# Patient Record
Sex: Female | Born: 1980
Health system: Southern US, Community
[De-identification: ages and names within clinical notes are randomized; demographics above are authoritative.]

## PROBLEM LIST (undated history)

## (undated) DIAGNOSIS — G35 Multiple sclerosis: Secondary | ICD-10-CM

## (undated) DIAGNOSIS — F418 Other specified anxiety disorders: Secondary | ICD-10-CM

## (undated) DIAGNOSIS — G43909 Migraine, unspecified, not intractable, without status migrainosus: Secondary | ICD-10-CM

## (undated) HISTORY — DX: Multiple sclerosis: G35

## (undated) HISTORY — DX: Migraine, unspecified, not intractable, without status migrainosus: G43.909

## (undated) HISTORY — DX: Other specified anxiety disorders: F41.8

---

## 1999-03-29 ENCOUNTER — Emergency Department (HOSPITAL_COMMUNITY): Admission: EM | Admit: 1999-03-29 | Discharge: 1999-03-29 | Payer: Self-pay | Admitting: Emergency Medicine

## 1999-10-27 ENCOUNTER — Emergency Department (HOSPITAL_COMMUNITY): Admission: EM | Admit: 1999-10-27 | Discharge: 1999-10-27 | Payer: Self-pay | Admitting: Emergency Medicine

## 1999-12-03 ENCOUNTER — Emergency Department (HOSPITAL_COMMUNITY): Admission: EM | Admit: 1999-12-03 | Discharge: 1999-12-03 | Payer: Self-pay | Admitting: Emergency Medicine

## 1999-12-12 ENCOUNTER — Encounter: Payer: Self-pay | Admitting: Orthopedic Surgery

## 1999-12-12 ENCOUNTER — Ambulatory Visit (HOSPITAL_COMMUNITY): Admission: RE | Admit: 1999-12-12 | Discharge: 1999-12-12 | Payer: Self-pay | Admitting: Orthopedic Surgery

## 2019-04-03 ENCOUNTER — Other Ambulatory Visit: Payer: Self-pay

## 2019-04-03 ENCOUNTER — Telehealth: Payer: Self-pay | Admitting: *Deleted

## 2019-04-03 ENCOUNTER — Encounter: Payer: Self-pay | Admitting: Neurology

## 2019-04-03 ENCOUNTER — Ambulatory Visit (INDEPENDENT_AMBULATORY_CARE_PROVIDER_SITE_OTHER): Payer: Medicare HMO | Admitting: Neurology

## 2019-04-03 VITALS — BP 105/70 | HR 56 | Ht 67.0 in | Wt 169.0 lb

## 2019-04-03 DIAGNOSIS — G35 Multiple sclerosis: Secondary | ICD-10-CM | POA: Diagnosis not present

## 2019-04-03 DIAGNOSIS — F329 Major depressive disorder, single episode, unspecified: Secondary | ICD-10-CM

## 2019-04-03 DIAGNOSIS — E559 Vitamin D deficiency, unspecified: Secondary | ICD-10-CM | POA: Diagnosis not present

## 2019-04-03 DIAGNOSIS — Z79899 Other long term (current) drug therapy: Secondary | ICD-10-CM | POA: Diagnosis not present

## 2019-04-03 DIAGNOSIS — F32A Depression, unspecified: Secondary | ICD-10-CM

## 2019-04-03 DIAGNOSIS — R260 Ataxic gait: Secondary | ICD-10-CM

## 2019-04-03 DIAGNOSIS — G4721 Circadian rhythm sleep disorder, delayed sleep phase type: Secondary | ICD-10-CM

## 2019-04-03 MED ORDER — GABAPENTIN 300 MG PO CAPS: 300.0000 mg | ORAL_CAPSULE | Freq: Three times a day (TID) | ORAL | 11 refills | Status: DC

## 2019-04-03 NOTE — Progress Notes (Addendum)
[facility]  PATIENT: Mary Jackson DOB: 15-May-1981  REFERRING DOCTOR OR PCP:  Dr. Lovenia Shuck SOURCE: Patient, extensive notes from neurology, imaging and laboratory reports, MRI images personally reviewed.  _________________________________   HISTORICAL  CHIEF COMPLAINT:  Chief Complaint  Patient presents with  . New Patient (Initial Visit)    RM 55 with sister, Mary Jackson (96.8) . Paper referral for MS. Transferring care from Dr. Kym Groom. Last Tysabri infusion 02/20/2019. Moved from [place]. Last MRI brain this year.   . Gait Problem    No falls in last year. Uses rolling walker.     HISTORY OF PRESENT ILLNESS:  I had the pleasure of seeing your patient, Mary Jackson, [facility] for neurologic consultation regarding her relapsing/active form of secondary progressive multiple sclerosis.  She is a 38 year old woman who was diagnosed with MS in 2008 after presenting with left leg weakness and clumsiness and a fall.  Imaging studies were performed and she was diagnosed.   She was initially placed on Copaxone but she had multiple relapses.   She was placed on Tysabri.  Her last infusion was 02/20/2019.  JCV antibody earlier this year was 0.3.  I did not see the inhibition assay.  Currently, she has leg weakness, left > right.   She uses a walker.    She takes Ampyra and that has helped her gait some.   She has ataxia in her legs.   Baclofen has helped her spasms and gabapentin has helped her dysesthesias (arms and legs but fluctuates).    Bladder function is fine.   She has reduced vision OD and colors are desaturated OD.    Sometimes she has diplopia.      She is sometimes fatigued.  She has trouble falling asleep.   She does not go to sleep until after 2 am or later.   She then wakes up at 6 am and then falls back asleep for a while.    She does not snore. She used to take medication at night to help sleep.    She has had some depression, helped by  sertraline.    She has seen psychology/counseling in the past for depression, stress management and other issues.     I personally reviewed MRIs of the brain dated 4/2 2019 and 04/09/2016.  The MRI showed multiple T2/flair hyperintense foci with single and confluent lesions in the periventricular, juxtacortical and deep white matter and also is a few foci in the infratentorial white matter and apparently at the cervical medullary junction.  We do not have an MRI of the cervical spine.  MRI report from 2015 did mention that there were new lesions compared to the previous MRI.  She has no FH of MS.   Parents have HTN.     REVIEW OF SYSTEMS: Constitutional: No fevers, chills, sweats, or change in appetite Eyes: No visual changes, double vision, eye pain Ear, nose and throat: No hearing loss, ear pain, nasal congestion, sore throat Cardiovascular: No chest pain, palpitations Respiratory: No shortness of breath at rest or with exertion.   No wheezes GastrointestinaI: No nausea, vomiting, diarrhea, abdominal pain, fecal incontinence Genitourinary: No dysuria, urinary retention or frequency.  No nocturia. Musculoskeletal: No neck pain, back pain Integumentary: No rash, pruritus, skin lesions Neurological: as above Psychiatric: No depression at this time.  No anxiety Endocrine: No palpitations, diaphoresis, change in appetite, change in weigh or increased thirst Hematologic/Lymphatic: No anemia, purpura, petechiae. Allergic/Immunologic: No itchy/runny eyes, nasal congestion, recent  allergic reactions, rashes  ALLERGIES: Not on File  HOME MEDICATIONS:  Current Outpatient Medications:  .  Ascorbic Acid (VITAMIN C PO), Take 1,000 mg by mouth daily., Disp: , Rfl:  .  baclofen (LIORESAL) 10 MG tablet, Take 10 mg by mouth 3 (three) times daily., Disp: , Rfl:  .  Cholecalciferol (VITAMIN D3 PO), Take 5,000 Units by mouth daily., Disp: , Rfl:  .  Cyanocobalamin (VITAMIN B-12 PO), Take 1,000 mcg by  mouth daily., Disp: , Rfl:  .  dalfampridine 10 MG TB12, Take 10 mg by mouth 2 (two) times daily., Disp: , Rfl:  .  ferrous sulfate 324 MG TBEC, Take 324 mg by mouth., Disp: , Rfl:  .  gabapentin (NEURONTIN) 100 MG capsule, Take 100 mg by mouth 2 (two) times daily., Disp: , Rfl:  .  ibuprofen (ADVIL) 800 MG tablet, Take 800 mg by mouth 2 (two) times daily., Disp: , Rfl:  .  sertraline (ZOLOFT) 100 MG tablet, Take 100 mg by mouth daily., Disp: , Rfl:  .  VITAMIN E PO, Take 400 Units by mouth daily., Disp: , Rfl:   PAST MEDICAL HISTORY: No past medical history on file.  PAST SURGICAL HISTORY:   FAMILY HISTORY: No family history on file.  SOCIAL HISTORY:  Social History   Socioeconomic History  . Marital status: Single    Spouse name: Not on file  . Number of children: Not on file  . Years of education: Not on file  . Highest education level: Not on file  Occupational History  . Not on file  Social Needs  . Financial resource strain: Not on file  . Food insecurity    Worry: Not on file    Inability: Not on file  . Transportation needs    Medical: Not on file    Non-medical: Not on file  Tobacco Use  . Smoking status: Current Every Day Smoker    Types: Cigarettes  . Smokeless tobacco: Never Used  . Tobacco comment: 3-5 cigs per day  Substance and Sexual Activity  . Alcohol use: Yes    Frequency: Never    Comment: sometimes   . Drug use: Yes    Types: Marijuana  . Sexual activity: Not on file  Lifestyle  . Physical activity    Days per week: Not on file    Minutes per session: Not on file  . Stress: Not on file  Relationships  . Social Herbalist on phone: Not on file    Gets together: Not on file    Attends religious service: Not on file    Active member of club or organization: Not on file    Attends meetings of clubs or organizations: Not on file    Relationship status: Not on file  . Intimate partner violence    Fear of current or ex partner:  Not on file    Emotionally abused: Not on file    Physically abused: Not on file    Forced sexual activity: Not on file  Other Topics Concern  . Not on file  Social History Narrative   Right handed   Caffeine use: coffee, tea, soda   Lives with sister, Mary Jackson     PHYSICAL EXAM  Vitals:   04/03/19 0854  BP: 105/70  Pulse: (!) 56  Weight: 169 lb (76.7 kg)  Height: 5\' 7"  (1.702 m)    Body mass index is 26.47 kg/m.   General: The patient is well-developed  and well-nourished and in no acute distress  HEENT:  Head is [place]/AT.  Sclera are anicteric.  Funduscopic exam shows normal optic discs and retinal vessels.  Neck: No carotid bruits are noted.  The neck is nontender.  Cardiovascular: The heart has a regular rate and rhythm with a normal S1 and S2. There were no murmurs, gallops or rubs.    Skin: Extremities are without rash or  edema.  Musculoskeletal:  Back is nontender  Neurologic Exam  Mental status: The patient is alert and oriented x 3 at the time of the examination. The patient has apparent normal recent and remote memory, with an apparently normal attention span and concentration ability.   Speech is normal.  Cranial nerves: Extraocular movements show nystagmus on lateral gaze with slight disconjugate gaze looking right..  She has a 1+ a current pupillary defect and colors are desaturated OD facial symmetry is present. There is good facial sensation to soft touch bilaterally.Facial strength is normal.  Trapezius and sternocleidomastoid strength is normal. No dysarthria is noted.    No obvious hearing deficits are noted.  Motor:  Muscle bulk is normal.   Tone is normal.  Strength is 5/5 in the arms, 4/5 proximally in the legs and in the feet and ankles 4+/5 the gastrocnemius muscles.     Sensory: Sensory testing is intact to pinprick, soft touch and vibration sensation in all 4 extremities.  Coordination: Cerebellar testing reveals mildly reduced left  finger-nose-finger and mildly reduced right and moderately reduced left heel-to-shin .  Gait and station: Station is normal but she needs to use her hands to get out of a chair.  Gait is ataxic and she uses a walker.  She cannot tandem walk.  Romberg is positive.   Reflexes: Deep tendon reflexes are mildly increased in the arms, left greater than right.  Reflexes are increased with crossed abductor responses at the knees, left more than right.  2 beats of nonsustained ankle clonus on the left..   Plantar responses are flexor.      ASSESSMENT AND PLAN  Multiple sclerosis ([place]) - Plan: Stratify JCV Antibody Test ([facility]), Ambulatory referral to Physical Therapy, Ambulatory referral to Behavioral Health  High risk medication use - Plan: Stratify JCV Antibody Test ([facility])  Vitamin D deficiency - Plan: VITAMIN D 25 Hydroxy (Vit-D Deficiency, Fractures)  Depression, unspecified depression type - Plan: Ambulatory referral to Behavioral Health  Ataxic gait - Plan: Ambulatory referral to Physical Therapy     In summary, Ms. Herriage is a 38 year old woman with a relapsing/active form of  SP multiple sclerosis diagnosed in 2008.  She has extensive demyelinating changes in the brain and at the cervical medullary junction.  She likely has foci in the spinal cord as well.  She feels she has been mostly stable on Tysabri and we will continue this.  She signed a new service request form and we will try to pull her over into our site as quickly as possible.    Her main impairment is ataxic gait.  She gets some benefit from [place].  For the most part she feels stable.  I will have her see physical therapy to see if gait can improve some.    A second problem is depression with some anxiety and issues related to stress.  She sleeps poorly.  She will be referred to behavioral health and I will continue the sertraline.     An additional problem is delayed phase sleep syndrome.  We discussed good  sleep hygiene.   I will have her try to move her bedtime up to midnight 15 to 30 minutes a day by taking melatonin 1 hour before bedtime and reinforcing the circadian rhythm with bright lights in the morning.  Additionally she will move her last gabapentin to 1 hour before bedtime.  She will return to see [place] in about 6 months or sooner if there are new or worsening neurologic symptoms.    Thank you for asking [place] to see Ms. Car.  Please let me know if I can be of further assistance with her or other patients in the future.     A. Felecia Shelling, MD, [facility] [id number], [id number] Certified in Neurology, Clinical Neurophysiology, Sleep Medicine and Neuroimaging  [facility] [address], [place], [place] [address] [phone]

## 2019-04-03 NOTE — Telephone Encounter (Signed)
Placed JCV lab in [facility] lock box for routine lab pick up. Results pending. 

## 2019-04-04 ENCOUNTER — Telehealth: Payer: Self-pay | Admitting: *Deleted

## 2019-04-04 LAB — VITAMIN D 25 HYDROXY (VIT D DEFICIENCY, FRACTURES): Vit D, 25-Hydroxy: 54.3 ng/mL (ref 30.0–100.0)

## 2019-04-04 NOTE — Telephone Encounter (Signed)
Called, LVM for pt to call office about results.  Can relay Vitamin D level was fine per Dr. Felecia Shelling if she calls back, thank you

## 2019-04-04 NOTE — Telephone Encounter (Signed)
-----   Message from Britt Bottom, MD sent at 04/04/2019  9:18 AM EDT ----- Please let the patient know that the vitamin D was fine

## 2019-04-04 NOTE — Telephone Encounter (Signed)
Faxed completed/signed Tysabri start form to [place] at [phone]. Received fax confirmation. Gave completed form to [facility] to start processing for pt and sent copy to be scanned to epic.

## 2019-04-04 NOTE — Telephone Encounter (Signed)
Spoke with the patient and she verbalized understanding her results. No other questions or concerns at this time.  

## 2019-04-10 NOTE — Telephone Encounter (Signed)
Called [facility] and requested results to be faxed to [phone]. Have not received results. Waiting on results.

## 2019-04-12 NOTE — Telephone Encounter (Signed)
Pt's sister Rollene Fare, on [place] called stating that she received a letter of the approval dated the 24th of September. She would like to know what the next step is to get this medication for the pt. Please advise.

## 2019-04-12 NOTE — Telephone Encounter (Signed)
Printed message and gave to [facility] to contact pt back.

## 2019-04-17 NOTE — Telephone Encounter (Signed)
Called [facility] again because we had not received results. Spoke with Caryl Pina. She faxed results to [phone]. JCV ab drawn on 04/03/19 indeterminate, index: 0.38. Inhibition assay: negative.  Gave copy to [facility] as requested.

## 2019-04-19 ENCOUNTER — Other Ambulatory Visit: Payer: Self-pay | Admitting: *Deleted

## 2019-04-19 NOTE — Progress Notes (Signed)
Also received request for gentamicin and econazole cream but Dr. Felecia Shelling does not prescribe these.

## 2019-04-19 NOTE — Progress Notes (Signed)
We can refill Ibuprofen but I don;t have experience with skin creams and she should get PCP or derm to refill

## 2019-04-20 ENCOUNTER — Telehealth: Payer: Self-pay | Admitting: *Deleted

## 2019-04-20 MED ORDER — GABAPENTIN 300 MG PO CAPS: 300.0000 mg | ORAL_CAPSULE | Freq: Three times a day (TID) | ORAL | 3 refills | Status: DC

## 2019-04-20 MED ORDER — IBUPROFEN 800 MG PO TABS: 800.0000 mg | ORAL_TABLET | Freq: Two times a day (BID) | ORAL | 1 refills | Status: DC

## 2019-04-20 NOTE — Telephone Encounter (Signed)
Received request from [facility] to fill Baclofen 10 mg, Sertraline 100 mg, and Diflorasone 0.05% topical ointment. Dr. Felecia Shelling refilled Baclofen 10 mg, take 1 tablet PO TID #270 refills 4 and Sertraline 100 mg, take 1 tablet PO Daily #90 refills 4. He declined the ointment. I faxed the orders back to [facility]. Received a receipt of confirmation.

## 2019-04-20 NOTE — Progress Notes (Signed)
Called, LVM for pt letting her know we refilled gabapentin, ibuprofen to [facility]. Advised her to contact PCP to request refill on gentamicin and econazole cream. Dr. Felecia Shelling does not manage these type of medications. Gave [facility] phone number if she has further questions.

## 2019-04-21 ENCOUNTER — Ambulatory Visit: Payer: Medicare HMO | Attending: Neurology | Admitting: Physical Therapy

## 2019-04-21 ENCOUNTER — Encounter: Payer: Self-pay | Admitting: Physical Therapy

## 2019-04-21 ENCOUNTER — Other Ambulatory Visit: Payer: Self-pay

## 2019-04-21 DIAGNOSIS — R262 Difficulty in walking, not elsewhere classified: Secondary | ICD-10-CM | POA: Insufficient documentation

## 2019-04-21 DIAGNOSIS — R2689 Other abnormalities of gait and mobility: Secondary | ICD-10-CM | POA: Insufficient documentation

## 2019-04-21 DIAGNOSIS — M6281 Muscle weakness (generalized): Secondary | ICD-10-CM | POA: Diagnosis not present

## 2019-04-21 DIAGNOSIS — R29818 Other symptoms and signs involving the nervous system: Secondary | ICD-10-CM | POA: Diagnosis not present

## 2019-04-23 NOTE — Therapy (Signed)
[facility] [address] [place], [place], [address] Phone: [phone]   Fax:  [phone]  Physical Therapy Evaluation  Patient Details  Name: Mary Jackson MRN: [id number] Date of Birth: 1981/01/31 Referring Provider (PT): Arlice Colt, MD   Encounter Date: 04/21/2019     04/23/19 2035  PT Visits / Re-Eval  Visit Number 1  Number of Visits 17  Date for PT Re-Evaluation 07/22/19  Authorization  Authorization Type [employer] - $40, currently on hold due to co-pay  PT Time Calculation  PT Start Time 1106  PT Stop Time 1150  PT Time Calculation (min) 44 min  PT - End of Session  Equipment Utilized During Treatment Gait belt  Activity Tolerance Patient tolerated treatment well  Behavior During Therapy [facility] for tasks assessed/performed    History reviewed. No pertinent past medical history.  History reviewed. No pertinent surgical history.  There were no vitals filed for this visit.       04/21/19 1111  Symptoms/Limitations  Subjective Hasn't had physical therapy in a while - been a couple of years.  Now living with her sister in [place] - just moved from [place] in September. Pt was diagnosed with MS in 2008. Left leg is the one that bothers her the most, notices that she drops objects with her left hand. No recent falls - last fall was in June (approx) after pt was doing too much. Has been using a rollator since approx. 2013.  Patient is accompained by: Family member (sister in lobby)  Pertinent History MS, depression  Limitations Standing;Walking  How long can you walk comfortably? A little over 10 minutes   Patient Stated Goals wants to walk without a rollator  Pain Assessment  Currently in Pain? Yes  Pain Score 6  Pain Location  (right low back)  Pain Orientation Right             04/21/19 1119  Assessment  Medical Diagnosis MS  Referring Provider (PT) Arlice Colt, MD  Onset  Date/Surgical Date  (diagnosed with MS in 2008)  Hand Dominance Right  Prior Therapy recently PT a couple years ago when she lived in [place], and previously had OT  Precautions  Precautions Fall  Required Braces or Orthoses Other Brace/Splint  Other Brace/Splint used to have a R AFO, but pt does not have it anymore  Balance Screen  Has the patient fallen in the past 6 months Yes  How many times? 1 (in her apartment)  Has the patient had a decrease in activity level because of a fear of falling?  No  Is the patient reluctant to leave their home because of a fear of falling?  No  Home Environment  Living Environment Private residence  Living Arrangements Other relatives (sister and sister's niece and nephew)  Available Help at Discharge Family;Other (Comment) (sister, Rollene Fare )  Type of [place] to enter  Entrance Stairs-Number of Steps 10 (or more, pt unsure)  Entrance Stairs-Rails Can reach both  Home Layout One level  Home Equipment Other (comment);Wheelchair - manual (rollator, w/c in storage )  Additional Comments Sister helps cooking. Has wheelchair for longer distances, but w/c is in storage right now.   Prior Function  Level of Independence Requires assistive device for independence;Independent with basic ADLs  Leisure likes walking, exercising   Sensation  Light Touch Appears Intact (takes increased time to respond)  Coordination  Gross Motor Movements are Fluid and Coordinated No  Fine Motor Movements are Fluid and Coordinated No  Coordination and Movement Description overshoots/undershoots B with finger to nose test   Heel Shin Test dysmetric B  Posture/Postural Control  Posture/Postural Control Postural limitations  Postural Limitations Flexed trunk;Forward head  ROM / Strength  AROM / PROM / Strength Strength  Strength  Strength Assessment Site Hip;Knee;Ankle  Right/Left Hip Right;Left  Right/Left Knee Right;Left  Right/Left Ankle  Right;Left  Right Hip Flexion 3-/5 (leans trunk posteriorly when performing)  Left Hip Flexion 3-/5 (substitutes with hip ER)  Right Knee Flexion 5/5  Right Knee Extension 5/5  Left Knee Flexion 4/5  Left Knee Extension 5/5  Right Ankle Dorsiflexion 5/5  Left Ankle Dorsiflexion 4+/5  Transfers  Transfers Sit to Stand;Stand to Sit  Sit to Stand 5: Supervision  Five time sit to stand comments  19.44 seconds from low blue mat table with BUE support  Ambulation/Gait  Ambulation/Gait Yes  Ambulation/Gait Assistance 5: Supervision;4: Min guard  Ambulation/Gait Assistance Details Pt with increased R circumduction to clear RLE, needs intermittent cues to remain close to rollator, especially during turns.  Ambulation Distance (Feet) 115 Feet  Assistive device Rollator  Gait Pattern Step-through pattern;Step-to pattern;Decreased stance time - left;Decreased hip/knee flexion - right;Decreased hip/knee flexion - left;Decreased dorsiflexion - right;Decreased dorsiflexion - left;Right circumduction;Left foot flat;Right foot flat;Lateral hip instability;Wide base of support;Trunk flexed;Poor foot clearance - left;Poor foot clearance - right  Ambulation Surface Level;Indoor  Gait velocity 25.5 seconds = 1.29 ft/sec  Standardized Balance Assessment  Standardized Balance Assessment TUG  Timed Up and Go Test  Normal TUG (seconds) 30.94 (with rollator)       Objective measurements completed on examination: See above findings.               04/23/19 2033  PT Education  Education Details clinical findings, POC  Person(s) Educated Patient  Methods Explanation  Comprehension Verbalized understanding      PT Short Term Goals - 04/24/19 1736      PT SHORT TERM GOAL #1   Title  Patient will be independent with initial HEP in order to build upon functional gains made in therapy. ALL STGS DUE 06/23/19    Time  8   goals written for 8 weeks out due to scheduling   Period  Weeks     Status  New    Target Date  06/23/19      PT SHORT TERM GOAL #2   Title  Patient will improve baseline BERG score by at least 4 points in order to demo decreased fall risk.    Baseline  Not yet assessed.    Time  8    Period  Weeks    Status  New      PT SHORT TERM GOAL #3   Title  Patient will ambulate at least 97' with rollator and supervision in order to improve household and community mobility.    Time  8    Period  Weeks    Status  New      PT SHORT TERM GOAL #4   Title  Patient will decrease TUG time to 24 seconds or less in order to demo decreased fall risk.    Baseline  30.94 seconds with rollator    Time  8    Period  Weeks    Status  New      PT SHORT TERM GOAL #5   Title  Patient will decrease 5x sit <> stand score to 17 seconds  or less from low blue mat table with BUE support in order to demo improved LE strength    Baseline  19.44 seconds on 04/24/19    Time  8    Period  Weeks    Status  New      PT Long Term Goals - 04/24/19 1740      PT LONG TERM GOAL #1   Title  Patient will be independent with final HEP in order to build upon functional gains made in therapy. ALL LTGS DUE 07/23/2019    Time  12   written 12 weeks out due to scheduling   Period  Weeks    Status  New    Target Date  07/23/19      PT LONG TERM GOAL #2   Title  Patient will ambulate at least 300' with rollator on outdoor surfaces and supervision in order to improve community mobility    Time  12    Period  Weeks    Status  New      PT LONG TERM GOAL #3   Title  Patient will improve gait speed with rollator to at least 2.3 ft/sec in order to demo decreased fall risk.    Baseline  1.29 ft/sec    Time  12    Period  Weeks    Status  New      PT LONG TERM GOAL #4   Title  Patient will decrease 5x sit <> stand score to 17 seconds or less from low blue mat table with no UE support in order to demo improved LE strength    Time  12    Period  Weeks    Status  New      PT LONG TERM  GOAL #5   Title  Patient will decrease TUG time to 18 seconds or less in order to demo decreased fall risk.    Time  12    Period  Weeks    Status  New      Additional Long Term Goals   Additional Long Term Goals  Yes      PT LONG TERM GOAL #6   Title  Patient will improve baseline BERG score by at least 7 points to demo decreased fall risk.    Time  12    Period  Weeks    Status  New        04/24/19 1729  Plan  Clinical Impression Statement Patient is a 38 year old female referred to Neuro OPPT for evaluation with primary concern of MS (diagnosed 2008) and gait abnormalities.  Pt's PMH is significant for: MS, depression. The following deficits were present during the exam: decreased coordination, decreased endurance, decreased LE strength, impaired balance, gait abnormalities. Pt's TUG and gait speed scores indicate pt is at a high risk for falls. Due to pt's co-pay, pt will be put on hold. Discussed with pt's sister who brought pt to appointment - they applied for Medicaid and should be hearing back soon. Will call back once they find out about scheduling appointments. Pt would benefit from skilled PT to address these impairments and functional limitations to maximize functional mobility independence and decreasing fall risk.  Personal Factors and Comorbidities Age;Comorbidity 2;Finances;Past/Current Experience;Time since onset of injury/illness/exacerbation  Comorbidities MS, depression  Examination-Activity Limitations Locomotion Level;Stand;Squat;Transfers  Examination-Participation Restrictions Community Activity;Cleaning  Pt will benefit from skilled therapeutic intervention in order to improve on the following deficits Decreased activity tolerance;Abnormal gait;Decreased balance;Decreased coordination;Decreased endurance;Decreased  mobility;Decreased range of motion;Difficulty walking;Decreased strength  Stability/Clinical Decision Making Stable/Uncomplicated  Clinical Decision  Making Low  Rehab Potential Good  PT Frequency 2x / week  PT Duration 8 weeks  PT Treatment/Interventions ADLs/Self Care Home Management;Aquatic Therapy;DME Instruction;Gait training;Stair training;Therapeutic activities;Functional mobility training;Therapeutic exercise;Balance training;Neuromuscular re-education;Orthotic Fit/Training;Patient/family education;Passive range of motion;Energy conservation  PT Next Visit Plan assess BERG, initial HEP for LE strengthening, ROM and balance.  Recommended Other Services OT  Consulted and Agree with Plan of Care Patient                 Patient will benefit from skilled therapeutic intervention in order to improve the following deficits and impairments:     Visit Diagnosis: Difficulty in walking, not elsewhere classified Other abnormalities of gait and mobility Muscle weakness (generalized) Other symptoms and signs involving the nervous system       Problem List Patient Active Problem List   Diagnosis Date Noted  . Multiple sclerosis ([place]) 04/03/2019  . Vitamin D deficiency 04/03/2019  . Depression 04/03/2019  . Ataxic gait 04/03/2019  . High risk medication use 04/03/2019  . Delayed sleep phase syndrome 04/03/2019    Arliss Journey, PT, DPT  04/23/2019, 8:32 PM  [facility] [address] [place], [place], [address] Phone: [phone]   Fax:  [phone]  Name: Samani Szarka MRN: [id number] Date of Birth: 1981-04-09

## 2019-04-24 DIAGNOSIS — G35 Multiple sclerosis: Secondary | ICD-10-CM | POA: Diagnosis not present

## 2019-05-03 ENCOUNTER — Ambulatory Visit: Payer: Medicare HMO | Admitting: Psychology

## 2019-05-22 DIAGNOSIS — G35 Multiple sclerosis: Secondary | ICD-10-CM | POA: Diagnosis not present

## 2019-06-19 DIAGNOSIS — G35 Multiple sclerosis: Secondary | ICD-10-CM | POA: Diagnosis not present

## 2019-07-04 ENCOUNTER — Telehealth: Payer: Self-pay | Admitting: Neurology

## 2019-07-04 DIAGNOSIS — G35 Multiple sclerosis: Secondary | ICD-10-CM

## 2019-07-04 DIAGNOSIS — R260 Ataxic gait: Secondary | ICD-10-CM

## 2019-07-04 NOTE — Telephone Encounter (Signed)
Patient sister Rollene Fare called stating that the patient is needing a prescription for a walker. Please follow up.

## 2019-07-04 NOTE — Telephone Encounter (Signed)
Called and spoke with [place] (on [place]). She states pt has rolling walker but it broke. She is needing a new one. They will pick up rx for this today. Aware Dr. Felecia Shelling out of the office and Dr. Rexene Alberts covering. I placed rx up front for pick up.

## 2019-07-04 NOTE — Telephone Encounter (Signed)
Dr. Rexene Alberts, are you ok if I print rx for walker? This is a Dr. Felecia Shelling pt.

## 2019-07-04 NOTE — Addendum Note (Signed)
Addended by: Hope Pigeon on: 07/04/2019 02:45 PM   Modules accepted: Orders

## 2019-07-04 NOTE — Telephone Encounter (Signed)
Dr. Garth Bigness office note from September indicated that patient was using a rolling walker. Why does she need another one?  If she needs a new/replacement walker, okay to put in order and sent to her DME company.

## 2019-07-11 NOTE — Telephone Encounter (Signed)
Rollator w/ seat prescription faxed and confirmed to [facility] at [phone].

## 2019-07-11 NOTE — Telephone Encounter (Signed)
New rx printed and signed.  I need to get the name of the DME company they are using for the walker.  I have left a message requesting the patient's sister to call back with this information.  Once I have the name of the company and can confirm where we are sending the prescription, I can fax it to the number below.

## 2019-07-11 NOTE — Telephone Encounter (Signed)
DME Company : [facility]

## 2019-07-11 NOTE — Addendum Note (Signed)
Addended by: Noberto Retort C on: 07/11/2019 11:43 AM   Modules accepted: Orders

## 2019-07-11 NOTE — Telephone Encounter (Signed)
Mary Jackson ( on [place])  called in and stated the script needs to state rollator walker  with a seat . In order to get the correct walker.  FAX# [phone]

## 2019-07-12 DIAGNOSIS — R260 Ataxic gait: Secondary | ICD-10-CM | POA: Diagnosis not present

## 2019-07-12 DIAGNOSIS — G35 Multiple sclerosis: Secondary | ICD-10-CM | POA: Diagnosis not present

## 2019-07-17 DIAGNOSIS — G35 Multiple sclerosis: Secondary | ICD-10-CM | POA: Diagnosis not present

## 2019-08-14 DIAGNOSIS — G35 Multiple sclerosis: Secondary | ICD-10-CM | POA: Diagnosis not present

## 2019-08-30 ENCOUNTER — Telehealth: Payer: Self-pay | Admitting: *Deleted

## 2019-08-30 NOTE — Telephone Encounter (Signed)
Faxed completed/signed [facility] pt status report and reauth questionnaire to [facility] at [phone]. Received confirmation.  

## 2019-09-04 NOTE — Telephone Encounter (Signed)
Patient authorization has been approved through 03-21-2020.

## 2019-09-11 DIAGNOSIS — G35 Multiple sclerosis: Secondary | ICD-10-CM | POA: Diagnosis not present

## 2019-09-15 ENCOUNTER — Other Ambulatory Visit: Payer: Self-pay | Admitting: Neurology

## 2019-10-02 ENCOUNTER — Encounter: Payer: Self-pay | Admitting: Family Medicine

## 2019-10-02 ENCOUNTER — Ambulatory Visit: Payer: Medicare HMO | Admitting: Family Medicine

## 2019-10-02 ENCOUNTER — Other Ambulatory Visit: Payer: Self-pay

## 2019-10-02 VITALS — BP 115/66 | HR 65 | Temp 97.5°F | Ht 67.0 in | Wt 173.0 lb

## 2019-10-02 DIAGNOSIS — G4721 Circadian rhythm sleep disorder, delayed sleep phase type: Secondary | ICD-10-CM | POA: Diagnosis not present

## 2019-10-02 DIAGNOSIS — E559 Vitamin D deficiency, unspecified: Secondary | ICD-10-CM | POA: Diagnosis not present

## 2019-10-02 DIAGNOSIS — F329 Major depressive disorder, single episode, unspecified: Secondary | ICD-10-CM | POA: Diagnosis not present

## 2019-10-02 DIAGNOSIS — R260 Ataxic gait: Secondary | ICD-10-CM | POA: Diagnosis not present

## 2019-10-02 DIAGNOSIS — G35 Multiple sclerosis: Secondary | ICD-10-CM

## 2019-10-02 DIAGNOSIS — Z79899 Other long term (current) drug therapy: Secondary | ICD-10-CM | POA: Diagnosis not present

## 2019-10-02 DIAGNOSIS — F32A Depression, unspecified: Secondary | ICD-10-CM

## 2019-10-02 NOTE — Progress Notes (Signed)
She followed up for her relapsing form of secondary progressive multiple sclerosis.  I have read the note, and otherwise agree with the clinical assessment and plan.  Marilla Boddy A. Felecia Shelling, MD, PhD, [facility] Certified in Neurology, Clinical Neurophysiology, Sleep Medicine, Pain Medicine and Neuroimaging  [facility] [address], [place], [place] [address] [phone]

## 2019-10-02 NOTE — Patient Instructions (Signed)
Continue current treatment plan.   We will update labs today   Follow up in 6 months with Dr Felecia Shelling   Multiple Sclerosis Multiple sclerosis (MS) is a disease of the brain, spinal cord, and optic nerves (central nervous system). It causes the body's disease-fighting (immune) system to destroy the protective covering (myelin sheath) around nerves in the brain. When this happens, signals (nerve impulses) going to and from the brain and spinal cord do not get sent properly or may not get sent at all. There are several types of MS:  Relapsing-remitting MS. This is the most common type. This causes sudden attacks of symptoms. After an attack, you may recover completely until the next attack, or some symptoms may remain permanently.  Secondary progressive MS. This usually develops after the onset of relapsing-remitting MS. Similar to relapsing-remitting MS, this type also causes sudden attacks of symptoms. Attacks may be less frequent, but symptoms slowly get worse (progress) over time.  Primary progressive MS. This causes symptoms that steadily progress over time. This type of MS does not cause sudden attacks of symptoms. The age of onset of MS varies, but it often develops between 22-67 years of age. MS is a lifelong (chronic) condition. There is no cure, but treatment can help slow down the progression of the disease. What are the causes? The cause of this condition is not known. What increases the risk? You are more likely to develop this condition if:  You are a woman.  You have a relative with MS. However, the condition is not passed from parent to child (inherited).  You have a lack (deficiency) of vitamin D.  You smoke. MS is more common in the [place] than in the [place]. What are the signs or symptoms? Relapsing-remitting and secondary progressive MS cause symptoms to occur in episodes or attacks that may last weeks to months. There may be long periods  between attacks in which there are almost no symptoms. Primary progressive MS causes symptoms to steadily progress after they develop. Symptoms of MS vary because of the many different ways it affects the central nervous system. The main symptoms include:  Vision problems and eye pain.  Numbness.  Weakness.  Inability to move your arms, hands, feet, or legs (paralysis).  Balance problems.  Shaking that you cannot control (tremors).  Muscle spasms.  Problems with thinking (cognitive changes). MS can also cause symptoms that are associated with the disease, but are not always the direct result of an MS attack. They may include:  Inability to control urination or bowel movements (incontinence).  Headaches.  Fatigue.  Inability to tolerate heat.  Emotional changes.  Depression.  Pain. How is this diagnosed? This condition is diagnosed based on:  Your symptoms.  A neurological exam. This involves checking central nervous system function, such as nerve function, reflexes, and coordination.  MRIs of the brain and spinal cord.  Lab tests, including a lumbar puncture that tests the fluid that surrounds the brain and spinal cord (cerebrospinal fluid).  Tests to measure the electrical activity of the brain in response to stimulation (evoked potentials). How is this treated? There is no cure for MS, but medicines can help decrease the number and frequency of attacks and help relieve nuisance symptoms. Treatment options may include:  Medicines that reduce the frequency of attacks. These medicines may be given by injection, by mouth (orally), or through an IV.  Medicines that reduce inflammation (steroids). These may provide short-term relief of symptoms.  Medicines to help control pain, depression, fatigue, or incontinence.  Vitamin D, if you have a deficiency.  Using devices to help you move around (assistive devices), such as braces, a cane, or a walker.  Physical  therapy to strengthen and stretch your muscles.  Occupational therapy to help you with everyday tasks.  Alternative or complementary treatments such as exercise, massage, or acupuncture. Follow these instructions at home:  Take over-the-counter and prescription medicines only as told by your health care provider.  Do not drive or use heavy machinery while taking prescription pain medicine.  Use assistive devices as recommended by your physical therapist or your health care provider.  Exercise as directed by your health care provider.  Return to your normal activities as told by your health care provider. Ask your health care provider what activities are safe for you.  Reach out for support. Share your feelings with friends, family, or a support group.  Keep all follow-up visits as told by your health care provider and therapists. This is important. Where to find more information  National Multiple Sclerosis Society: https://www.nationalmssociety.org Contact a health care provider if:  You feel depressed.  You develop new pain or numbness.  You have tremors.  You have problems with sexual function. Get help right away if:  You develop paralysis.  You develop numbness.  You have problems with your bladder or bowel function.  You develop double vision.  You lose vision in one or both eyes.  You develop suicidal thoughts.  You develop severe confusion. If you ever feel like you may hurt yourself or others, or have thoughts about taking your own life, get help right away. You can go to your nearest emergency department or call:  Your local emergency services (911 in the [place]  A suicide crisis helpline, such as the [facility] at 575-690-9330. This is open 24 hours a day. Summary  Multiple sclerosis (MS) is a disease of the central nervous system that causes the body's immune system to destroy the protective covering (myelin sheath) around  nerves in the brain.  There are 3 types of MS: relapsing-remitting, secondary progressive, and primary progressive. Relapsing-remitting and secondary progressive MS cause symptoms to occur in episodes or attacks that may last weeks to months. Primary progressive MS causes symptoms to steadily progress after they develop.  There is no cure for MS, but medicines can help decrease the number and frequency of attacks and help relieve nuisance symptoms. Treatment may also include physical or occupational therapy.  If you develop numbness, paralysis, vision problems, or other neurological symptoms, get help right away. This information is not intended to replace advice given to you by your health care provider. Make sure you discuss any questions you have with your health care provider. Document Revised: 06/11/2017 Document Reviewed: 09/07/2016 Elsevier Patient Education  2020 [employer].

## 2019-10-02 NOTE — Progress Notes (Signed)
PATIENT: Mary Jackson DOB: May 14, 1981  REASON FOR VISIT: follow up HISTORY FROM: patient  Chief Complaint  Patient presents with  . Follow-up    Rm1. alone 67mo f/u. Would like refill. No questions nor concerns.     HISTORY OF PRESENT ILLNESS: Today 10/02/19 Mary Jackson is a 39 y.o. female here today for follow up for secondary progressive MS. She continues Tysabri infusions. She feels that, overall, MS symptoms are stable. Last infusion was first of March. She feels that gait is stable. No falls. She uses her Rolator all the time. She continues Ampyra twice daily. She had one PT session. She has not returned due to financing. No recent falls. She feels that gabapentin 300mg  TID helps with dysesthesias. She continues baclofen as needed. She usually takes two tablets at a time when muscle spasms in calves are bad. She denies adverse effects. She feels that mood is stable. She is now living in her home with her great niece. She continues sertraline 100mg  daily. She does feel that she needs to see behavioral health at this time. She feels that she is sleeping better. She tried melatonin for a little bit but feels that she did not need it any longer. She is taking vitamin D supplements. She was started on 5,000iu daily but reports that her sister bought the wrong dose last month and she has been taking 2000iu for the past month.  Last MRI 10/2017.    HISTORY: (copied from Dr Garth Bigness note on 04/03/2019)  I had the pleasure of seeing your patient, Mary Jackson, [facility] for neurologic consultation regarding her relapsing/active form of secondary progressive multiple sclerosis.  She is a 39 year old woman who was diagnosed with MS in 2008 after presenting with left leg weakness and clumsiness and a fall.  Imaging studies were performed and she was diagnosed.   She was initially placed on Copaxone but she had multiple relapses.   She was placed on Tysabri.  Her last  infusion was 02/20/2019.  JCV antibody earlier this year was 0.3.  I did not see the inhibition assay.  Currently, she has leg weakness, left > right.   She uses a walker.    She takes Ampyra and that has helped her gait some.   She has ataxia in her legs.   Baclofen has helped her spasms and gabapentin has helped her dysesthesias (arms and legs but fluctuates).    Bladder function is fine.   She has reduced vision OD and colors are desaturated OD.    Sometimes she has diplopia.      She is sometimes fatigued.  She has trouble falling asleep.   She does not go to sleep until after 2 am or later.   She then wakes up at 6 am and then falls back asleep for a while.    She does not snore. She used to take medication at night to help sleep.    She has had some depression, helped by sertraline.    She has seen psychology/counseling in the past for depression, stress management and other issues.     I personally reviewed MRIs of the brain dated 4/2 2019 and 04/09/2016.  The MRI showed multiple T2/flair hyperintense foci with single and confluent lesions in the periventricular, juxtacortical and deep white matter and also is a few foci in the infratentorial white matter and apparently at the cervical medullary junction.  We do not have an MRI of the cervical spine.  MRI  report from 2015 did mention that there were new lesions compared to the previous MRI.  She has no FH of MS.   Parents have HTN.     REVIEW OF SYSTEMS: Out of a complete 14 system review of symptoms, the patient complains only of the following symptoms, chronic pain, numbness and tingling, depression, imbalance and all other reviewed systems are negative.  ALLERGIES: No Known Allergies  HOME MEDICATIONS: Outpatient Medications Prior to Visit  Medication Sig Dispense Refill  . Ascorbic Acid (VITAMIN C PO) Take 1,000 mg by mouth daily.    . baclofen (LIORESAL) 10 MG tablet Take 10 mg by mouth 3 (three) times daily.    . Cholecalciferol  (VITAMIN D3 PO) Take 5,000 Units by mouth daily.    . Cyanocobalamin (VITAMIN B-12 PO) Take 1,000 mcg by mouth daily.    Marland Kitchen dalfampridine 10 MG TB12 Take 10 mg by mouth 2 (two) times daily.    . ferrous sulfate 324 MG TBEC Take 324 mg by mouth.    . gabapentin (NEURONTIN) 300 MG capsule Take 1 capsule (300 mg total) by mouth 3 (three) times daily. 270 capsule 3  . IBU 800 MG tablet TAKE 1 TABLET TWICE DAILY 180 tablet 1  . natalizumab (TYSABRI) 300 MG/15ML injection Inject into the vein.    Marland Kitchen sertraline (ZOLOFT) 100 MG tablet Take 100 mg by mouth daily.    Marland Kitchen VITAMIN E PO Take 400 Units by mouth daily.     No facility-administered medications prior to visit.    PAST MEDICAL HISTORY: No past medical history on file.  PAST SURGICAL HISTORY: No past surgical history on file.  FAMILY HISTORY: No family history on file.  SOCIAL HISTORY: Social History   Socioeconomic History  . Marital status: Single    Spouse name: Not on file  . Number of children: Not on file  . Years of education: Not on file  . Highest education level: Not on file  Occupational History  . Not on file  Tobacco Use  . Smoking status: Current Every Day Smoker    Types: Cigarettes  . Smokeless tobacco: Never Used  . Tobacco comment: 3-5 cigs per day  Substance and Sexual Activity  . Alcohol use: Yes    Comment: sometimes   . Drug use: Yes    Types: Marijuana  . Sexual activity: Not on file  Other Topics Concern  . Not on file  Social History Narrative   Right handed   Caffeine use: coffee, tea, soda   Lives with sister, Rollene Fare   Social Determinants of Health   Financial Resource Strain:   . Difficulty of Paying Living Expenses:   Food Insecurity:   . Worried About Charity fundraiser in the Last Year:   . Arboriculturist in the Last Year:   Transportation Needs:   . Film/video editor (Medical):   Marland Kitchen Lack of Transportation (Non-Medical):   Physical Activity:   . Days of Exercise per Week:    . Minutes of Exercise per Session:   Stress:   . Feeling of Stress :   Social Connections:   . Frequency of Communication with Friends and Family:   . Frequency of Social Gatherings with Friends and Family:   . Attends Religious Services:   . Active Member of Clubs or Organizations:   . Attends Archivist Meetings:   Marland Kitchen Marital Status:   Intimate Partner Violence:   . Fear of Current or Ex-Partner:   .  Emotionally Abused:   Marland Kitchen Physically Abused:   . Sexually Abused:       PHYSICAL EXAM  There were no vitals filed for this visit. There is no height or weight on file to calculate BMI.  Generalized: Well developed, in no acute distress  Cardiology: normal rate and rhythm, no murmur noted Neurological examination  Mentation: Alert oriented to time, place, history taking. Follows all commands speech and language fluent Cranial nerve II-XII: Pupils were equal round reactive to light. Extraocular movements were full, visual field were full on confrontational test. Facial sensation and strength were normal. Uvula tongue midline. Head turning and shoulder shrug  were normal and symmetric. Motor: The motor testing reveals 5 over 5 strength of upper extremities, 4/5 bilateral lowers. Good symmetric motor tone is noted throughout.  Sensory: Sensory testing is intact to soft touch on all 4 extremities. No evidence of extinction is noted.  Coordination: Cerebellar testing reveals mildly ataxic finger-nose-finger (L>R) and reduced heel-to-shin bilaterally.  Gait and station: Gait is ataxic, stable with Rolator, left leg drags Reflexes: Deep tendon reflexes are symmetric, brisk   DIAGNOSTIC DATA (LABS, IMAGING, TESTING) - I reviewed patient records, labs, notes, testing and imaging myself where available.  No flowsheet data found.   No results found for: WBC, HGB, HCT, MCV, PLT No results found for: NA, K, CL, CO2, GLUCOSE, BUN, CREATININE, CALCIUM, PROT, ALBUMIN, AST, ALT,  ALKPHOS, BILITOT, GFRNONAA, GFRAA No results found for: CHOL, HDL, LDLCALC, LDLDIRECT, TRIG, CHOLHDL No results found for: HGBA1C No results found for: VITAMINB12 No results found for: TSH     ASSESSMENT AND PLAN 39 y.o. year old female  has no past medical history on file. here with     ICD-10-CM   1. Secondary progressive multiple sclerosis ([place])  G35 CBC with Differential/Platelets    CMP    Vitamin D, 25-hydroxy    Stratify JCV Ab (w/ Index) w/ Rflx  2. High risk medication use  Z79.899 CBC with Differential/Platelets    CMP    Stratify JCV Ab (w/ Index) w/ Rflx  3. Ataxic gait  R26.0   4. Vitamin D deficiency  E55.9 Vitamin D, 25-hydroxy  5. Depression, unspecified depression type  F32.9   6. Delayed sleep phase syndrome  G47.21     She is doing well today. MS is stable. She will continue Tysabri infusions. Continue Ampyra for gait, Zoloft for mood, gabapentin for dysesthesias and baclofen for muscle spasms. We will update labs today. She was encouraged to reach out to her insurance regarding coverage for PT. I feel she would benefit from continued therapy. Safety and fall precautions reviewed. She was encouraged to stay physically active. She will follow up in 6 months, sooner if needed.    Orders Placed This Encounter  Procedures  . CBC with Differential/Platelets  . CMP  . Vitamin D, 25-hydroxy  . Stratify JCV Ab (w/ Index) w/ Rflx     No orders of the defined types were placed in this encounter.     I spent 30 minutes with the patient. 50% of this time was spent counseling and educating patient on plan of care and medications.    Marayah Dicristina, FNP-C 10/02/2019, 9:40 AM [facility] [address], [place], [place] [address] [phone]

## 2019-10-03 ENCOUNTER — Telehealth: Payer: Self-pay | Admitting: *Deleted

## 2019-10-03 LAB — CBC WITH DIFFERENTIAL/PLATELET
Basophils Absolute: 0.1 10*3/uL (ref 0.0–0.2)
Basos: 1 %
EOS (ABSOLUTE): 0.2 10*3/uL (ref 0.0–0.4)
Eos: 2 %
Hematocrit: 34.3 % (ref 34.0–46.6)
Hemoglobin: 11 g/dL — ABNORMAL LOW (ref 11.1–15.9)
Immature Grans (Abs): 0 10*3/uL (ref 0.0–0.1)
Immature Granulocytes: 0 %
Lymphocytes Absolute: 3.7 10*3/uL — ABNORMAL HIGH (ref 0.7–3.1)
Lymphs: 46 %
MCH: 28 pg (ref 26.6–33.0)
MCHC: 32.1 g/dL (ref 31.5–35.7)
MCV: 87 fL (ref 79–97)
Monocytes Absolute: 0.5 10*3/uL (ref 0.1–0.9)
Monocytes: 6 %
Neutrophils Absolute: 3.6 10*3/uL (ref 1.4–7.0)
Neutrophils: 45 %
Platelets: 241 10*3/uL (ref 150–450)
RBC: 3.93 x10E6/uL (ref 3.77–5.28)
RDW: 13.3 % (ref 11.7–15.4)
WBC: 8.1 10*3/uL (ref 3.4–10.8)

## 2019-10-03 LAB — COMPREHENSIVE METABOLIC PANEL
ALT: 14 IU/L (ref 0–32)
AST: 15 IU/L (ref 0–40)
Albumin/Globulin Ratio: 1.9 (ref 1.2–2.2)
Albumin: 4.3 g/dL (ref 3.8–4.8)
Alkaline Phosphatase: 52 IU/L (ref 39–117)
BUN/Creatinine Ratio: 21 (ref 9–23)
BUN: 15 mg/dL (ref 6–24)
Bilirubin Total: 0.3 mg/dL (ref 0.0–1.2)
CO2: 23 mmol/L (ref 20–29)
Calcium: 8.9 mg/dL (ref 8.7–10.2)
Chloride: 107 mmol/L — ABNORMAL HIGH (ref 96–106)
Creatinine, Ser: 0.73 mg/dL (ref 0.57–1.00)
GFR calc Af Amer: 119 mL/min/{1.73_m2} (ref >59)
GFR calc non Af Amer: 103 mL/min/{1.73_m2} (ref >59)
Globulin, Total: 2.3 g/dL (ref 1.5–4.5)
Glucose: 57 mg/dL — ABNORMAL LOW (ref 65–99)
Potassium: 4.1 mmol/L (ref 3.5–5.2)
Sodium: 141 mmol/L (ref 134–144)
Total Protein: 6.6 g/dL (ref 6.0–8.5)

## 2019-10-03 LAB — VITAMIN D 25 HYDROXY (VIT D DEFICIENCY, FRACTURES): Vit D, 25-Hydroxy: 53.4 ng/mL (ref 30.0–100.0)

## 2019-10-03 NOTE — Telephone Encounter (Signed)
Called pt but the phone is not in service.  Called to relay labs please see Michelles note.

## 2019-10-03 NOTE — Telephone Encounter (Signed)
-----   Message from Debbora Presto, NP sent at 10/03/2019  9:56 AM EDT ----- Labs look stable

## 2019-10-03 NOTE — Telephone Encounter (Signed)
I spoke to her sister on [place]. She is aware of the lab results.

## 2019-10-09 DIAGNOSIS — G35 Multiple sclerosis: Secondary | ICD-10-CM | POA: Diagnosis not present

## 2019-10-09 NOTE — Telephone Encounter (Signed)
Received JCV antibody results  POSITIVE Index 0.49.

## 2019-10-09 NOTE — Progress Notes (Signed)
10-02-2019 JCV antibody results  POSITIVE Index 0.49. sy

## 2019-10-11 ENCOUNTER — Telehealth: Payer: Self-pay | Admitting: Family Medicine

## 2019-10-11 NOTE — Telephone Encounter (Signed)
JCV positive 0.49

## 2019-10-11 NOTE — Telephone Encounter (Signed)
I called and spoke to her sister, caregiver of pt. I relayed the message to her per AL/NP and Dr. Felecia Shelling.  Last tysabri was 10-09-19. Will let Liane, RN know in intrafusion re: appt change from 4 to 6 wks.  The only sx that she is aware of is increased urinary frequency (wears diaper) but this is for just in case. Did not think UTI.  She is aware to call for any change in neurologic sx.

## 2019-10-11 NOTE — Telephone Encounter (Signed)
Hey Dr Felecia Shelling. JCV positive (0.49) on Tysabri every 4 weeks. SPMS. Would you want to change Tysabri to every 5 weeks or should we switch to Ocrevus?

## 2019-10-11 NOTE — Telephone Encounter (Signed)
Mary Jackson, can you let [name] know that we are going to switch her Tysabri infusions to every 6 weeks. Her JCV was positive and this can increase her risk of PML (a complication of Tysabri that can cause worsening neurologic symptoms). Have her monitor closely for any worsening weakness, new numbness or changes in vision, gait or bowel or bladder habits. We will have Dr Felecia Shelling see her again in 6 months.

## 2019-10-11 NOTE — Telephone Encounter (Signed)
Lets change to every 6 weeks

## 2019-10-12 NOTE — Telephone Encounter (Signed)
Scheduled change given to Liane, RN, placed on her desk in [facility].

## 2019-11-08 ENCOUNTER — Telehealth: Payer: Self-pay | Admitting: *Deleted

## 2019-11-08 NOTE — Telephone Encounter (Signed)
I called spoke to [facility] with [place] [phone] need new prescription but will also require PA.  Last ordered by neurologist Dr. Lovenia Shuck in [place]. Marland Kitchen

## 2019-11-09 MED ORDER — DALFAMPRIDINE ER 10 MG PO TB12: 10.0000 mg | ORAL_TABLET | Freq: Two times a day (BID) | ORAL | 11 refills | Status: DC

## 2019-11-09 NOTE — Telephone Encounter (Signed)
Order sent.

## 2019-11-09 NOTE — Telephone Encounter (Signed)
CMM Initiated for PA Dalfampridine 10mg  ER KEY #[id number].  Approvedtoday PA Case: [id number], Status: Approved, Coverage Starts on: 07/14/2019 12:00:00 AM, Coverage Ends on: 07/12/2020 12:00:00 AM. Questions? Contact [phone].

## 2019-11-09 NOTE — Telephone Encounter (Signed)
Fax confirmation received [employer] spec [phone].

## 2019-11-20 DIAGNOSIS — G35 Multiple sclerosis: Secondary | ICD-10-CM | POA: Diagnosis not present

## 2020-01-01 DIAGNOSIS — G35 Multiple sclerosis: Secondary | ICD-10-CM | POA: Diagnosis not present

## 2020-01-31 ENCOUNTER — Other Ambulatory Visit: Payer: Self-pay | Admitting: Neurology

## 2020-02-12 DIAGNOSIS — G35 Multiple sclerosis: Secondary | ICD-10-CM | POA: Diagnosis not present

## 2020-02-20 ENCOUNTER — Telehealth: Payer: Self-pay | Admitting: *Deleted

## 2020-02-20 NOTE — Telephone Encounter (Signed)
Received fax from touch prescribing program that pt re-authorized for Tysabri from 02/19/20-09/20/20. Pt enrollment number: [id number]. Account: [employer]. Site auth number: [id number].

## 2020-03-25 ENCOUNTER — Other Ambulatory Visit: Payer: Self-pay | Admitting: Neurology

## 2020-03-25 DIAGNOSIS — Z79899 Other long term (current) drug therapy: Secondary | ICD-10-CM | POA: Diagnosis not present

## 2020-03-25 DIAGNOSIS — G35 Multiple sclerosis: Secondary | ICD-10-CM

## 2020-03-25 NOTE — Progress Notes (Signed)
Called and spoke with pt sister, Rollene Fare (on [place]). Explained JCV ab lab collected today was not a good sample. Advised it would need to be redrawn. Apologized for any inconvenience. She will bring her tomorrow morning to have this redrawn. I placed her on the lab schedule.  Spoke w/ Laqueta Linden and she is aware pt will come back in the morning. Order placed today will be good for her to use tomorrow.

## 2020-03-25 NOTE — Addendum Note (Signed)
Addended by: Inis Sizer D on: 03/25/2020 02:46 PM   Modules accepted: Orders

## 2020-03-26 ENCOUNTER — Other Ambulatory Visit (INDEPENDENT_AMBULATORY_CARE_PROVIDER_SITE_OTHER): Payer: Self-pay

## 2020-03-26 DIAGNOSIS — Z0289 Encounter for other administrative examinations: Secondary | ICD-10-CM

## 2020-03-26 LAB — CBC WITH DIFFERENTIAL/PLATELET
Basophils Absolute: 0.1 10*3/uL (ref 0.0–0.2)
Basos: 1 %
EOS (ABSOLUTE): 0.4 10*3/uL (ref 0.0–0.4)
Eos: 5 %
Hematocrit: 34.9 % (ref 34.0–46.6)
Hemoglobin: 11.1 g/dL (ref 11.1–15.9)
Immature Grans (Abs): 0 10*3/uL (ref 0.0–0.1)
Immature Granulocytes: 0 %
Lymphocytes Absolute: 4.3 10*3/uL — ABNORMAL HIGH (ref 0.7–3.1)
Lymphs: 50 %
MCH: 27.1 pg (ref 26.6–33.0)
MCHC: 31.8 g/dL (ref 31.5–35.7)
MCV: 85 fL (ref 79–97)
Monocytes Absolute: 0.5 10*3/uL (ref 0.1–0.9)
Monocytes: 6 %
Neutrophils Absolute: 3.2 10*3/uL (ref 1.4–7.0)
Neutrophils: 38 %
Platelets: 277 10*3/uL (ref 150–450)
RBC: 4.1 x10E6/uL (ref 3.77–5.28)
RDW: 13.4 % (ref 11.7–15.4)
WBC: 8.4 10*3/uL (ref 3.4–10.8)

## 2020-03-26 NOTE — Progress Notes (Signed)
Placed JCV lab in [facility] lock box for routine lab pick up. Results pending. 

## 2020-04-02 NOTE — Progress Notes (Signed)
JCV ab drawn on 03/26/20 indeterminate, index: 0.29. Inhibition assay: negative

## 2020-04-03 ENCOUNTER — Ambulatory Visit (INDEPENDENT_AMBULATORY_CARE_PROVIDER_SITE_OTHER): Payer: Medicare HMO | Admitting: Neurology

## 2020-04-03 ENCOUNTER — Encounter: Payer: Self-pay | Admitting: Neurology

## 2020-04-03 ENCOUNTER — Telehealth: Payer: Self-pay | Admitting: Neurology

## 2020-04-03 VITALS — BP 98/58 | HR 70 | Ht 67.0 in | Wt 174.5 lb

## 2020-04-03 DIAGNOSIS — G35 Multiple sclerosis: Secondary | ICD-10-CM | POA: Diagnosis not present

## 2020-04-03 DIAGNOSIS — R260 Ataxic gait: Secondary | ICD-10-CM | POA: Diagnosis not present

## 2020-04-03 DIAGNOSIS — Z79899 Other long term (current) drug therapy: Secondary | ICD-10-CM

## 2020-04-03 DIAGNOSIS — E559 Vitamin D deficiency, unspecified: Secondary | ICD-10-CM | POA: Diagnosis not present

## 2020-04-03 DIAGNOSIS — G4721 Circadian rhythm sleep disorder, delayed sleep phase type: Secondary | ICD-10-CM

## 2020-04-03 DIAGNOSIS — F329 Major depressive disorder, single episode, unspecified: Secondary | ICD-10-CM

## 2020-04-03 DIAGNOSIS — F32A Depression, unspecified: Secondary | ICD-10-CM

## 2020-04-03 MED ORDER — SERTRALINE HCL 100 MG PO TABS
100.0000 mg | ORAL_TABLET | Freq: Every day | ORAL | 3 refills | Status: DC
Start: 2020-04-03 — End: 2021-01-29

## 2020-04-03 MED ORDER — TRAZODONE HCL 100 MG PO TABS: ORAL_TABLET | ORAL | 3 refills | Status: DC

## 2020-04-03 MED ORDER — BACLOFEN 10 MG PO TABS
10.0000 mg | ORAL_TABLET | Freq: Three times a day (TID) | ORAL | 3 refills | Status: DC
Start: 2020-04-03 — End: 2021-01-29

## 2020-04-03 MED ORDER — GABAPENTIN 300 MG PO CAPS
300.0000 mg | ORAL_CAPSULE | Freq: Three times a day (TID) | ORAL | 3 refills | Status: DC
Start: 2020-04-03 — End: 2021-07-23

## 2020-04-03 NOTE — Telephone Encounter (Signed)
Mcarthur Rossetti Josem Kaufmann: [phone] (exp. 04/03/20 to 05/03/20) order sent to GI . They will reach out to the patient to schedule.

## 2020-04-03 NOTE — Progress Notes (Signed)
[facility]  PATIENT: Mary Jackson DOB: 1980/07/26  REFERRING DOCTOR OR PCP:  Dr. Lovenia Shuck SOURCE: Patient, extensive notes from neurology, imaging and laboratory reports, MRI images personally reviewed.  _________________________________   HISTORICAL  CHIEF COMPLAINT:  Chief Complaint  Patient presents with  . Follow-up    RM 12, alone. Last seen 10/02/2019 by AL,NP.   . Multiple Sclerosis    On Tysabri. Last: 03/25/20, next: 05/06/20. Last JCV 03/25/20 indeterminate, index: 0.29. Inhibition assay: negative  . Gait Problem    Ambulates with rolling walker     HISTORY OF PRESENT ILLNESS:  Mary Jackson is a 39 y.o. woman with relapsing/active form of secondary progressive multiple sclerosis.  Update 04/03/2020: She is on Tysabri and has no recent exacerbation.    Her last infusion was 03/26/2019.  JCV antibody 03/25/2020 was 0.29, negative inhibition assay.  She uses a walker due to right > left  leg weakness.    She takes Ampyra bid and feels it helps her gait some.   She has ataxia in her legs.   Baclofen has helped her spasms and gabapentin tid has helped her dysesthesias.     Bladder function is fine.   She has reduced vision OD and colors are desaturated OD.    Sometimes she has diplopia.      She is often fatigued.  She has trouble falling asleep and seldom goes to sleep before 1 am .  She does not snore.    She has had some depression, helped by sertraline.    She has seen psychology/counseling in the past for depression, stress management and other issues.     MS History:   She was diagnosed with MS in 2008 after presenting with left leg weakness and clumsiness and a fall.  Imaging studies were performed and she was diagnosed.   She was initially placed on Copaxone but she had multiple relapses.   She was placed on Tysabri and is now on every 6 weeks.   I personally reviewed MRIs of the brain dated 4/2 2019 and 04/09/2016.  The MRI showed multiple  T2/flair hyperintense foci with single and confluent lesions in the periventricular, juxtacortical and deep white matter and also is a few foci in the infratentorial white matter and apparently at the cervical medullary junction.  We do not have an MRI of the cervical spine.  MRI report from 2015 did mention that there were new lesions compared to the previous MRI.  She has no FH of MS.   Parents have HTN.     REVIEW OF SYSTEMS: Constitutional: No fevers, chills, sweats, or change in appetite Eyes: No visual changes, double vision, eye pain Ear, nose and throat: No hearing loss, ear pain, nasal congestion, sore throat Cardiovascular: No chest pain, palpitations Respiratory: No shortness of breath at rest or with exertion.   No wheezes GastrointestinaI: No nausea, vomiting, diarrhea, abdominal pain, fecal incontinence Genitourinary: No dysuria, urinary retention or frequency.  No nocturia. Musculoskeletal: No neck pain, back pain Integumentary: No rash, pruritus, skin lesions Neurological: as above Psychiatric: No depression at this time.  No anxiety Endocrine: No palpitations, diaphoresis, change in appetite, change in weigh or increased thirst Hematologic/Lymphatic: No anemia, purpura, petechiae. Allergic/Immunologic: No itchy/runny eyes, nasal congestion, recent allergic reactions, rashes  ALLERGIES: No Known Allergies  HOME MEDICATIONS:  Current Outpatient Medications:  .  baclofen (LIORESAL) 10 MG tablet, Take 1 tablet (10 mg total) by mouth 3 (three) times daily., Disp: 270 each, Rfl:  3 .  dalfampridine 10 MG TB12, Take 1 tablet (10 mg total) by mouth 2 (two) times daily., Disp: 60 tablet, Rfl: 11 .  gabapentin (NEURONTIN) 300 MG capsule, Take 1 capsule (300 mg total) by mouth 3 (three) times daily., Disp: 270 capsule, Rfl: 3 .  IBU 800 MG tablet, TAKE 1 TABLET TWICE DAILY, Disp: 180 tablet, Rfl: 1 .  natalizumab (TYSABRI) 300 MG/15ML injection, Inject into the vein., Disp: ,  Rfl:  .  sertraline (ZOLOFT) 100 MG tablet, Take 1 tablet (100 mg total) by mouth daily., Disp: 90 tablet, Rfl: 3 .  Ascorbic Acid (VITAMIN C PO), Take 1,000 mg by mouth daily. (Patient not taking: Reported on 04/03/2020), Disp: , Rfl:  .  Cholecalciferol (VITAMIN D3 PO), Take 5,000 Units by mouth daily. (Patient not taking: Reported on 04/03/2020), Disp: , Rfl:  .  Cyanocobalamin (VITAMIN B-12 PO), Take 1,000 mcg by mouth daily. (Patient not taking: Reported on 04/03/2020), Disp: , Rfl:  .  ferrous sulfate 324 MG TBEC, Take 324 mg by mouth., Disp: , Rfl:  .  traZODone (DESYREL) 100 MG tablet, 1/2 to 1 po qHS, Disp: 90 tablet, Rfl: 3 .  VITAMIN E PO, Take 400 Units by mouth daily. (Patient not taking: Reported on 04/03/2020), Disp: , Rfl:   PAST MEDICAL HISTORY: No past medical history on file.  PAST SURGICAL HISTORY:   FAMILY HISTORY: No family history on file.  SOCIAL HISTORY:  Social History   Socioeconomic History  . Marital status: Single    Spouse name: Not on file  . Number of children: Not on file  . Years of education: Not on file  . Highest education level: Not on file  Occupational History  . Not on file  Tobacco Use  . Smoking status: Current Every Day Smoker    Types: Cigarettes  . Smokeless tobacco: Never Used  . Tobacco comment: 3-5 cigs per day  Substance and Sexual Activity  . Alcohol use: Yes    Comment: sometimes   . Drug use: Yes    Types: Marijuana  . Sexual activity: Not on file  Other Topics Concern  . Not on file  Social History Narrative   Right handed   Caffeine use: coffee, tea, soda   Lives with sister, Rollene Fare   Social Determinants of Health   Financial Resource Strain:   . Difficulty of Paying Living Expenses: Not on file  Food Insecurity:   . Worried About Charity fundraiser in the Last Year: Not on file  . Ran Out of Food in the Last Year: Not on file  Transportation Needs:   . Lack of Transportation (Medical): Not on file  . Lack  of Transportation (Non-Medical): Not on file  Physical Activity:   . Days of Exercise per Week: Not on file  . Minutes of Exercise per Session: Not on file  Stress:   . Feeling of Stress : Not on file  Social Connections:   . Frequency of Communication with Friends and Family: Not on file  . Frequency of Social Gatherings with Friends and Family: Not on file  . Attends Religious Services: Not on file  . Active Member of Clubs or Organizations: Not on file  . Attends Archivist Meetings: Not on file  . Marital Status: Not on file  Intimate Partner Violence:   . Fear of Current or Ex-Partner: Not on file  . Emotionally Abused: Not on file  . Physically Abused: Not on file  .  Sexually Abused: Not on file     PHYSICAL EXAM  Vitals:   04/03/20 1009  BP: (!) 98/58  Pulse: 70  Weight: 174 lb 8 oz (79.2 kg)  Height: 5\' 7"  (1.702 m)    Body mass index is 27.33 kg/m.   General: The patient is well-developed and well-nourished and in no acute distress  HEENT:  Head is [place]/AT.  Sclera are anicteric.  Funduscopic exam shows normal optic discs and retinal vessels.  Neck: No carotid bruits are noted.  The neck is nontender.  Cardiovascular: The heart has a regular rate and rhythm with a normal S1 and S2. There were no murmurs, gallops or rubs.    Skin: Extremities are without rash or  edema.  Musculoskeletal:  Back is nontender  Neurologic Exam  Mental status: The patient is alert and oriented x 3 at the time of the examination. The patient has apparent normal recent and remote memory, with an apparently normal attention span and concentration ability.   Speech is normal.  Cranial nerves: Extraocular movements show nystagmus on lateral gaze with slight disconjugate gaze looking right..  She has a 1+ a current pupillary defect and colors are desaturated OD facial symmetry is present. Facial strength is normal.  Trapezius and sternocleidomastoid strength is normal. No  dysarthria is noted.    No obvious hearing deficits are noted.  Motor:  Muscle bulk is normal.   Tone is normal.  Strength is 5/5 in the arms, 4/5 proximally in the legs and in the feet and ankles 4+/5 the gastrocnemius muscles.     Sensory: Sensory testing is intact to pinprick, soft touch and vibration sensation in all 4 extremities.  Coordination: Cerebellar testing reveals mildly reduced left finger-nose-finger and mildly reduced right and moderately reduced left heel-to-shin .  Gait and station: Station is normal but she needs to use her hands to get out of a chair.  The gait is ataxic and wide.  Stride is very reduced without a walker.  With a walker she can do better.  She is unable to tandem walk.  Romberg is positive.  Reflexes: Deep tendon reflexes are mildly increased in the arms, left greater than right.  Reflexes are increased with crossed abductor responses at the knees, left more than right.  2 beats of nonsustained ankle clonus on the left..       ASSESSMENT AND PLAN  Secondary progressive multiple sclerosis ([place]) - Plan: Ambulatory referral to Internal Medicine, MR BRAIN WO CONTRAST, MR CERVICAL SPINE WO CONTRAST  Vitamin D deficiency  Ataxic gait  Delayed sleep phase syndrome  Depression, unspecified depression type  High risk medication use   1.  Continue Tysabri every 6 weeks 2.   Continue gabapentin, baclofen, sertraline 3.   Trazodone for sleep   Take with melatonin 4.   RTC 6 months, sooner if new or worsening issues.    Jentzen Minasyan A. Felecia Shelling, MD, [facility] 8/84/1660, 63:01 AM Certified in Neurology, Clinical Neurophysiology, Sleep Medicine and Neuroimaging  [facility] [address], [place], [place] [address] [phone]

## 2020-04-08 ENCOUNTER — Ambulatory Visit: Payer: Medicare HMO | Admitting: Neurology

## 2020-04-29 ENCOUNTER — Ambulatory Visit
Admission: RE | Admit: 2020-04-29 | Discharge: 2020-04-29 | Disposition: A | Discharge status: Home / Self Care | Payer: Medicare HMO | Source: Ambulatory Visit | Attending: Neurology | Admitting: Neurology

## 2020-04-29 ENCOUNTER — Other Ambulatory Visit: Payer: Self-pay

## 2020-04-29 DIAGNOSIS — G35 Multiple sclerosis: Secondary | ICD-10-CM

## 2020-04-30 ENCOUNTER — Telehealth: Payer: Self-pay | Admitting: *Deleted

## 2020-04-30 NOTE — Telephone Encounter (Signed)
-----   Message from Britt Bottom, MD sent at 04/29/2020  6:37 PM EDT ----- Please let her know I took a look at the MRI of the brain and compared it to the last MRI from 2019.  It looks okay- it does not show any new MS lesions.  The MRI of the cervical spine showed MS foci in the cervical spinal cord but none of them look new. .  She also has some disc bulges but no nerve root compression.

## 2020-04-30 NOTE — Telephone Encounter (Signed)
Called and spoke with pt sister, Rollene Fare (on [place]). Pt still sleeping. Relayed results per Dr. Felecia Shelling note. She verbalized understanding. She also requested form for DMV parking placard. Advised we will get this ready and she can pick up from our office. She verbalized understanding.

## 2020-05-06 DIAGNOSIS — G35 Multiple sclerosis: Secondary | ICD-10-CM | POA: Diagnosis not present

## 2020-05-23 ENCOUNTER — Telehealth: Payer: Self-pay | Admitting: Neurology

## 2020-05-23 NOTE — Telephone Encounter (Signed)
MEG from [place] called for prescription request for natalizumab (TYSABRI) 300 MG/15ML injection.  Best contact number: [phone]

## 2020-05-23 NOTE — Telephone Encounter (Signed)
We have received this request twice by fax. Per infusion suite, they need to speak with MD about using specialty pharmacy first prior to sending script. Pt next infusion in December. Dr. Felecia Shelling does not return to the office until next week.

## 2020-06-03 ENCOUNTER — Other Ambulatory Visit: Payer: Self-pay | Admitting: *Deleted

## 2020-06-03 MED ORDER — IBUPROFEN 800 MG PO TABS
800.0000 mg | ORAL_TABLET | Freq: Two times a day (BID) | ORAL | 1 refills | Status: DC
Start: 2020-06-03 — End: 2020-10-22

## 2020-06-18 DIAGNOSIS — G35 Multiple sclerosis: Secondary | ICD-10-CM | POA: Diagnosis not present

## 2020-07-30 DIAGNOSIS — G35 Multiple sclerosis: Secondary | ICD-10-CM | POA: Diagnosis not present

## 2020-07-31 ENCOUNTER — Telehealth: Payer: Self-pay | Admitting: *Deleted

## 2020-07-31 NOTE — Telephone Encounter (Signed)
Gave completed/signed SCAT application back to medical records to process for pt.

## 2020-08-19 ENCOUNTER — Telehealth: Payer: Self-pay | Admitting: *Deleted

## 2020-08-19 NOTE — Telephone Encounter (Signed)
Faxed completed/signed Tysabri pt status report and reauth questionnaire to [facility] at [phone]. Received confirmation.  Received fax from touch prescribing program that pt re-authorized for Tysabri from 08/19/20-03/22/21.  Pt enrollment number: [id number]. Account: [facility]. Site auth number: [id number].

## 2020-08-27 DIAGNOSIS — G35 Multiple sclerosis: Secondary | ICD-10-CM | POA: Diagnosis not present

## 2020-10-01 ENCOUNTER — Encounter: Payer: Self-pay | Admitting: Family Medicine

## 2020-10-01 ENCOUNTER — Ambulatory Visit (INDEPENDENT_AMBULATORY_CARE_PROVIDER_SITE_OTHER): Payer: Medicare HMO | Admitting: Family Medicine

## 2020-10-01 ENCOUNTER — Telehealth: Payer: Self-pay | Admitting: *Deleted

## 2020-10-01 VITALS — BP 93/62 | HR 64 | Ht 67.0 in | Wt 179.0 lb

## 2020-10-01 DIAGNOSIS — R260 Ataxic gait: Secondary | ICD-10-CM

## 2020-10-01 DIAGNOSIS — F32A Depression, unspecified: Secondary | ICD-10-CM | POA: Diagnosis not present

## 2020-10-01 DIAGNOSIS — G4721 Circadian rhythm sleep disorder, delayed sleep phase type: Secondary | ICD-10-CM | POA: Diagnosis not present

## 2020-10-01 DIAGNOSIS — E559 Vitamin D deficiency, unspecified: Secondary | ICD-10-CM | POA: Diagnosis not present

## 2020-10-01 DIAGNOSIS — G35 Multiple sclerosis: Secondary | ICD-10-CM | POA: Diagnosis not present

## 2020-10-01 DIAGNOSIS — Z79899 Other long term (current) drug therapy: Secondary | ICD-10-CM

## 2020-10-01 NOTE — Progress Notes (Signed)
PATIENT: Mary Jackson DOB: 24-Sep-1980  REASON FOR VISIT: follow up HISTORY FROM: patient  Chief Complaint  Patient presents with  . Follow-up    RM 1 alone Pt is well, things are about the same      HISTORY OF PRESENT ILLNESS:  10/01/20 ALL:  She returns for follow up for relapsing/active secondary progressive MS. She continues Tysabri infusions q6w. JCV result 03/25/2020 was 0.29, assay negative. MRI brain and cervical spine were stable 04/2020.   She feels that MS is stable. No changes since last being seen. Gait is stable with Rolator. No falls. She has bilateral lower weakness. She would like to work with PT as this has helped with strengthening in the past. No worsening pain. Dysesthesias was and wane. She continues Ampyra BID, gabapentin 300mg  TID and baclofen TID.   She is sleeping well. Trazodone makes her too sleepy. She has tried taking 1/2 tablet. Mood is good. She continues sertraline and tolerating well.   She is taking vitamin D 5,000iu daily.    04/03/2020 RS: Mary Jackson is a 40 y.o. woman with relapsing/active form of secondary progressive multiple sclerosis.  She is on Tysabri and has no recent exacerbation.    Her last infusion was 03/26/2019.  JCV antibody 03/25/2020 was 0.29, negative inhibition assay.  She uses a walker due to right > left  leg weakness.    She takes Ampyra bid and feels it helps her gait some.   She has ataxia in her legs.   Baclofen has helped her spasms and gabapentin tid has helped her dysesthesias.     Bladder function is fine.   She has reduced vision OD and colors are desaturated OD.    Sometimes she has diplopia.      She is often fatigued.  She has trouble falling asleep and seldom goes to sleep before 1 am .  She does not snore.    She has had some depression, helped by sertraline.    She has seen psychology/counseling in the past for depression, stress management and other issues.     MS History:   She was diagnosed with MS  in 2008 after presenting with left leg weakness and clumsiness and a fall.  Imaging studies were performed and she was diagnosed.   She was initially placed on Copaxone but she had multiple relapses.   She was placed on Tysabri and is now on every 6 weeks.   I personally reviewed MRIs of the brain dated 4/2 2019 and 04/09/2016.  The MRI showed multiple T2/flair hyperintense foci with single and confluent lesions in the periventricular, juxtacortical and deep white matter and also is a few foci in the infratentorial white matter and apparently at the cervical medullary junction.  We do not have an MRI of the cervical spine.  MRI report from 2015 did mention that there were new lesions compared to the previous MRI.  She has no FH of MS.   Parents have HTN.   10/02/2019 ALL:  Mary Jackson is a 40 y.o. female here today for follow up for secondary progressive MS. She continues Tysabri infusions. She feels that, overall, MS symptoms are stable. Last infusion was first of March. She feels that gait is stable. No falls. She uses her Rolator all the time. She continues Ampyra twice daily. She had one PT session. She has not returned due to financing. No recent falls. She feels that gabapentin 300mg  TID helps with dysesthesias. She continues baclofen as  needed. She usually takes two tablets at a time when muscle spasms in calves are bad. She denies adverse effects. She feels that mood is stable. She is now living in her home with her great niece. She continues sertraline 100mg  daily. She does feel that she needs to see behavioral health at this time. She feels that she is sleeping better. She tried melatonin for a little bit but feels that she did not need it any longer. She is taking vitamin D supplements. She was started on 5,000iu daily but reports that her sister bought the wrong dose last month and she has been taking 2000iu for the past month.  Last MRI 10/2017.    REVIEW OF SYSTEMS: Out of a complete 14  system review of symptoms, the patient complains only of the following symptoms, chronic pain, numbness and tingling, imbalance and all other reviewed systems are negative.  ALLERGIES: No Known Allergies  HOME MEDICATIONS: Outpatient Medications Prior to Visit  Medication Sig Dispense Refill  . Ascorbic Acid (VITAMIN C PO) Take 1,000 mg by mouth daily.    . baclofen (LIORESAL) 10 MG tablet Take 1 tablet (10 mg total) by mouth 3 (three) times daily. 270 each 3  . Cholecalciferol (VITAMIN D3 PO) Take 5,000 Units by mouth daily.    . Cyanocobalamin (VITAMIN B-12 PO) Take 1,000 mcg by mouth daily.    Marland Kitchen dalfampridine 10 MG TB12 Take 1 tablet (10 mg total) by mouth 2 (two) times daily. 60 tablet 11  . ferrous sulfate 324 MG TBEC Take 324 mg by mouth.    . gabapentin (NEURONTIN) 300 MG capsule Take 1 capsule (300 mg total) by mouth 3 (three) times daily. 270 capsule 3  . ibuprofen (IBU) 800 MG tablet Take 1 tablet (800 mg total) by mouth 2 (two) times daily. 180 tablet 1  . natalizumab (TYSABRI) 300 MG/15ML injection Inject into the vein.    Marland Kitchen sertraline (ZOLOFT) 100 MG tablet Take 1 tablet (100 mg total) by mouth daily. 90 tablet 3  . VITAMIN E PO Take 400 Units by mouth daily.    . traZODone (DESYREL) 100 MG tablet 1/2 to 1 po qHS 90 tablet 3   No facility-administered medications prior to visit.    PAST MEDICAL HISTORY: History reviewed. No pertinent past medical history.  PAST SURGICAL HISTORY: History reviewed. No pertinent surgical history.  FAMILY HISTORY: History reviewed. No pertinent family history.  SOCIAL HISTORY: Social History   Socioeconomic History  . Marital status: Single    Spouse name: Not on file  . Number of children: Not on file  . Years of education: Not on file  . Highest education level: Not on file  Occupational History  . Not on file  Tobacco Use  . Smoking status: Current Every Day Smoker    Types: Cigarettes  . Smokeless tobacco: Never Used  .  Tobacco comment: 3-5 cigs per day  Substance and Sexual Activity  . Alcohol use: Yes    Comment: sometimes   . Drug use: Yes    Types: Marijuana  . Sexual activity: Not on file  Other Topics Concern  . Not on file  Social History Narrative   Right handed   Caffeine use: coffee, tea, soda   Lives with sister, Rollene Fare   Social Determinants of Health   Financial Resource Strain: Not on file  Food Insecurity: Not on file  Transportation Needs: Not on file  Physical Activity: Not on file  Stress: Not on file  Social Connections: Not on file  Intimate Partner Violence: Not on file      PHYSICAL EXAM  Vitals:   10/01/20 1023  BP: 93/62  Pulse: 64  Weight: 179 lb (81.2 kg)  Height: 5\' 7"  (1.702 m)   Body mass index is 28.04 kg/m.  Generalized: Well developed, in no acute distress  Cardiology: normal rate and rhythm, no murmur noted Neurological examination  Mentation: Alert oriented to time, place, history taking. Follows all commands speech and language fluent Cranial nerve II-XII: Pupils were equal round reactive to light. Extraocular movements were full, visual field were full on confrontational test. Facial sensation and strength were normal. Head turning and shoulder shrug  were normal and symmetric. Motor: The motor testing reveals 5 over 5 strength of upper extremities, 4/5 bilateral lowers. Good symmetric motor tone is noted throughout.  Sensory: Sensory testing is intact to soft touch on all 4 extremities. No evidence of extinction is noted.  Coordination: Cerebellar testing reveals mildly ataxic finger-nose-finger (L>R) and reduced heel-to-shin bilaterally.  Gait and station: Gait is ataxic, stable with Rolator, left leg drags Reflexes: Deep tendon reflexes are symmetric, brisk   DIAGNOSTIC DATA (LABS, IMAGING, TESTING) - I reviewed patient records, labs, notes, testing and imaging myself where available.  No flowsheet data found.   Lab Results  Component  Value Date   WBC 8.4 03/25/2020   HGB 11.1 03/25/2020   HCT 34.9 03/25/2020   MCV 85 03/25/2020   PLT 277 03/25/2020      Component Value Date/Time   NA 141 10/02/2019 1008   K 4.1 10/02/2019 1008   CL 107 (H) 10/02/2019 1008   CO2 23 10/02/2019 1008   GLUCOSE 57 (L) 10/02/2019 1008   BUN 15 10/02/2019 1008   CREATININE 0.73 10/02/2019 1008   CALCIUM 8.9 10/02/2019 1008   PROT 6.6 10/02/2019 1008   ALBUMIN 4.3 10/02/2019 1008   AST 15 10/02/2019 1008   ALT 14 10/02/2019 1008   ALKPHOS 52 10/02/2019 1008   BILITOT 0.3 10/02/2019 1008   GFRNONAA 103 10/02/2019 1008   GFRAA 119 10/02/2019 1008   No results found for: CHOL, HDL, LDLCALC, LDLDIRECT, TRIG, CHOLHDL No results found for: HGBA1C No results found for: VITAMINB12 No results found for: TSH     ASSESSMENT AND PLAN 40 y.o. year old female  has no past medical history on file. here with     ICD-10-CM   1. Secondary progressive multiple sclerosis ([place])  G35 CBC with Differential/Platelets    Stratify JCV Ab (w/ Index) w/ Rflx    Ambulatory referral to Physical Therapy  2. High risk medication use  Z79.899 CBC with Differential/Platelets    Stratify JCV Ab (w/ Index) w/ Rflx  3. Ataxic gait  R26.0   4. Delayed sleep phase syndrome  G47.21   5. Depression, unspecified depression type  F32.A   6. Vitamin D deficiency  E55.9 Vitamin D, 25-hydroxy     She is doing well today. MS is stable. She will continue Tysabri infusions every 6 weeks. Continue Ampyra for gait, Zoloft for mood, gabapentin for dysesthesias and baclofen for muscle spasms. We will update labs today. I will send referral for PT.  I feel she would benefit from continued therapy. Safety and fall precautions reviewed. She was encouraged to stay physically active. She will follow up in 6 months, sooner if needed.    Orders Placed This Encounter  Procedures  . CBC with Differential/Platelets  . Vitamin D, 25-hydroxy  .  Stratify JCV Ab (w/ Index) w/ Rflx   . Ambulatory referral to Physical Therapy    Referral Priority:   Routine    Referral Type:   Physical Medicine    Referral Reason:   Specialty Services Required    Requested Specialty:   Physical Therapy    Number of Visits Requested:   1     No orders of the defined types were placed in this encounter.     I spent 30 minutes with the patient. 50% of this time was spent counseling and educating patient on plan of care and medications.    Royetta Probus, FNP-C 10/01/2020, 11:05 AM [facility] [address], [place], [place] [address] [phone]

## 2020-10-01 NOTE — Progress Notes (Signed)
I have read the note, and I agree with the clinical assessment and plan.  Scotlyn Mccranie A. Jalilah Wiltsie, MD, PhD, FAAN Certified in Neurology, Clinical Neurophysiology, Sleep Medicine, Pain Medicine and Neuroimaging  [facility] [address], [address] [place], [place] [address] [phone]  

## 2020-10-01 NOTE — Patient Instructions (Signed)
Below is our plan:  We will continue current treatment plan. We will update labs.   Please make sure you are staying well hydrated. I recommend 50-60 ounces daily. Well balanced diet and regular exercise encouraged. Consistent sleep schedule with 6-8 hours recommended.   Please continue follow up with care team as directed.   Follow up with Dr Felecia Shelling.   You may receive a survey regarding today's visit. I encourage you to leave honest feed back as I do use this information to improve patient care. Thank you for seeing me today!     Multiple Sclerosis Multiple sclerosis (MS) is a disease of the brain, spinal cord, and optic nerves (central nervous system). It causes the body's disease-fighting (immune) system to destroy the protective covering (myelin sheath) around nerves in the brain. When this happens, signals (nerve impulses) going to and from the brain and spinal cord do not get sent properly or may not get sent at all. There are several types of MS:  Relapsing-remitting MS. This is the most common type. This causes sudden attacks of symptoms. After an attack, you may recover completely until the next attack, or some symptoms may remain permanently.  Secondary progressive MS. This usually develops after the onset of relapsing-remitting MS. Similar to relapsing-remitting MS, this type also causes sudden attacks of symptoms. Attacks may be less frequent, but symptoms slowly get worse (progress) over time.  Primary progressive MS. This causes symptoms that steadily progress over time. This type of MS does not cause sudden attacks of symptoms. The age of onset of MS varies, but it often develops between 32-3 years of age. MS is a lifelong (chronic) condition. There is no cure, but treatment can help slow down the progression of the disease. What are the causes? The cause of this condition is not known. What increases the risk? You are more likely to develop this condition if:  You are a  woman.  You have a relative with MS. However, the condition is not passed from parent to child (inherited).  You have a lack (deficiency) of vitamin D.  You smoke. MS is more common in the [place] than in the [place]. What are the signs or symptoms? Relapsing-remitting and secondary progressive MS cause symptoms to occur in episodes or attacks that may last weeks to months. There may be long periods between attacks in which there are almost no symptoms. Primary progressive MS causes symptoms to steadily progress after they develop. Symptoms of MS vary because of the many different ways it affects the central nervous system. The main symptoms include:  Vision problems and eye pain.  Numbness and weakness.  Inability to move your arms, hands, feet, or legs (paralysis).  Balance problems.  Shaking that you cannot control (tremors).  Muscle spasms.  Problems with thinking (cognitive changes). MS can also cause symptoms that are associated with the disease, but are not always the direct result of an MS attack. They may include:  Inability to control urination or bowel movements (incontinence).  Headaches.  Fatigue.  Inability to tolerate heat.  Emotional changes.  Depression.  Pain. How is this diagnosed? This condition is diagnosed based on:  Your symptoms.  A neurological exam. This involves checking central nervous system function, such as nerve function, reflexes, and coordination.  MRIs of the brain and spinal cord.  Lab tests, including a lumbar puncture that tests the fluid that surrounds the brain and spinal cord (cerebrospinal fluid).  Tests to measure  the electrical activity of the brain in response to stimulation (evoked potentials). How is this treated? There is no cure for MS, but medicines can help decrease the number and frequency of attacks and help relieve nuisance symptoms. Treatment options may include:  Medicines that  reduce the frequency of attacks. These medicines may be given by injection, by mouth (orally), or through an IV.  Medicines that reduce inflammation (steroids). These may provide short-term relief of symptoms.  Medicines to help control pain, depression, fatigue, or incontinence.  Nutritional counseling. Vitamin D supplements, if you have a deficiency.  Using devices to help you move around (assistive devices), such as braces, a cane, or a walker.  Physical therapy to strengthen and stretch your muscles.  Occupational therapy to help you with everyday tasks.  Alternative or complementary treatments such as exercise, massage, or acupuncture.   Follow these instructions at home:  Take over-the-counter and prescription medicines only as told by your health care provider.  Do not drive or use heavy machinery while taking prescription pain medicine.  Use assistive devices as recommended by your physical therapist or your health care provider.  Exercise as directed by your health care provider.  Eating healthy can help manage MS symptoms.  Return to your normal activities as told by your health care provider. Ask your health care provider what activities are safe for you.  Reach out for support. Share your feelings with friends, family, or a support group.  Keep all follow-up visits as told by your health care provider and therapists. This is important. Where to find more information  National Multiple Sclerosis Society: https://www.nationalmssociety.org  National Institute of Neurological Disorders and Stroke: https://www.ninds.nih.gov  National Center for Complementary and Integrative Health: https://www.nccih.nih.gov/ Contact a health care provider if:  You feel depressed.  You develop new pain or numbness.  You have tremors.  You have problems with sexual function. Get help right away if:  You develop paralysis.  You develop numbness.  You have problems with your  bladder or bowel function.  You develop double vision.  You lose vision in one or both eyes.  You develop suicidal thoughts.  You develop severe confusion. If you ever feel like you may hurt yourself or others, or have thoughts about taking your own life, get help right away. You can go to your nearest emergency department or call:  Your local emergency services (911 in the [place]  A suicide crisis helpline, such as the National Suicide Prevention Lifeline at 1-800-273-8255. This is open 24 hours a day. Summary  Multiple sclerosis (MS) is a disease of the central nervous system that causes the body's immune system to destroy the protective covering (myelin sheath) around nerves in the brain.  There are 3 types of MS: relapsing-remitting, secondary progressive, and primary progressive. Relapsing-remitting and secondary progressive MS cause symptoms to occur in episodes or attacks that may last weeks to months. Primary progressive MS causes symptoms to steadily progress after they develop.  There is no cure for MS, but medicines can help decrease the number and frequency of attacks and help relieve nuisance symptoms. Treatment may also include physical or occupational therapy.  If you develop numbness, paralysis, vision problems, or other neurological symptoms, get help right away. This information is not intended to replace advice given to you by your health care provider. Make sure you discuss any questions you have with your health care provider. Document Revised: 04/09/2020 Document Reviewed: 04/09/2020 Elsevier Patient Education  2021 [employer]vier Inc.  

## 2020-10-01 NOTE — Telephone Encounter (Signed)
Placed in outbox JCV ANTIBODY sample.

## 2020-10-02 ENCOUNTER — Telehealth: Payer: Self-pay

## 2020-10-02 LAB — CBC WITH DIFFERENTIAL/PLATELET
Basophils Absolute: 0.1 10*3/uL (ref 0.0–0.2)
Basos: 1 %
EOS (ABSOLUTE): 0.2 10*3/uL (ref 0.0–0.4)
Eos: 2 %
Hematocrit: 35 % (ref 34.0–46.6)
Hemoglobin: 11 g/dL — ABNORMAL LOW (ref 11.1–15.9)
Immature Grans (Abs): 0 10*3/uL (ref 0.0–0.1)
Immature Granulocytes: 0 %
Lymphocytes Absolute: 4.2 10*3/uL — ABNORMAL HIGH (ref 0.7–3.1)
Lymphs: 53 %
MCH: 27 pg (ref 26.6–33.0)
MCHC: 31.4 g/dL — ABNORMAL LOW (ref 31.5–35.7)
MCV: 86 fL (ref 79–97)
Monocytes Absolute: 0.6 10*3/uL (ref 0.1–0.9)
Monocytes: 7 %
Neutrophils Absolute: 3 10*3/uL (ref 1.4–7.0)
Neutrophils: 37 %
Platelets: 258 10*3/uL (ref 150–450)
RBC: 4.07 x10E6/uL (ref 3.77–5.28)
RDW: 13.9 % (ref 11.7–15.4)
WBC: 7.9 10*3/uL (ref 3.4–10.8)

## 2020-10-02 LAB — VITAMIN D 25 HYDROXY (VIT D DEFICIENCY, FRACTURES): Vit D, 25-Hydroxy: 44.7 ng/mL (ref 30.0–100.0)

## 2020-10-02 NOTE — Telephone Encounter (Signed)
Spoke to patients sister Rollene Fare, per DPR/POA, regarding lab results from [facility]. I informed her that her labs are stable and to continue with current treatment plan. I advised her to contact the office with further questions, she understood

## 2020-10-07 DIAGNOSIS — G35 Multiple sclerosis: Secondary | ICD-10-CM | POA: Diagnosis not present

## 2020-10-07 LAB — RFLX STRATIFY JCV (TM) AB INHIBITION: JCV Antibody by Inhibition: NEGATIVE

## 2020-10-07 LAB — STRATIFY JCV AB (W/ INDEX) W/ RFLX
Index Value: 0.32 — ABNORMAL HIGH
Stratify JCV (TM) Ab w/Reflex Inhibition: UNDETERMINED — AB

## 2020-10-21 ENCOUNTER — Other Ambulatory Visit: Payer: Self-pay | Admitting: Neurology

## 2020-11-18 DIAGNOSIS — G35 Multiple sclerosis: Secondary | ICD-10-CM | POA: Diagnosis not present

## 2020-12-03 ENCOUNTER — Ambulatory Visit (INDEPENDENT_AMBULATORY_CARE_PROVIDER_SITE_OTHER): Payer: Medicare HMO | Admitting: Internal Medicine

## 2020-12-03 ENCOUNTER — Encounter: Payer: Self-pay | Admitting: Internal Medicine

## 2020-12-03 ENCOUNTER — Ambulatory Visit (INDEPENDENT_AMBULATORY_CARE_PROVIDER_SITE_OTHER): Payer: Medicare HMO

## 2020-12-03 ENCOUNTER — Other Ambulatory Visit: Payer: Self-pay

## 2020-12-03 VITALS — BP 104/66 | HR 63 | Temp 98.4°F | Ht 67.0 in | Wt 177.0 lb

## 2020-12-03 DIAGNOSIS — E559 Vitamin D deficiency, unspecified: Secondary | ICD-10-CM

## 2020-12-03 DIAGNOSIS — Z0000 Encounter for general adult medical examination without abnormal findings: Secondary | ICD-10-CM | POA: Diagnosis not present

## 2020-12-03 DIAGNOSIS — E785 Hyperlipidemia, unspecified: Secondary | ICD-10-CM | POA: Insufficient documentation

## 2020-12-03 DIAGNOSIS — D539 Nutritional anemia, unspecified: Secondary | ICD-10-CM | POA: Diagnosis not present

## 2020-12-03 DIAGNOSIS — G8929 Other chronic pain: Secondary | ICD-10-CM | POA: Diagnosis not present

## 2020-12-03 DIAGNOSIS — G35 Multiple sclerosis: Secondary | ICD-10-CM | POA: Diagnosis not present

## 2020-12-03 DIAGNOSIS — Z23 Encounter for immunization: Secondary | ICD-10-CM | POA: Diagnosis not present

## 2020-12-03 DIAGNOSIS — F3341 Major depressive disorder, recurrent, in partial remission: Secondary | ICD-10-CM | POA: Diagnosis not present

## 2020-12-03 DIAGNOSIS — Z1231 Encounter for screening mammogram for malignant neoplasm of breast: Secondary | ICD-10-CM

## 2020-12-03 DIAGNOSIS — M25561 Pain in right knee: Secondary | ICD-10-CM | POA: Diagnosis not present

## 2020-12-03 DIAGNOSIS — Z124 Encounter for screening for malignant neoplasm of cervix: Secondary | ICD-10-CM | POA: Insufficient documentation

## 2020-12-03 DIAGNOSIS — M542 Cervicalgia: Secondary | ICD-10-CM

## 2020-12-03 DIAGNOSIS — Z0001 Encounter for general adult medical examination with abnormal findings: Secondary | ICD-10-CM

## 2020-12-03 LAB — CBC WITH DIFFERENTIAL/PLATELET
Basophils Absolute: 0 10*3/uL (ref 0.0–0.1)
Basophils Relative: 0.6 % (ref 0.0–3.0)
Eosinophils Absolute: 0.1 10*3/uL (ref 0.0–0.7)
Eosinophils Relative: 2.1 % (ref 0.0–5.0)
HCT: 36.4 % (ref 36.0–46.0)
Hemoglobin: 11.8 g/dL — ABNORMAL LOW (ref 12.0–15.0)
Lymphocytes Relative: 51.1 % — ABNORMAL HIGH (ref 12.0–46.0)
Lymphs Abs: 3.5 10*3/uL (ref 0.7–4.0)
MCHC: 32.6 g/dL (ref 30.0–36.0)
MCV: 85.5 fl (ref 78.0–100.0)
Monocytes Absolute: 0.5 10*3/uL (ref 0.1–1.0)
Monocytes Relative: 6.8 % (ref 3.0–12.0)
Neutro Abs: 2.7 10*3/uL (ref 1.4–7.7)
Neutrophils Relative %: 39.4 % — ABNORMAL LOW (ref 43.0–77.0)
Platelets: 273 10*3/uL (ref 150.0–400.0)
RBC: 4.25 Mil/uL (ref 3.87–5.11)
RDW: 14.5 % (ref 11.5–15.5)
WBC: 6.9 10*3/uL (ref 4.0–10.5)

## 2020-12-03 LAB — IRON: Iron: 72 ug/dL (ref 42–145)

## 2020-12-03 LAB — LIPID PANEL
Cholesterol: 168 mg/dL (ref 0–200)
HDL: 69.9 mg/dL (ref >39.00)
LDL Cholesterol: 88 mg/dL (ref 0–99)
NonHDL: 97.89
Total CHOL/HDL Ratio: 2
Triglycerides: 51 mg/dL (ref 0.0–149.0)
VLDL: 10.2 mg/dL (ref 0.0–40.0)

## 2020-12-03 LAB — VITAMIN B12: Vitamin B-12: 589 pg/mL (ref 211–911)

## 2020-12-03 LAB — FOLATE: Folate: >24.4 ng/mL (ref >5.9)

## 2020-12-03 LAB — FERRITIN: Ferritin: 18.5 ng/mL (ref 10.0–291.0)

## 2020-12-03 NOTE — Patient Instructions (Addendum)
[facility] [address] [place], [place] [phone]      Health Maintenance, Female Adopting a healthy lifestyle and getting preventive care are important in promoting health and wellness. Ask your health care provider about:  The right schedule for you to have regular tests and exams.  Things you can do on your own to prevent diseases and keep yourself healthy. What should I know about diet, weight, and exercise? Eat a healthy diet  Eat a diet that includes plenty of vegetables, fruits, low-fat dairy products, and lean protein.  Do not eat a lot of foods that are high in solid fats, added sugars, or sodium.   Maintain a healthy weight Body mass index (BMI) is used to identify weight problems. It estimates body fat based on height and weight. Your health care provider can help determine your BMI and help you achieve or maintain a healthy weight. Get regular exercise Get regular exercise. This is one of the most important things you can do for your health. Most adults should:  Exercise for at least 150 minutes each week. The exercise should increase your heart rate and make you sweat (moderate-intensity exercise).  Do strengthening exercises at least twice a week. This is in addition to the moderate-intensity exercise.  Spend less time sitting. Even light physical activity can be beneficial. Watch cholesterol and blood lipids Have your blood tested for lipids and cholesterol at 42 years of age, then have this test every 5 years. Have your cholesterol levels checked more often if:  Your lipid or cholesterol levels are high.  You are older than 42 years of age.  You are at high risk for heart disease. What should I know about cancer screening? Depending on your health history and family history, you may need to have cancer screening at various ages. This may include screening for:  Breast cancer.  Cervical cancer.  Colorectal cancer.  Skin  cancer.  Lung cancer. What should I know about heart disease, diabetes, and high blood pressure? Blood pressure and heart disease  High blood pressure causes heart disease and increases the risk of stroke. This is more likely to develop in people who have high blood pressure readings, are of African descent, or are overweight.  Have your blood pressure checked: ? Every 3-5 years if you are 35-58 years of age. ? Every year if you are 79 years old or older. Diabetes Have regular diabetes screenings. This checks your fasting blood sugar level. Have the screening done:  Once every three years after age 17 if you are at a normal weight and have a low risk for diabetes.  More often and at a younger age if you are overweight or have a high risk for diabetes. What should I know about preventing infection? Hepatitis B If you have a higher risk for hepatitis B, you should be screened for this virus. Talk with your health care provider to find out if you are at risk for hepatitis B infection. Hepatitis C Testing is recommended for:  Everyone born from [birth year] through 1967.  Anyone with known risk factors for hepatitis C. Sexually transmitted infections (STIs)  Get screened for STIs, including gonorrhea and chlamydia, if: ? You are sexually active and are younger than 42 years of age. ? You are older than 42 years of age and your health care provider tells you that you are at risk for this type of infection. ? Your sexual activity has changed since you were  last screened, and you are at increased risk for chlamydia or gonorrhea. Ask your health care provider if you are at risk.  Ask your health care provider about whether you are at high risk for HIV. Your health care provider may recommend a prescription medicine to help prevent HIV infection. If you choose to take medicine to prevent HIV, you should first get tested for HIV. You should then be tested every 3 months for as long as you are taking  the medicine. Pregnancy  If you are about to stop having your period (premenopausal) and you may become pregnant, seek counseling before you get pregnant.  Take 400 to 800 micrograms (mcg) of folic acid every day if you become pregnant.  Ask for birth control (contraception) if you want to prevent pregnancy. Osteoporosis and menopause Osteoporosis is a disease in which the bones lose minerals and strength with aging. This can result in bone fractures. If you are 83 years old or older, or if you are at risk for osteoporosis and fractures, ask your health care provider if you should:  Be screened for bone loss.  Take a calcium or vitamin D supplement to lower your risk of fractures.  Be given hormone replacement therapy (HRT) to treat symptoms of menopause. Follow these instructions at home: Lifestyle  Do not use any products that contain nicotine or tobacco, such as cigarettes, e-cigarettes, and chewing tobacco. If you need help quitting, ask your health care provider.  Do not use street drugs.  Do not share needles.  Ask your health care provider for help if you need support or information about quitting drugs. Alcohol use  Do not drink alcohol if: ? Your health care provider tells you not to drink. ? You are pregnant, may be pregnant, or are planning to become pregnant.  If you drink alcohol: ? Limit how much you use to 0-1 drink a day. ? Limit intake if you are breastfeeding.  Be aware of how much alcohol is in your drink. In the [place] one drink equals one 12 oz bottle of beer (355 mL), one 5 oz glass of wine (148 mL), or one 1 oz glass of hard liquor (44 mL). General instructions  Schedule regular health, dental, and eye exams.  Stay current with your vaccines.  Tell your health care provider if: ? You often feel depressed. ? You have ever been abused or do not feel safe at home. Summary  Adopting a healthy lifestyle and getting preventive care are important in  promoting health and wellness.  Follow your health care provider's instructions about healthy diet, exercising, and getting tested or screened for diseases.  Follow your health care provider's instructions on monitoring your cholesterol and blood pressure. This information is not intended to replace advice given to you by your health care provider. Make sure you discuss any questions you have with your health care provider. Document Revised: 06/22/2018 Document Reviewed: 06/22/2018 Elsevier Patient Education  2021 [employer].

## 2020-12-03 NOTE — Progress Notes (Signed)
Subjective:  Patient ID: Mary Jackson, female    DOB: 1981-03-14  Age: 40 y.o. MRN: [id number]  CC: Annual Exam and Anemia  This visit occurred during the SARS-CoV-2 public health emergency.  Safety protocols were in place, including screening questions prior to the visit, additional usage of staff PPE, and extensive cleaning of exam room while observing appropriate contact time as indicated for disinfecting solutions.    HPI Mary Jackson presents for a CPX, f/up, and to establish.  She is being treated for MS.  She has a history of mild anemia.  She complains of chronic right-sided neck pain that does not radiate towards her extremities.  She has had no recent trauma or injury.  And says she has had no paresthesias in her upper extremities.  She takes ibuprofen to control the pain.  She complains of anxiety doing and depression and wants to be seen by a psychiatrist and a psychotherapist.  She also complains of chronic knee pain.  Worse on the right than the left.   History Mary Jackson has a past medical history of Depression with anxiety and MS (multiple sclerosis) ([place]).   She has no past surgical history on file.   Her family history includes Diabetes in her sister; Hypertension in her sister.She reports that she has been smoking cigarettes. She has never used smokeless tobacco. She reports current alcohol use of about 6.0 standard drinks of alcohol per week. She reports current drug use. Drug: Marijuana.  Outpatient Medications Prior to Visit  Medication Sig Dispense Refill  . Ascorbic Acid (VITAMIN C PO) Take 1,000 mg by mouth daily.    . baclofen (LIORESAL) 10 MG tablet Take 1 tablet (10 mg total) by mouth 3 (three) times daily. 270 each 3  . Cholecalciferol (VITAMIN D3 PO) Take 5,000 Units by mouth daily.    . Cyanocobalamin (VITAMIN B-12 PO) Take 1,000 mcg by mouth daily.    Marland Kitchen dalfampridine 10 MG TB12 Take 1 tablet (10 mg total) by mouth 2 (two) times daily. 60 tablet 11  .  ferrous sulfate 324 MG TBEC Take 324 mg by mouth.    . gabapentin (NEURONTIN) 300 MG capsule Take 1 capsule (300 mg total) by mouth 3 (three) times daily. 270 capsule 3  . IBU 800 MG tablet TAKE 1 TABLET TWICE DAILY 180 tablet 1  . natalizumab (TYSABRI) 300 MG/15ML injection Inject into the vein.    Marland Kitchen sertraline (ZOLOFT) 100 MG tablet Take 1 tablet (100 mg total) by mouth daily. 90 tablet 3  . VITAMIN E PO Take 400 Units by mouth daily.     No facility-administered medications prior to visit.    ROS Review of Systems  Constitutional: Negative for chills, diaphoresis, fatigue and fever.  HENT: Negative.   Respiratory: Negative.  Negative for cough, chest tightness, shortness of breath and wheezing.   Cardiovascular: Negative for chest pain, palpitations and leg swelling.  Gastrointestinal: Negative for abdominal pain, blood in stool, constipation, diarrhea, nausea and vomiting.  Endocrine: Positive for cold intolerance. Negative for heat intolerance.  Genitourinary: Negative.  Negative for difficulty urinating.  Musculoskeletal: Positive for gait problem and neck pain. Negative for arthralgias, back pain, joint swelling and neck stiffness.  Skin: Negative.  Negative for color change and pallor.  Neurological: Negative for dizziness and headaches.  Hematological: Negative for adenopathy. Does not bruise/bleed easily.  Psychiatric/Behavioral: Positive for dysphoric mood (irritable). Negative for agitation, sleep disturbance and suicidal ideas. The patient is nervous/anxious.     Objective:  BP 104/66 (BP Location: Right Arm, Patient Position: Sitting, Cuff Size: Large)   Pulse 63   Temp 98.4 F (36.9 C) (Oral)   Ht 5\' 7"  (1.702 m)   Wt 177 lb (80.3 kg)   LMP 12/02/2020   SpO2 99%   BMI 27.72 kg/m   Physical Exam Constitutional:      General: She is not in acute distress.    Appearance: She is normal weight. She is ill-appearing (she uses a walker). She is not toxic-appearing or  diaphoretic.  HENT:     Nose: Nose normal.     Mouth/Throat:     Mouth: Mucous membranes are moist.  Eyes:     General: No scleral icterus.    Conjunctiva/sclera: Conjunctivae normal.  Neck:     Thyroid: No thyromegaly.  Cardiovascular:     Rate and Rhythm: Normal rate and regular rhythm.     Heart sounds: No murmur heard.   Pulmonary:     Effort: Pulmonary effort is normal.     Breath sounds: No wheezing, rhonchi or rales.  Abdominal:     General: Abdomen is flat.     Tenderness: There is no abdominal tenderness. There is no guarding.     Hernia: No hernia is present.  Musculoskeletal:        General: Normal range of motion.     Cervical back: Normal, normal range of motion and neck supple. No spasms, tenderness or bony tenderness. No pain with movement. Normal range of motion.     Thoracic back: Normal.     Lumbar back: Normal.     Right lower leg: No edema.     Left lower leg: No edema.  Lymphadenopathy:     Cervical: No cervical adenopathy.  Skin:    General: Skin is warm and dry.     Coloration: Skin is not jaundiced.     Findings: Rash present.  Neurological:     Mental Status: She is alert. Mental status is at baseline.     Motor: Weakness present.     Coordination: Coordination abnormal.     Gait: Gait abnormal.     Deep Tendon Reflexes: Reflexes abnormal.  Psychiatric:        Mood and Affect: Mood normal.        Behavior: Behavior normal.     Lab Results  Component Value Date   WBC 6.9 12/03/2020   HGB 11.8 (L) 12/03/2020   HCT 36.4 12/03/2020   PLT 273.0 12/03/2020   GLUCOSE 57 (L) 10/02/2019   CHOL 168 12/03/2020   TRIG 51.0 12/03/2020   HDL 69.90 12/03/2020   LDLCALC 88 12/03/2020   ALT 14 10/02/2019   AST 15 10/02/2019   NA 141 10/02/2019   K 4.1 10/02/2019   CL 107 (H) 10/02/2019   CREATININE 0.73 10/02/2019   BUN 15 10/02/2019   CO2 23 10/02/2019   TSH 0.73 12/03/2020   Narrative & Impression  CLINICAL DATA:  Chronic neck  pain.  EXAM: CERVICAL SPINE - COMPLETE 4+ VIEW  COMPARISON:  None.  FINDINGS: No fracture or spondylolisthesis is noted. Mild anterior osteophyte formation is noted at C5-6 and C6-7. No neural foraminal stenosis is noted.  IMPRESSION: Mild multilevel degenerative disc disease. No acute abnormality seen.   Electronically Signed   By: Marijo Conception M.D.   On: 12/04/2020 16:57     Assessment & Plan:   Lekisha was seen today for annual exam and anemia.  Diagnoses and all  orders for this visit:  Visit for screening mammogram -     MM DIGITAL SCREENING BILATERAL; Future  Deficiency anemia- Her H&H have improved.  Her vitamin levels are normal.  This is likely the anemia of chronic disease related to MS. -     Vitamin B12; Future -     Iron; Future -     Folate; Future -     Vitamin B1; Future -     CBC with Differential/Platelet; Future -     Ferritin; Future -     Reticulocytes; Future -     Reticulocytes -     Ferritin -     CBC with Differential/Platelet -     Vitamin B1 -     Folate -     Iron -     Vitamin B12  Encounter for general adult medical examination with abnormal findings- Exam completed, labs reviewed, vaccines reviewed and updated, cancer screenings addressed, patient education was given.  Cervical cancer screening -     Ambulatory referral to Gynecology  Secondary progressive multiple sclerosis ([place])  Vitamin D deficiency- Her vitamin D level is normal now. -     VITAMIN D 25 Hydroxy (Vit-D Deficiency, Fractures); Future  Recurrent major depressive disorder, in partial remission ([place])- She will continue the current dose of sertraline.  Labs are negative for secondary causes.  I referred her to psychiatry. -     Ambulatory referral to Psychiatry -     Thyroid Panel With TSH; Future -     Thyroid Panel With TSH  Dyslipidemia, goal LDL below 160- Statin therapy is not indicated. -     Lipid panel; Future -     Thyroid Panel With TSH;  Future -     Thyroid Panel With TSH -     Lipid panel  Need for vaccination -     Pneumococcal conjugate vaccine 20-valent  Neck pain, chronic- Her C-spine film showed mild DDD.  There is no evidence of radiculopathy.  She will continue ibuprofen as needed for the pain. -     DG Cervical Spine Complete; Future  Chronic pain of right knee -     Ambulatory referral to Sports Medicine  Other orders -     Tdap vaccine greater than or equal to 7yo IM   I am having [place] maintain her ferrous sulfate, Cholecalciferol (VITAMIN D3 PO), Ascorbic Acid (VITAMIN C PO), VITAMIN E PO, Cyanocobalamin (VITAMIN B-12 PO), Tysabri, dalfampridine, baclofen, gabapentin, sertraline, and IBU.  No orders of the defined types were placed in this encounter.    Follow-up: Return in about 6 months (around 06/05/2021).  Scarlette Calico, MD

## 2020-12-03 NOTE — Progress Notes (Signed)
Pt has been informed that the recommended vaccine TDAP, may not be covered under their current Medicare insurance. Discussed that they will possibly incur a bill for the TDAP vaccine & administration fee.  Pt understands that if they want the TDAP vaccine, Medicare will be billed for an official decision on payment, which will be sent to them in a Medicare Summary Notice (MSN). They understand that if Medicare does not pay, they are responsible for payment.     

## 2020-12-04 ENCOUNTER — Encounter: Payer: Self-pay | Admitting: Internal Medicine

## 2020-12-07 LAB — THYROID PANEL WITH TSH
Free Thyroxine Index: 1.9 (ref 1.4–3.8)
T3 Uptake: 31 % (ref 22–35)
T4, Total: 6.1 ug/dL (ref 5.1–11.9)
TSH: 0.73 mIU/L

## 2020-12-07 LAB — RETICULOCYTES
ABS Retic: 37980 cells/uL (ref 20000–80000)
Retic Ct Pct: 0.9 %

## 2020-12-07 LAB — VITAMIN B1: Vitamin B1 (Thiamine): 17 nmol/L (ref 8–30)

## 2020-12-08 ENCOUNTER — Encounter: Payer: Self-pay | Admitting: Internal Medicine

## 2020-12-09 ENCOUNTER — Encounter: Payer: Self-pay | Admitting: Internal Medicine

## 2020-12-16 ENCOUNTER — Ambulatory Visit: Payer: Self-pay

## 2020-12-16 ENCOUNTER — Ambulatory Visit (INDEPENDENT_AMBULATORY_CARE_PROVIDER_SITE_OTHER): Payer: Medicare HMO | Admitting: Family Medicine

## 2020-12-16 ENCOUNTER — Ambulatory Visit (INDEPENDENT_AMBULATORY_CARE_PROVIDER_SITE_OTHER): Payer: Medicare HMO

## 2020-12-16 ENCOUNTER — Encounter: Payer: Self-pay | Admitting: Family Medicine

## 2020-12-16 ENCOUNTER — Other Ambulatory Visit: Payer: Self-pay

## 2020-12-16 VITALS — BP 110/72 | HR 84 | Ht 67.0 in

## 2020-12-16 DIAGNOSIS — G35 Multiple sclerosis: Secondary | ICD-10-CM | POA: Diagnosis not present

## 2020-12-16 DIAGNOSIS — M25561 Pain in right knee: Secondary | ICD-10-CM | POA: Diagnosis not present

## 2020-12-16 DIAGNOSIS — G8929 Other chronic pain: Secondary | ICD-10-CM

## 2020-12-16 NOTE — Patient Instructions (Addendum)
Thank you for coming in today.  Please get an Xray today before you leave  I've referred you to Physical Therapy.  Let us know if you don't hear from them in one week.  Work on those home exercises. View at [email or web address] using code: [phone]  Recheck with me in 6 weeks.   Let me know sooner if this is not working.   Please use Voltaren gel (Generic Diclofenac Gel) up to 4x daily for pain as needed.  This is available over-the-counter as both the name brand Voltaren gel and the generic diclofenac gel.

## 2020-12-16 NOTE — Progress Notes (Signed)
Subjective:    CC: Bilateral knee pain  I, Wendy Poet, LAT, ATC, am serving as scribe for Dr. Lynne Leader.  HPI: Pt is a 40 y/o female presenting w/ chronic B knee pain, R>L.  She locates her pain to the anterior aspect of R knee w/ radiating pain into R lower leg.  Of note, pt is currently being treated for MS and is ambulating w/ a walker. Pt c/o instability and has suffered falls in the past.  She denies any recent falls.  She notes pain is worse with activity and better with rest.  She has not had physical therapy recently for her MS and never has had physical therapy for her knee pain.    Dr Felecia Shelling neurologist  Knee swelling: no Knee mechanical symptoms: no Aggravating factors: knee flexion Treatments tried: IBU  Pertinent review of Systems: No fevers or chills  Relevant historical information: MS   Objective:    Vitals:   12/16/20 1116  BP: 110/72  Pulse: 84  SpO2: 98%   General: Fatigued appearing woman in no acute distress.  MSK: Right knee decreased quad bulk.  Normal knee motion with minimal crepitation.  Mildly tender palpation medial joint line. Quad strength diminished 4/5 to extension and flexion. Stable ligamentous exam. Negative Murray's test.  Gait somewhat discoordinated using a walker to ambulate.  Patient locks her knees with gait  Lab and Radiology Results  X-ray images right knee obtained today personally and independently interpreted Mild medial compartment DJD Await formal radiology review   Procedure: Real-time Ultrasound Guided Injection of right knee superior lateral patellar space Device: Philips Affiniti 50G Images permanently stored and available for review in PACS Ultrasound evaluation prior to injection reveals narrowed medial joint line otherwise unremarkable. Verbal informed consent obtained.  Discussed risks and benefits of procedure. Warned about infection bleeding damage to structures skin hypopigmentation and fat atrophy  among others. Patient expresses understanding and agreement Time-out conducted.   Noted no overlying erythema, induration, or other signs of local infection.   Skin prepped in a sterile fashion.   Local anesthesia: Topical Ethyl chloride.   With sterile technique and under real time ultrasound guidance:  40 mg of Kenalog and 2 mL of Marcaine injected into knee joint. Fluid seen entering the joint capsule.   Completed without difficulty   Pain immediately resolved suggesting accurate placement of the medication.   Advised to call if fevers/chills, erythema, induration, drainage, or persistent bleeding.   Images permanently stored and available for review in the ultrasound unit.  Impression: Technically successful ultrasound guided injection.       Impression and Recommendations:    Assessment and Plan: 40 y.o. female with right knee pain.  Patient does have some mild degenerative changes however I believe the majority of her pain is due to patellofemoral pain syndrome.  Fundamentally patient has weakness of her quadriceps and hip abductors which I think is the main reason her knee is hurting.  However she has MS and difficulty walking I think that is fundamentally why her quadriceps are weak.  MS could directly be impacting her quadricep strength its self.   Plan for referral to physical therapy.  She does have transportation to PT with the [employer] bus.  Plan to referral to neuro rehab as she will benefit from some balance and coordination physical therapy as well.  Additionally will use Voltaren gel and knee injection.  Recheck 6 weeks.  CC PCP and neurology. PDMP not reviewed this encounter.  Orders Placed This Encounter  Procedures  . [place] LIMITED JOINT SPACE STRUCTURES LOW BILAT(NO LINKED CHARGES)    Standing Status:   Future    Number of Occurrences:   1    Standing Expiration Date:   06/17/2021    Order Specific Question:   Reason for Exam (SYMPTOM  OR DIAGNOSIS REQUIRED)    Answer:    bilateral knee pain    Order Specific Question:   Preferred imaging location?    Answer:   [facility]  . DG Knee AP/LAT W/[facility] Right    Standing Status:   Future    Number of Occurrences:   1    Standing Expiration Date:   12/16/2021    Order Specific Question:   Reason for Exam (SYMPTOM  OR DIAGNOSIS REQUIRED)    Answer:   right knee pain    Order Specific Question:   Preferred imaging location?    Answer:   [facility]    Order Specific Question:   Is patient pregnant?    Answer:   No  . Ambulatory referral to Physical Therapy    Referral Priority:   Routine    Referral Type:   Physical Medicine    Referral Reason:   Specialty Services Required    Requested Specialty:   Physical Therapy   No orders of the defined types were placed in this encounter.   Discussed warning signs or symptoms. Please see discharge instructions. Patient expresses understanding.   The above documentation has been reviewed and is accurate and complete Lynne Leader, M.D.

## 2020-12-17 NOTE — Progress Notes (Signed)
Right knee x-ray looks normal to radiology

## 2020-12-30 DIAGNOSIS — G35 Multiple sclerosis: Secondary | ICD-10-CM | POA: Diagnosis not present

## 2020-12-31 ENCOUNTER — Ambulatory Visit: Payer: Medicare HMO | Attending: Family Medicine | Admitting: Physical Therapy

## 2020-12-31 ENCOUNTER — Other Ambulatory Visit: Payer: Self-pay

## 2020-12-31 DIAGNOSIS — M6281 Muscle weakness (generalized): Secondary | ICD-10-CM | POA: Diagnosis not present

## 2020-12-31 DIAGNOSIS — R262 Difficulty in walking, not elsewhere classified: Secondary | ICD-10-CM | POA: Diagnosis not present

## 2020-12-31 DIAGNOSIS — R29818 Other symptoms and signs involving the nervous system: Secondary | ICD-10-CM | POA: Insufficient documentation

## 2020-12-31 DIAGNOSIS — M25561 Pain in right knee: Secondary | ICD-10-CM | POA: Insufficient documentation

## 2020-12-31 DIAGNOSIS — R2689 Other abnormalities of gait and mobility: Secondary | ICD-10-CM | POA: Insufficient documentation

## 2020-12-31 NOTE — Therapy (Signed)
[facility] [address] [place], [place], [address] Phone: [phone]   Fax:  [phone]  Physical Therapy Evaluation  Patient Details  Name: Mary Jackson MRN: [id number] Date of Birth: [birth year] Referring Provider (PT): Dr. Georgina Snell   Encounter Date: 12/31/2020   PT End of Session - 12/31/20 1441     Visit Number 1    Number of Visits 7    Date for PT Re-Evaluation 03/31/21    Authorization Type [employer]    PT Start Time 1101    PT Stop Time 1144    PT Time Calculation (min) 43 min    Equipment Utilized During Treatment Gait belt    Activity Tolerance Patient tolerated treatment well    Behavior During Therapy WFL for tasks assessed/performed             Past Medical History:  Diagnosis Date   Depression with anxiety    MS (multiple sclerosis) ([place])     No past surgical history on file.  There were no vitals filed for this visit.    Subjective Assessment - 12/31/20 1104     Subjective Diagnosed with MS in 2008. Per Dr. Felecia Shelling pt with relapsing/active secondary progressive MS. Reports R knee tenses up, saw Dr. Georgina Snell recently for R  knee pain and reports that he gave her some exercises, has been working on them and knee has been feeling better. Was also given a cortisone shot by Dr. Georgina Snell to her R knee and it has helped a lot. Uses her rollator at all times. Last fall was years ago.    Patient is accompained by: Family member   sister, Rollene Fare   Pertinent History MS, depression    Limitations Standing;Walking    How long can you walk comfortably? --    Patient Stated Goals wants to move around better, does not want to use a rollator.    Currently in Pain? No/denies                [facility] PT Assessment - 12/31/20 1109       Assessment   Medical Diagnosis MS    Referring Provider (PT) Dr. Georgina Snell    Onset Date/Surgical Date 12/16/20   date of referral, diagnosed with MS in 2008   Hand  Dominance Right    Prior Therapy last PT was a couple years ago      Precautions   Precautions Fall      Balance Screen   Has the patient fallen in the past 6 months No    Has the patient had a decrease in activity level because of a fear of falling?  No    Is the patient reluctant to leave their home because of a fear of falling?  No      Home Environment   Living Environment Private residence    Living Arrangements Other relatives   sister and nephew or at her other niece's house   Available Help at Discharge Family;Other (Comment)    Type of [place] to enter    Entrance Stairs-Number of Steps 10    Entrance Stairs-Rails Right    Home Layout One level    Home Equipment Other (comment)   rollator,   Additional Comments family helps with cooking, cleaning      Prior Function   Level of Independence Requires assistive device for independence;Independent with basic ADLs    Leisure watching TV, read  Sensation   Light Touch Impaired Detail   able to detect Bilat, but lighter on LLE     Coordination   Gross Motor Movements are Fluid and Coordinated No    Fine Motor Movements are Fluid and Coordinated No    Coordination and Movement Description ataxic B, more impaired with LUE with overshooting    Heel Shin Test dysmetric B      Posture/Postural Control   Posture/Postural Control Postural limitations    Postural Limitations Flexed trunk;Forward head      ROM / Strength   AROM / PROM / Strength Strength      Strength   Strength Assessment Site Hip;Knee;Ankle    Right/Left Hip Right;Left    Right Hip Flexion 3+/5   compensatory lean to L   Right Hip Extension 3-/5    Right Hip ABduction 3-/5    Left Hip Flexion 3+/5    Left Hip Extension 2+/5    Left Hip ABduction 4-/5    Right/Left Knee Right;Left    Right Knee Flexion 5/5    Right Knee Extension 5/5    Left Knee Flexion 5/5    Left Knee Extension 5/5    Right/Left Ankle Right;Left     Right Ankle Dorsiflexion 5/5    Left Ankle Dorsiflexion 4+/5      Transfers   Transfers Sit to Stand;Stand to Sit    Sit to Stand 5: Supervision;With upper extremity assist;From chair/3-in-1    Five time sit to stand comments  24.94 seconds from standard height chair    Stand to Sit 5: Supervision;With upper extremity assist      Ambulation/Gait   Ambulation/Gait Yes    Ambulation/Gait Assistance 5: Supervision;4: Min guard    Ambulation/Gait Assistance Details Pt with increased R circumduction to clear RLE, B knee extension during gait    Assistive device Rollator    Gait Pattern Step-through pattern;Step-to pattern;Decreased stance time - left;Decreased hip/knee flexion - right;Decreased hip/knee flexion - left;Decreased dorsiflexion - right;Decreased dorsiflexion - left;Right circumduction;Left foot flat;Right foot flat;Lateral hip instability;Wide base of support;Trunk flexed;Poor foot clearance - left;Poor foot clearance - right    Ambulation Surface Level;Indoor    Gait velocity 28.31 seconds = 1.16 ft/sec      Timed Up and Go Test   Normal TUG (seconds) 25.81   with rollator                       Objective measurements completed on examination: See above findings.               PT Education - 12/31/20 1440     Education Details clinical findings, POC, scheduling 1x a week every 2 weeks due to copay/finances    Person(s) Educated Patient   pt's sister   Methods Explanation    Comprehension Verbalized understanding              PT Short Term Goals - 12/31/20 1443       PT SHORT TERM GOAL #1   Title Patient will be independent with initial HEP in order to build upon functional gains made in therapy. ALL STGS DUE 01/28/21    Time 4    Period Weeks    Status New    Target Date 01/28/21      PT SHORT TERM GOAL #2   Title Patient will decrease TUG time to 23 seconds or less in order to demo decreased fall risk.    Baseline 25.81  seconds with  rollator    Time 4    Period Weeks    Status New      PT SHORT TERM GOAL #3   Title Patient will ambulate at least 55' with rollator and supervision in order to improve household and community mobility.    Time 4    Period Weeks    Status New               PT Long Term Goals - 12/31/20 1444       PT LONG TERM GOAL #1   Title Patient will improve gait speed with rollator to at least 1.5 ft/sec in order to demo decreased fall risk. ALL LTGS DUE 02/25/21    Baseline 1.16 ft/sec    Time 8   written 12 weeks out due to scheduling   Period Weeks    Status New    Target Date 02/25/21      PT LONG TERM GOAL #2   Title Patient will decrease TUG time to 20 seconds or less in order to demo decreased fall risk.    Baseline 25.81 seconds with rollator    Time 8    Period Weeks    Status New      PT LONG TERM GOAL #3   Title Patient will decrease 5x sit <> stand score to 20 seconds or less from standard height chair with UE support in order to demo improved strength and decr fall risk.    Baseline 24.94 seconds    Time 8    Period Weeks    Status New                    Plan - 12/31/20 1447     Clinical Impression Statement Patient is a 40 year old female referred to Neuro OPPT for evaluation with gait abnormalities and imbalance due to MS and R knee pain. Pt's PMH is significant for: MS, depression. Pt reporting that her R knee is feeling much better after getting cortisone shot from Dr. Georgina Snell.  The following deficits were present during the exam: decreased coordination, decreased endurance, decreased BLE strength, impaired balance, gait abnormalities, impaired sensation, postural abnormalities. Pt's TUG, gait speed, and 5x sit <> stand  scores indicate pt is at a high risk for falls. Due to pt's co-pay, pt only able to be seen 1x a week every 2 weeks.Pt would benefit from skilled PT to address these impairments and functional limitations to maximize functional mobility  independence and decreasing fall risk.    Personal Factors and Comorbidities Age;Comorbidity 2;Finances;Past/Current Experience;Time since onset of injury/illness/exacerbation    Comorbidities MS, depression    Examination-Activity Limitations Locomotion Level;Stand;Squat;Transfers    Examination-Participation Restrictions Community Activity;Cleaning;Laundry    Stability/Clinical Decision Making Evolving/Moderate complexity    Clinical Decision Making Moderate    Rehab Potential Good    PT Frequency Biweekly    PT Duration 12 weeks    PT Treatment/Interventions ADLs/Self Care Home Management;Aquatic Therapy;DME Instruction;Gait training;Stair training;Therapeutic activities;Functional mobility training;Therapeutic exercise;Balance training;Neuromuscular re-education;Orthotic Fit/Training;Patient/family education;Passive range of motion;Energy conservation;Vestibular;Manual techniques    PT Next Visit Plan initial HEP for LE strengthening (esp proximal hip muscles), ROM and balance.    Consulted and Agree with Plan of Care Patient;Family member/caregiver    Family Member Consulted pt's sister             Patient will benefit from skilled therapeutic intervention in order to improve the following deficits and impairments:  Decreased activity  tolerance, Abnormal gait, Decreased balance, Decreased coordination, Decreased endurance, Decreased mobility, Decreased range of motion, Difficulty walking, Decreased strength, Pain, Impaired sensation  Visit Diagnosis: Difficulty in walking, not elsewhere classified  Muscle weakness (generalized)  Other symptoms and signs involving the nervous system  Other abnormalities of gait and mobility  Right knee pain, unspecified chronicity     Problem List Patient Active Problem List   Diagnosis Date Noted   Deficiency anemia 12/03/2020   Visit for screening mammogram 12/03/2020   Encounter for general adult medical examination with abnormal  findings 12/03/2020   Cervical cancer screening 12/03/2020   Dyslipidemia, goal LDL below 160 12/03/2020   Need for vaccination 12/03/2020   Neck pain, chronic 12/03/2020   Chronic pain of right knee 12/03/2020   Secondary progressive multiple sclerosis ([place]) 04/03/2019   Vitamin D deficiency 04/03/2019   Depression 04/03/2019   High risk medication use 04/03/2019   Delayed sleep phase syndrome 04/03/2019    Arliss Journey, PT, DPT  12/31/2020, 2:56 PM  [facility] [address] [place], [place], [address] Phone: [phone]   Fax:  [phone]  Name: Mary Jackson MRN: [id number] Date of Birth: 1981/05/25

## 2021-01-07 ENCOUNTER — Other Ambulatory Visit: Payer: Self-pay

## 2021-01-07 ENCOUNTER — Ambulatory Visit: Payer: Medicare HMO | Admitting: Physical Therapy

## 2021-01-07 DIAGNOSIS — R2689 Other abnormalities of gait and mobility: Secondary | ICD-10-CM | POA: Diagnosis not present

## 2021-01-07 DIAGNOSIS — M6281 Muscle weakness (generalized): Secondary | ICD-10-CM | POA: Diagnosis not present

## 2021-01-07 DIAGNOSIS — R262 Difficulty in walking, not elsewhere classified: Secondary | ICD-10-CM

## 2021-01-07 DIAGNOSIS — R29818 Other symptoms and signs involving the nervous system: Secondary | ICD-10-CM | POA: Diagnosis not present

## 2021-01-07 DIAGNOSIS — M25561 Pain in right knee: Secondary | ICD-10-CM | POA: Diagnosis not present

## 2021-01-07 NOTE — Therapy (Addendum)
[facility] [address] [place], [place], [address] Phone: [phone]   Fax:  [phone]  Physical Therapy Treatment  Patient Details  Name: Mary Jackson MRN: [id number] Date of Birth: 1981-02-14 Referring Provider (PT): Dr. Georgina Snell   Encounter Date: 01/07/2021   PT End of Session - 01/07/21 1531     Visit Number 2    Number of Visits 7    Date for PT Re-Evaluation 03/31/21    Authorization Type [employer]    PT Start Time 1446    PT Stop Time 1528    PT Time Calculation (min) 42 min    Equipment Utilized During Treatment Gait belt    Activity Tolerance Patient tolerated treatment well    Behavior During Therapy WFL for tasks assessed/performed             Past Medical History:  Diagnosis Date   Depression with anxiety    MS (multiple sclerosis) ([place])     No past surgical history on file.  There were no vitals filed for this visit.   Subjective Assessment - 01/07/21 1450     Subjective No changes since she was last here.    Patient is accompained by: Family member   sister, Rollene Fare   Pertinent History MS, depression    Limitations Standing;Walking    Patient Stated Goals wants to move around better, does not want to use a rollator.    Currently in Pain? No/denies                               [facility] Adult PT Treatment/Exercise - 01/07/21 1451       Transfers   Transfers Sit to Stand;Stand to Sit    Sit to Stand 5: Supervision;With upper extremity assist;From chair/3-in-1    Stand to Sit 5: Supervision;With upper extremity assist    Comments x10 reps from mat table, cues for slowed descent      Ambulation/Gait   Gait Comments --      Therapeutic Activites    Therapeutic Activities Other Therapeutic Activities    Other Therapeutic Activities pt reporting that she used to have a brace for her RLE but she lost it and does not know where it is. states that she has an  AFO for her LLE (has it at home) - asked pt to bring it in next time. will work on getting a new order for an AFO for RLE to improve gait mechanics and safety with gait.      Exercises   Exercises Knee/Hip      Knee/Hip Exercises: Seated   Long Arc Quad Strengthening;AROM;Both;2 sets;10 reps    Long Arc Quad Limitations cues for technique    Other Seated Knee/Hip Exercises seated heel <> toe raises 2x 10 reps    Abd/Adduction Limitations 2 x 10 reps hip ADD pillow squeezes      Knee/Hip Exercises: Supine   Bridges Strengthening;AROM;Both;2 sets;10 reps    Other Supine Knee/Hip Exercises 2 x 10 reps alternating marching    Other Supine Knee/Hip Exercises hooklying bent knee fall outs: x10 reps B, cues for technique               Access Code: [email or web address] URL: [email or web address] Date: 01/07/2021 Prepared by: Janann August  Initial HEP - see [facility] for more details.   Exercises Supine March - 1 x daily - 5 x weekly - 2 sets -  10 reps Bent Knee Fallouts - 1 x daily - 5 x weekly - 2 sets - 10 reps Supine Bridge - 1 x daily - 5 x weekly - 2 sets - 10 reps Seated Long Arc Quad - 1 x daily - 5 x weekly - 2 sets - 10 reps Sit to Stand with Armchair - 2 x daily - 5 x weekly - 1 sets - 10 reps Seated Heel Toe Raises - 2 x daily - 5 x weekly - 2 sets - 10 reps     PT Education - 01/07/21 1523     Education Details initial HEP    Person(s) Educated Patient    Methods Explanation;Demonstration;Verbal cues;Handout    Comprehension Verbalized understanding;Returned demonstration              PT Short Term Goals - 12/31/20 1443       PT SHORT TERM GOAL #1   Title Patient will be independent with initial HEP in order to build upon functional gains made in therapy. ALL STGS DUE 01/28/21    Time 4    Period Weeks    Status New    Target Date 01/28/21      PT SHORT TERM GOAL #2   Title Patient will decrease TUG time to 23 seconds or less in order to demo  decreased fall risk.    Baseline 25.81 seconds with rollator    Time 4    Period Weeks    Status New      PT SHORT TERM GOAL #3   Title Patient will ambulate at least 67' with rollator and supervision in order to improve household and community mobility.    Time 4    Period Weeks    Status New               PT Long Term Goals - 12/31/20 1444       PT LONG TERM GOAL #1   Title Patient will improve gait speed with rollator to at least 1.5 ft/sec in order to demo decreased fall risk. ALL LTGS DUE 02/25/21    Baseline 1.16 ft/sec    Time 8   written 12 weeks out due to scheduling   Period Weeks    Status New    Target Date 02/25/21      PT LONG TERM GOAL #2   Title Patient will decrease TUG time to 20 seconds or less in order to demo decreased fall risk.    Baseline 25.81 seconds with rollator    Time 8    Period Weeks    Status New      PT LONG TERM GOAL #3   Title Patient will decrease 5x sit <> stand score to 20 seconds or less from standard height chair with UE support in order to demo improved strength and decr fall risk.    Baseline 24.94 seconds    Time 8    Period Weeks    Status New                   Plan - 01/07/21 1755     Clinical Impression Statement Today's skilled session focused on initiating HEP for BLE strengthening - in seated/supine. Pt tolerated session well, did need cues at times to stay on the task at hand due to becoming distracted. Pt used to have a R AFO but has lost it. PT to send referral request for new AFO to help improve gait mechanics and safety.  Will continue to progress towards LTGs.    Personal Factors and Comorbidities Age;Comorbidity 2;Finances;Past/Current Experience;Time since onset of injury/illness/exacerbation    Comorbidities MS, depression    Examination-Activity Limitations Locomotion Level;Stand;Squat;Transfers    Examination-Participation Restrictions Community Activity;Cleaning;Laundry    Stability/Clinical  Decision Making Evolving/Moderate complexity    Rehab Potential Good    PT Frequency Biweekly    PT Duration 12 weeks    PT Treatment/Interventions ADLs/Self Care Home Management;Aquatic Therapy;DME Instruction;Gait training;Stair training;Therapeutic activities;Functional mobility training;Therapeutic exercise;Balance training;Neuromuscular re-education;Orthotic Fit/Training;Patient/family education;Passive range of motion;Energy conservation;Vestibular;Manual techniques    PT Next Visit Plan pt might be bringing in her L AFO. BLE strengthening (esp proximal hip muscles), ROM and balance.    Consulted and Agree with Plan of Care Patient;Family member/caregiver    Family Member Consulted pt's sister             Patient will benefit from skilled therapeutic intervention in order to improve the following deficits and impairments:  Decreased activity tolerance, Abnormal gait, Decreased balance, Decreased coordination, Decreased endurance, Decreased mobility, Decreased range of motion, Difficulty walking, Decreased strength, Pain, Impaired sensation  Visit Diagnosis: Difficulty in walking, not elsewhere classified  Muscle weakness (generalized)  Other symptoms and signs involving the nervous system  Other abnormalities of gait and mobility     Problem List Patient Active Problem List   Diagnosis Date Noted   Deficiency anemia 12/03/2020   Visit for screening mammogram 12/03/2020   Encounter for general adult medical examination with abnormal findings 12/03/2020   Cervical cancer screening 12/03/2020   Dyslipidemia, goal LDL below 160 12/03/2020   Need for vaccination 12/03/2020   Neck pain, chronic 12/03/2020   Chronic pain of right knee 12/03/2020   Secondary progressive multiple sclerosis ([place]) 04/03/2019   Vitamin D deficiency 04/03/2019   Depression 04/03/2019   High risk medication use 04/03/2019   Delayed sleep phase syndrome 04/03/2019    Arliss Journey, PT, DPT   01/07/2021, 5:57 PM  [facility] [address] [place], [place], [address] Phone: [phone]   Fax:  [phone]  Name: Mary Jackson MRN: [id number] Date of Birth: 1980/09/10

## 2021-01-08 ENCOUNTER — Telehealth: Payer: Self-pay | Admitting: Physical Therapy

## 2021-01-08 ENCOUNTER — Other Ambulatory Visit: Payer: Self-pay | Admitting: Neurology

## 2021-01-08 DIAGNOSIS — G35 Multiple sclerosis: Secondary | ICD-10-CM

## 2021-01-08 DIAGNOSIS — M21371 Foot drop, right foot: Secondary | ICD-10-CM | POA: Insufficient documentation

## 2021-01-08 NOTE — Telephone Encounter (Signed)
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 The patient would benefit from an order for a R AFO to improve gait mechanics and safety with gait due to weakness. If you agree, please place an order in [facility] workque in [facility] or fax the order to [phone]. Thank you, Janann August, PT, DPT 01/08/21 9:28 AM    [facility] [address] [place],   [address] Phone:  [phone] Fax:  [phone]

## 2021-01-09 NOTE — Telephone Encounter (Signed)
Good morning, Dr. Felecia Shelling placed the order. I faxed it over to you this morning and received a fax confirmation. Thank you

## 2021-01-20 ENCOUNTER — Ambulatory Visit: Payer: Medicare HMO | Attending: Family Medicine | Admitting: Physical Therapy

## 2021-01-20 ENCOUNTER — Other Ambulatory Visit: Payer: Self-pay

## 2021-01-20 DIAGNOSIS — M6281 Muscle weakness (generalized): Secondary | ICD-10-CM | POA: Diagnosis not present

## 2021-01-20 DIAGNOSIS — M25561 Pain in right knee: Secondary | ICD-10-CM | POA: Insufficient documentation

## 2021-01-20 DIAGNOSIS — R262 Difficulty in walking, not elsewhere classified: Secondary | ICD-10-CM | POA: Insufficient documentation

## 2021-01-20 DIAGNOSIS — R29818 Other symptoms and signs involving the nervous system: Secondary | ICD-10-CM | POA: Insufficient documentation

## 2021-01-20 DIAGNOSIS — R2689 Other abnormalities of gait and mobility: Secondary | ICD-10-CM | POA: Insufficient documentation

## 2021-01-20 NOTE — Patient Instructions (Signed)
Access Code: [email or web address] URL: [email or web address] Date: 01/20/2021 Prepared by: Estill Bamberg April Thurnell Garbe  Exercises Small Range Straight Leg Raise - 1 x daily - 7 x weekly - 3 sets - 10 reps Hooklying Clamshell with Resistance - 1 x daily - 7 x weekly - 3 sets - 10 reps Supine Bridge - 1 x daily - 5 x weekly - 2 sets - 10 reps Sit to Stand with Armchair - 2 x daily - 5 x weekly - 1 sets - 10 reps Seated Heel Toe Raises - 2 x daily - 5 x weekly - 2 sets - 10 reps Seated Knee Extension with Resistance - 1 x daily - 7 x weekly - 3 sets - 10 reps Long Sitting Ankle Plantar Flexion with Resistance - 1 x daily - 7 x weekly - 2 sets - 10 reps

## 2021-01-20 NOTE — Therapy (Signed)
[facility] [address] [place], [place], [address] Phone: [phone]   Fax:  [phone]  Physical Therapy Treatment  Patient Details  Name: Mary Jackson MRN: [id number] Date of Birth: October 16, 1980 Referring Provider (PT): Dr. Georgina Snell   Encounter Date: 01/20/2021   PT End of Session - 01/20/21 1530     Visit Number 3    Number of Visits 7    Date for PT Re-Evaluation 03/31/21    Authorization Type [employer]    PT Start Time 1440    PT Stop Time 1525    PT Time Calculation (min) 45 min    Equipment Utilized During Treatment Gait belt    Activity Tolerance Patient tolerated treatment well    Behavior During Therapy WFL for tasks assessed/performed             Past Medical History:  Diagnosis Date   Depression with anxiety    MS (multiple sclerosis) ([place])     No past surgical history on file.  There were no vitals filed for this visit.   Subjective Assessment - 01/20/21 1445     Subjective No changes since she was last here. Pt reports L AFO hurts her foot/pinky toe.    Patient is accompained by: Family member   sister, Mary Jackson   Pertinent History MS, depression    Limitations Standing;Walking    Patient Stated Goals wants to move around better, does not want to use a rollator.    Currently in Pain? No/denies                               [facility] Adult PT Treatment/Exercise - 01/20/21 0001       Ambulation/Gait   Ambulation/Gait Assistance 5: Supervision    Ambulation Distance (Feet) 230 Feet    Assistive device Rollator    Gait Pattern Step-through pattern;Decreased stance time - left;Decreased hip/knee flexion - right;Decreased hip/knee flexion - left;Decreased dorsiflexion - right;Decreased dorsiflexion - left;Right circumduction;Left foot flat;Right foot flat;Lateral hip instability;Wide base of support;Trunk flexed;Poor foot clearance - left;Poor foot clearance - right     Gait Comments Cues for heel strike with improved foot clearance and step length.      Knee/Hip Exercises: Stretches   Press photographer Right;Left;30 seconds    Gastroc Stretch Limitations using wedge      Knee/Hip Exercises: Standing   Heel Raises 10 reps   unable to get complete heel clearance     Knee/Hip Exercises: Seated   Long Arc Quad Strengthening;AROM;Both;2 sets;10 reps    Long Arc Quad Limitations red tband    Other Seated Knee/Hip Exercises seated heel <> toe raises 2x 10 reps   with rockerboard   Other Seated Knee/Hip Exercises ankle inv/ev with wash rag 2x10; PF 2x10 red tband      Knee/Hip Exercises: Supine   Bridges Strengthening;Both;2 sets;10 reps    Straight Leg Raises Strengthening;10 reps;Both;2 sets    Other Supine Knee/Hip Exercises 2 x 10 reps alternating marching red tband    Other Supine Knee/Hip Exercises hooklying clamshell red tband 2x10                      PT Short Term Goals - 12/31/20 1443       PT SHORT TERM GOAL #1   Title Patient will be independent with initial HEP in order to build upon functional gains made in therapy. ALL  STGS DUE 01/28/21    Time 4    Period Weeks    Status New    Target Date 01/28/21      PT SHORT TERM GOAL #2   Title Patient will decrease TUG time to 23 seconds or less in order to demo decreased fall risk.    Baseline 25.81 seconds with rollator    Time 4    Period Weeks    Status New      PT SHORT TERM GOAL #3   Title Patient will ambulate at least 51' with rollator and supervision in order to improve household and community mobility.    Time 4    Period Weeks    Status New               PT Long Term Goals - 12/31/20 1444       PT LONG TERM GOAL #1   Title Patient will improve gait speed with rollator to at least 1.5 ft/sec in order to demo decreased fall risk. ALL LTGS DUE 02/25/21    Baseline 1.16 ft/sec    Time 8   written 12 weeks out due to scheduling   Period Weeks    Status New     Target Date 02/25/21      PT LONG TERM GOAL #2   Title Patient will decrease TUG time to 20 seconds or less in order to demo decreased fall risk.    Baseline 25.81 seconds with rollator    Time 8    Period Weeks    Status New      PT LONG TERM GOAL #3   Title Patient will decrease 5x sit <> stand score to 20 seconds or less from standard height chair with UE support in order to demo improved strength and decr fall risk.    Baseline 24.94 seconds    Time 8    Period Weeks    Status New                   Plan - 01/20/21 1530     Clinical Impression Statement Trialed L AFO; however, pt reports increased pain with use on bottom of L pinky toe. Without AFO, pt still able to perform heel strike. Session focused on progressing her strengthening exercises with increased resistance. Worked on improving Aeronautical engineer. With increased gait speed pt demos increased R LE PF tone/decreased R heel strike & eversion. Pt requires seated rest break during amb due to UE fatigue.    Personal Factors and Comorbidities Age;Comorbidity 2;Finances;Past/Current Experience;Time since onset of injury/illness/exacerbation    Comorbidities MS, depression    Examination-Activity Limitations Locomotion Level;Stand;Squat;Transfers    Examination-Participation Restrictions Community Activity;Cleaning;Laundry    Stability/Clinical Decision Making Evolving/Moderate complexity    Rehab Potential Good    PT Frequency Biweekly    PT Duration 12 weeks    PT Treatment/Interventions ADLs/Self Care Home Management;Aquatic Therapy;DME Instruction;Gait training;Stair training;Therapeutic activities;Functional mobility training;Therapeutic exercise;Balance training;Neuromuscular re-education;Orthotic Fit/Training;Patient/family education;Passive range of motion;Energy conservation;Vestibular;Manual techniques    PT Next Visit Plan R AFO referral? BLE strengthening (esp proximal hip muscles), ROM and balance.    PT  Home Exercise Plan Access Code: [email or web address]    Consulted and Agree with Plan of Care Patient;Family member/caregiver    Family Member Consulted pt's sister             Patient will benefit from skilled therapeutic intervention in order to improve the following deficits and impairments:  Decreased activity tolerance, Abnormal  gait, Decreased balance, Decreased coordination, Decreased endurance, Decreased mobility, Decreased range of motion, Difficulty walking, Decreased strength, Pain, Impaired sensation  Visit Diagnosis: Difficulty in walking, not elsewhere classified  Muscle weakness (generalized)  Other symptoms and signs involving the nervous system  Other abnormalities of gait and mobility  Right knee pain, unspecified chronicity     Problem List Patient Active Problem List   Diagnosis Date Noted   Right foot drop 01/08/2021   Deficiency anemia 12/03/2020   Visit for screening mammogram 12/03/2020   Encounter for general adult medical examination with abnormal findings 12/03/2020   Cervical cancer screening 12/03/2020   Dyslipidemia, goal LDL below 160 12/03/2020   Need for vaccination 12/03/2020   Neck pain, chronic 12/03/2020   Chronic pain of right knee 12/03/2020   Secondary progressive multiple sclerosis ([place]) 04/03/2019   Vitamin D deficiency 04/03/2019   Depression 04/03/2019   High risk medication use 04/03/2019   Delayed sleep phase syndrome 04/03/2019    [facility] April Ma L Lan Mcneill PT, DPT 01/20/2021, 3:41 PM  [facility] [address] [place], [place], [address] Phone: [phone]   Fax:  [phone]  Name: Mary Jackson MRN: [id number] Date of Birth: 02-Aug-1980

## 2021-01-24 NOTE — Progress Notes (Signed)
   I, Wendy Poet, LAT, ATC, am serving as scribe for Dr. Lynne Leader.  Mary Jackson is a 40 y.o. female who presents to [facility] today for f/u of chronic R knee pain.  She was last seen by Dr. Georgina Snell on 12/16/20 and had a R knee injection.  She was also referred to PT of which she has completed 3 sessions.  She is also currently being treated for MS and walks w/ a RW.  Since her last visit, pt reports her R knee is all better and is not bothering her. Pt notes PT has been helpful. Pt has been working on [employer] as well.  She has a little pain in the left anterior ankle in the last few days.  She has been seeing physical therapy for both issues and overall is pretty happy with how things are going.  Diagnostic imaging: R knee XR- 12/16/20  Pertinent review of systems: No fevers or chills  Relevant historical information: Multiple sclerosis   Exam:  BP 100/66 (BP Location: Right Arm, Patient Position: Sitting, Cuff Size: Normal)   Pulse 65   Wt 178 lb (80.7 kg)   SpO2 90%   BMI 27.88 kg/m  General: Well Developed, well nourished, and in no acute distress.   MSK: Right knee normal motion. Left ankle normal-appearing normal motion normal strength. Stable ligamentous exam. Mildly tender palpation anterior ankle.    Lab and Radiology Results EXAM: RIGHT KNEE 3 VIEWS   COMPARISON:  No recent prior.   FINDINGS: No acute bony or joint abnormality identified. No evidence of fracture or dislocation.   IMPRESSION: No acute abnormality.     Electronically Signed   By: Marcello Moores  Register   On: 12/17/2020 10:15   I, Lynne Leader, personally (independently) visualized and performed the interpretation of the images attached in this note.    Assessment and Plan: 40 y.o. female with right knee pain due to quad weakness and mild DJD.  Did well with the injection and quad strengthening exercises.  Plan to continue PT.  Recheck as needed for this issue.  Happy  to reauthorize more PT in the future.  Left ankle pain unclear etiology.  Doing well with strengthening with PT.  Continue PT recheck as needed.    Discussed warning signs or symptoms. Please see discharge instructions. Patient expresses understanding.   The above documentation has been reviewed and is accurate and complete Lynne Leader, M.D.

## 2021-01-27 ENCOUNTER — Other Ambulatory Visit: Payer: Self-pay

## 2021-01-27 ENCOUNTER — Other Ambulatory Visit: Payer: Self-pay | Admitting: Neurology

## 2021-01-27 ENCOUNTER — Ambulatory Visit (INDEPENDENT_AMBULATORY_CARE_PROVIDER_SITE_OTHER): Payer: Medicare HMO | Admitting: Family Medicine

## 2021-01-27 VITALS — BP 100/66 | HR 65 | Wt 178.0 lb

## 2021-01-27 DIAGNOSIS — G35 Multiple sclerosis: Secondary | ICD-10-CM | POA: Diagnosis not present

## 2021-01-27 DIAGNOSIS — M25572 Pain in left ankle and joints of left foot: Secondary | ICD-10-CM | POA: Diagnosis not present

## 2021-01-27 DIAGNOSIS — M25561 Pain in right knee: Secondary | ICD-10-CM

## 2021-01-27 DIAGNOSIS — G8929 Other chronic pain: Secondary | ICD-10-CM

## 2021-01-27 NOTE — Patient Instructions (Signed)
Thank you for coming in today.   Continue the PT.   I recommend you obtained a compression sleeve to help with your joint problems. There are many options on the market however I recommend obtaining a full ankle Body Helix compression sleeve.  You can find information (including how to appropriate measure yourself for sizing) can be found at www.Body http://www.lambert.com/.  Many of these products are health savings account (HSA) eligible.   You can use the compression sleeve at any time throughout the day but is most important to use while being active as well as for 2 hours post-activity.   It is appropriate to ice following activity with the compression sleeve in place.   Please use Voltaren gel (Generic Diclofenac Gel) up to 4x daily for pain as needed.  This is available over-the-counter as both the name brand Voltaren gel and the generic diclofenac gel.   Recheck as needed.

## 2021-02-03 ENCOUNTER — Ambulatory Visit: Payer: Medicare HMO

## 2021-02-03 ENCOUNTER — Other Ambulatory Visit: Payer: Self-pay

## 2021-02-03 DIAGNOSIS — R262 Difficulty in walking, not elsewhere classified: Secondary | ICD-10-CM

## 2021-02-03 DIAGNOSIS — R29818 Other symptoms and signs involving the nervous system: Secondary | ICD-10-CM | POA: Diagnosis not present

## 2021-02-03 DIAGNOSIS — M25561 Pain in right knee: Secondary | ICD-10-CM

## 2021-02-03 DIAGNOSIS — M6281 Muscle weakness (generalized): Secondary | ICD-10-CM

## 2021-02-03 DIAGNOSIS — R2689 Other abnormalities of gait and mobility: Secondary | ICD-10-CM | POA: Diagnosis not present

## 2021-02-03 NOTE — Therapy (Signed)
[facility] [address] [place], [place], [address] Phone: [phone]   Fax:  [phone]  Physical Therapy Treatment  Patient Details  Name: Mary Jackson MRN: [id number] Date of Birth: 1980-11-02 Referring Provider (PT): Dr. Georgina Snell   Encounter Date: 02/03/2021   PT End of Session - 02/03/21 1108     Visit Number 4    Number of Visits 7    Date for PT Re-Evaluation 03/31/21    Authorization Type [employer]    PT Start Time 1100    PT Stop Time 1145    PT Time Calculation (min) 45 min    Equipment Utilized During Treatment Gait belt    Activity Tolerance Patient tolerated treatment well    Behavior During Therapy WFL for tasks assessed/performed             Past Medical History:  Diagnosis Date   Depression with anxiety    MS (multiple sclerosis) ([place])     History reviewed. No pertinent surgical history.  There were no vitals filed for this visit.   Subjective Assessment - 02/03/21 1107     Subjective R knee pain improved but having some L knee as well.  Feels she has weak ankles.    Patient is accompained by: Family member   sister, Mary Jackson   Pertinent History MS, depression    Limitations Standing;Walking    Patient Stated Goals wants to move around better, does not want to use a rollator.                               Mary Jackson Adult PT Treatment/Exercise - 02/03/21 0001       Ambulation/Gait   Ambulation/Gait Assistance 5: Supervision    Ambulation Distance (Feet) 100 Feet    Assistive device Rollator    Gait Pattern Right steppage;Left steppage;Step-through pattern      Knee/Hip Exercises: Standing   Heel Raises 10 reps      Knee/Hip Exercises: Seated   Long Arc Quad Strengthening;Both;2 sets;10 reps;Weights    Long Arc Quad Weight 2 lbs.    Long [employer] Limitations with lat press    Heel Slides Strengthening;Both;2 sets;10 reps;Limitations    Heel Slides  Limitations 2# with towel    Ball Squeeze LAQ w/ball squeeze 2x10, 2# ankle weights    Marching Both;2 sets      Knee/Hip Exercises: Supine   Bridges Strengthening;Both;2 sets;10 reps    Other Supine Knee/Hip Exercises 2 x 10 reps alternating marching 1#    Other Supine Knee/Hip Exercises hip fallouts      Knee/Hip Exercises: Sidelying   Clams 2x10                 Balance Exercises - 02/03/21 0001       Balance Exercises: Standing   Standing Eyes Opened Wide (BOA);Foam/compliant surface;1 rep;Limitations    Standing Eyes Opened Limitations 2 min hold                 PT Short Term Goals - 12/31/20 1443       PT SHORT TERM GOAL #1   Title Patient will be independent with initial HEP in order to build upon functional gains made in therapy. ALL STGS DUE 01/28/21    Time 4    Period Weeks    Status New    Target Date 01/28/21      PT SHORT TERM  GOAL #2   Title Patient will decrease TUG time to 23 seconds or less in order to demo decreased fall risk.    Baseline 25.81 seconds with rollator    Time 4    Period Weeks    Status New      PT SHORT TERM GOAL #3   Title Patient will ambulate at least 26' with rollator and supervision in order to improve household and community mobility.    Time 4    Period Weeks    Status New               PT Long Term Goals - 12/31/20 1444       PT LONG TERM GOAL #1   Title Patient will improve gait speed with rollator to at least 1.5 ft/sec in order to demo decreased fall risk. ALL LTGS DUE 02/25/21    Baseline 1.16 ft/sec    Time 8   written 12 weeks out due to scheduling   Period Weeks    Status New    Target Date 02/25/21      PT LONG TERM GOAL #2   Title Patient will decrease TUG time to 20 seconds or less in order to demo decreased fall risk.    Baseline 25.81 seconds with rollator    Time 8    Period Weeks    Status New      PT LONG TERM GOAL #3   Title Patient will decrease 5x sit <> stand score to 20  seconds or less from standard height chair with UE support in order to demo improved strength and decr fall risk.    Baseline 24.94 seconds    Time 8    Period Weeks    Status New                   Plan - 02/03/21 1146     Clinical Impression Statement Todays session focused on knee and hip strengthening as well as balance exercises emphasizing control of LEs.  Added weight to activities to strengthen LEs.  Attempted static standing on [employer] requiring single UE support to maintain position.  Unable to perform heel raises due to weakness    Personal Factors and Comorbidities Age;Comorbidity 2;Finances;Past/Current Experience;Time since onset of injury/illness/exacerbation    Comorbidities MS, depression    Examination-Activity Limitations Locomotion Level;Stand;Squat;Transfers    Examination-Participation Restrictions Community Activity;Cleaning;Laundry    Stability/Clinical Decision Making Evolving/Moderate complexity    Rehab Potential Good    PT Frequency Biweekly    PT Duration 12 weeks    PT Treatment/Interventions ADLs/Self Care Home Management;Aquatic Therapy;DME Instruction;Gait training;Stair training;Therapeutic activities;Functional mobility training;Therapeutic exercise;Balance training;Neuromuscular re-education;Orthotic Fit/Training;Patient/family education;Passive range of motion;Energy conservation;Vestibular;Manual techniques    PT Next Visit Plan Continue LE sterngthening, balance tasks and ankle proprioceptive work    PT Home Exercise Plan Access Code: [email or web address]    Consulted and Agree with Plan of Care Patient;Family member/caregiver    Family Member Consulted pt's sister             Patient will benefit from skilled therapeutic intervention in order to improve the following deficits and impairments:  Decreased activity tolerance, Abnormal gait, Decreased balance, Decreased coordination, Decreased endurance, Decreased mobility, Decreased range of motion,  Difficulty walking, Decreased strength, Pain, Impaired sensation  Visit Diagnosis: Difficulty in walking, not elsewhere classified  Muscle weakness (generalized)  Right knee pain, unspecified chronicity     Problem List Patient Active Problem List   Diagnosis Date Noted  Right foot drop 01/08/2021   Deficiency anemia 12/03/2020   Visit for screening mammogram 12/03/2020   Encounter for general adult medical examination with abnormal findings 12/03/2020   Cervical cancer screening 12/03/2020   Dyslipidemia, goal LDL below 160 12/03/2020   Need for vaccination 12/03/2020   Neck pain, chronic 12/03/2020   Chronic pain of right knee 12/03/2020   Secondary progressive multiple sclerosis ([place]) 04/03/2019   Vitamin D deficiency 04/03/2019   Depression 04/03/2019   High risk medication use 04/03/2019   Delayed sleep phase syndrome 04/03/2019    Mary Jackson 02/03/2021, 11:59 AM  [facility] [address] [place], [place], [address] Phone: [phone]   Fax:  [phone]  Name: Mary Jackson MRN: [id number] Date of Birth: Sep 22, 1980

## 2021-02-10 DIAGNOSIS — G35 Multiple sclerosis: Secondary | ICD-10-CM | POA: Diagnosis not present

## 2021-02-11 ENCOUNTER — Other Ambulatory Visit: Payer: Self-pay

## 2021-02-11 ENCOUNTER — Ambulatory Visit
Admission: RE | Admit: 2021-02-11 | Discharge: 2021-02-11 | Disposition: A | Discharge status: Home / Self Care | Payer: Medicare HMO | Source: Ambulatory Visit | Attending: Internal Medicine | Admitting: Internal Medicine

## 2021-02-11 DIAGNOSIS — Z1231 Encounter for screening mammogram for malignant neoplasm of breast: Secondary | ICD-10-CM | POA: Diagnosis not present

## 2021-02-17 ENCOUNTER — Other Ambulatory Visit: Payer: Self-pay

## 2021-02-17 ENCOUNTER — Ambulatory Visit: Payer: Medicare HMO | Attending: Family Medicine

## 2021-02-17 ENCOUNTER — Other Ambulatory Visit: Payer: Self-pay | Admitting: Internal Medicine

## 2021-02-17 DIAGNOSIS — M6281 Muscle weakness (generalized): Secondary | ICD-10-CM | POA: Diagnosis not present

## 2021-02-17 DIAGNOSIS — R29818 Other symptoms and signs involving the nervous system: Secondary | ICD-10-CM | POA: Diagnosis not present

## 2021-02-17 DIAGNOSIS — R928 Other abnormal and inconclusive findings on diagnostic imaging of breast: Secondary | ICD-10-CM

## 2021-02-17 DIAGNOSIS — R262 Difficulty in walking, not elsewhere classified: Secondary | ICD-10-CM | POA: Diagnosis not present

## 2021-02-17 NOTE — Therapy (Signed)
[facility] [address] [place], [place], [address] Phone: [phone]   Fax:  [phone]  Physical Therapy Treatment  Patient Details  Name: Mary Jackson MRN: [id number] Date of Birth: 11-Sep-1980 Referring Provider (PT): Dr. Georgina Snell   Encounter Date: 02/17/2021   PT End of Session - 02/17/21 1152     Visit Number 5    Number of Visits 7    Date for PT Re-Evaluation 03/31/21    Authorization Type [employer]    PT Start Time 1100    PT Stop Time 1145    PT Time Calculation (min) 45 min    Equipment Utilized During Treatment Gait belt    Activity Tolerance Patient tolerated treatment well    Behavior During Therapy WFL for tasks assessed/performed             Past Medical History:  Diagnosis Date   Depression with anxiety    MS (multiple sclerosis) ([place])     History reviewed. No pertinent surgical history.  There were no vitals filed for this visit.   Subjective Assessment - 02/17/21 1151     Subjective Reports her walking has improved, is able to take bigger steps and walk faster.  No knee pain today    Patient is accompained by: Family member   sister, Rollene Fare   Pertinent History MS, depression    Limitations Standing;Walking    Patient Stated Goals wants to move around better, does not want to use a rollator.                               [facility] Adult PT Treatment/Exercise - 02/17/21 0001       Transfers   Transfers Sit to Stand    Sit to Stand 5: Supervision;With upper extremity assist;From chair/3-in-1    Number of Reps 10 reps      Ambulation/Gait   Ambulation/Gait Assistance 5: Supervision    Ambulation Distance (Feet) 230 Feet    Assistive device Rollator    Gait Pattern Right steppage;Left steppage;Step-through pattern    Gait Comments Cues for heel strike with improved foot clearance and step length.      Knee/Hip Exercises: Seated   Heel Slides  Strengthening;Both;2 sets;10 reps;Limitations    Heel Slides Limitations 2# with towel    Marching Both;2 sets    Marching Weights 2 lbs.      Knee/Hip Exercises: Supine   Bridges Strengthening;Both;2 sets;10 reps    Other Supine Knee/Hip Exercises 2 x10 reps alternating marching 2#    Other Supine Knee/Hip Exercises hip fallouts 2x10 red band      Knee/Hip Exercises: Sidelying   Clams 2x10 2#                      PT Short Term Goals - 02/17/21 1211       PT SHORT TERM GOAL #1   Title Patient will be independent with initial HEP in order to build upon functional gains made in therapy. ALL STGS DUE 01/28/21    Baseline patient I and compliant with HEP    Time 4    Period Weeks    Status Achieved    Target Date 01/28/21      PT SHORT TERM GOAL #2   Title Patient will decrease TUG time to 23 seconds or less in order to demo decreased fall risk.    Baseline 25.81 seconds  with rollator; 02/17/21 TUG time 21.9s    Time 4    Period Weeks    Status Achieved      PT SHORT TERM GOAL #3   Title Patient will ambulate at least 63' with rollator and supervision in order to improve household and community mobility.    Baseline 02/17/21 Able to ambulate 247ft with RW under close S    Time 4    Period Weeks    Status Achieved               PT Long Term Goals - 12/31/20 1444       PT LONG TERM GOAL #1   Title Patient will improve gait speed with rollator to at least 1.5 ft/sec in order to demo decreased fall risk. ALL LTGS DUE 02/25/21    Baseline 1.16 ft/sec    Time 8   written 12 weeks out due to scheduling   Period Weeks    Status New    Target Date 02/25/21      PT LONG TERM GOAL #2   Title Patient will decrease TUG time to 20 seconds or less in order to demo decreased fall risk.    Baseline 25.81 seconds with rollator    Time 8    Period Weeks    Status New      PT LONG TERM GOAL #3   Title Patient will decrease 5x sit <> stand score to 20 seconds or less  from standard height chair with UE support in order to demo improved strength and decr fall risk.    Baseline 24.94 seconds    Time 8    Period Weeks    Status New                   Plan - 02/17/21 1224     Clinical Impression Statement Todays session focussd on continued strengthening of LEs and assessment of STGs.  TUG and ambulation goals met.  Patient compliant with HEP    Personal Factors and Comorbidities Age;Comorbidity 2;Finances;Past/Current Experience;Time since onset of injury/illness/exacerbation    Comorbidities MS, depression    Examination-Activity Limitations Locomotion Level;Stand;Squat;Transfers    Examination-Participation Restrictions Community Activity;Cleaning;Laundry    Stability/Clinical Decision Making Evolving/Moderate complexity    Rehab Potential Good    PT Frequency Biweekly    PT Duration 12 weeks    PT Treatment/Interventions ADLs/Self Care Home Management;Aquatic Therapy;DME Instruction;Gait training;Stair training;Therapeutic activities;Functional mobility training;Therapeutic exercise;Balance training;Neuromuscular re-education;Orthotic Fit/Training;Patient/family education;Passive range of motion;Energy conservation;Vestibular;Manual techniques    PT Next Visit Plan LE sterngthening, balance tasks and ankle proprioceptive work, assess progress towards LTGs    PT Home Exercise Plan Access Code: [email or web address]    Consulted and Agree with Plan of Care Patient;Family member/caregiver    Family Member Consulted pt's sister             Patient will benefit from skilled therapeutic intervention in order to improve the following deficits and impairments:  Decreased activity tolerance, Abnormal gait, Decreased balance, Decreased coordination, Decreased endurance, Decreased mobility, Decreased range of motion, Difficulty walking, Decreased strength, Pain, Impaired sensation  Visit Diagnosis: Difficulty in walking, not elsewhere classified  Muscle  weakness (generalized)  Other symptoms and signs involving the nervous system     Problem List Patient Active Problem List   Diagnosis Date Noted   Right foot drop 01/08/2021   Deficiency anemia 12/03/2020   Visit for screening mammogram 12/03/2020   Encounter for general adult medical examination with abnormal findings  12/03/2020   Cervical cancer screening 12/03/2020   Dyslipidemia, goal LDL below 160 12/03/2020   Need for vaccination 12/03/2020   Neck pain, chronic 12/03/2020   Chronic pain of right knee 12/03/2020   Secondary progressive multiple sclerosis ([place]) 04/03/2019   Vitamin D deficiency 04/03/2019   Depression 04/03/2019   High risk medication use 04/03/2019   Delayed sleep phase syndrome 04/03/2019    Lanice Shirts 02/17/2021, 12:30 PM  [facility] [address] [place], [place], [address] Phone: [phone]   Fax:  [phone]  Name: Baelynn Schmuhl MRN: [id number] Date of Birth: 1980-11-04

## 2021-03-03 ENCOUNTER — Ambulatory Visit: Payer: Medicare HMO | Admitting: Physical Therapy

## 2021-03-03 ENCOUNTER — Other Ambulatory Visit: Payer: Self-pay

## 2021-03-03 ENCOUNTER — Encounter: Payer: Self-pay | Admitting: Physical Therapy

## 2021-03-03 DIAGNOSIS — R29818 Other symptoms and signs involving the nervous system: Secondary | ICD-10-CM | POA: Diagnosis not present

## 2021-03-03 DIAGNOSIS — M6281 Muscle weakness (generalized): Secondary | ICD-10-CM | POA: Diagnosis not present

## 2021-03-03 DIAGNOSIS — R262 Difficulty in walking, not elsewhere classified: Secondary | ICD-10-CM | POA: Diagnosis not present

## 2021-03-03 NOTE — Therapy (Signed)
[facility] [address] [place], [place], [address] Phone: [phone]   Fax:  [phone]  Physical Therapy Treatment  Patient Details  Name: Mary Jackson MRN: [id number] Date of Birth: 1980-11-19 Referring Provider (PT): Dr. Georgina Snell   Encounter Date: 03/03/2021   PT End of Session - 03/03/21 1211     Visit Number 6    Number of Visits 7    Date for PT Re-Evaluation 03/31/21    Authorization Type [employer]    PT Start Time 1105    PT Stop Time 1146   5 minutes non billable due to pt using restroom   PT Time Calculation (min) 41 min    Equipment Utilized During Treatment Gait belt    Activity Tolerance Patient tolerated treatment well    Behavior During Therapy WFL for tasks assessed/performed             Past Medical History:  Diagnosis Date   Depression with anxiety    MS (multiple sclerosis) ([place])     History reviewed. No pertinent surgical history.  There were no vitals filed for this visit.   Subjective Assessment - 03/03/21 1108     Subjective Reports she is doing better- knees aren't bothering her and walking has improved.    Patient is accompained by: Family member   sister, Rollene Fare   Pertinent History MS, depression    Limitations Standing;Walking    Patient Stated Goals wants to move around better, does not want to use a rollator.                [facility] PT Assessment - 03/03/21 1121       Timed Up and Go Test   Normal TUG (seconds) 19.03   with rollator                          [facility] Adult PT Treatment/Exercise - 03/03/21 1121       Transfers   Transfers Sit to Stand    Sit to Stand 5: Supervision;With upper extremity assist;From chair/3-in-1    Five time sit to stand comments  21.47 seconds using BUE support from standard height chair    Comments x10 reps from elevated mat table with no UE support      Ambulation/Gait   Ambulation/Gait Yes     Ambulation/Gait Assistance 5: Supervision    Ambulation/Gait Assistance Details throughout session between activaties    Assistive device Rollator    Gait Pattern Step-through pattern;Right foot flat;Left foot flat;Wide base of support;Abducted- right;Right circumduction    Ambulation Surface Level;Indoor    Gait velocity 20.22 seconds =  1.62 ft/sec      Therapeutic Activites    Therapeutic Activities Other Therapeutic Activities    Other Therapeutic Activities gave pt information about obtaining new R AFO (pt was given prescription before and has not followed up with [facility]), discussed need for a face to face visit for insurance purposes (will see her neurologist next month), phone number/address for pt to make appt and for pt to bring order with her      Knee/Hip Exercises: Seated   Long Arc Quad Strengthening;Both;2 sets;10 reps;Weights    Long Arc Quad Weight 2 lbs.    Long [employer] Limitations cues for full ROM and isometric hold    Publishing copy Limitations cues for incr ROM    Marching Weights 2 lbs.    Abd/Adduction  Limitations 2 x 10 reps hip ADD ball squeezes                    PT Education - 03/03/21 1208     Education Details see TA, progress towards goals    Person(s) Educated Patient    Methods Explanation    Comprehension Verbalized understanding              PT Short Term Goals - 02/17/21 1211       PT SHORT TERM GOAL #1   Title Patient will be independent with initial HEP in order to build upon functional gains made in therapy. ALL STGS DUE 01/28/21    Baseline patient I and compliant with HEP    Time 4    Period Weeks    Status Achieved    Target Date 01/28/21      PT SHORT TERM GOAL #2   Title Patient will decrease TUG time to 23 seconds or less in order to demo decreased fall risk.    Baseline 25.81 seconds with rollator; 02/17/21 TUG time 21.9s    Time 4    Period Weeks    Status Achieved      PT SHORT TERM GOAL #3    Title Patient will ambulate at least 56' with rollator and supervision in order to improve household and community mobility.    Baseline 02/17/21 Able to ambulate 268ft with RW under close S    Time 4    Period Weeks    Status Achieved               PT Long Term Goals - 03/03/21 1120       PT LONG TERM GOAL #1   Title Patient will improve gait speed with rollator to at least 1.5 ft/sec in order to demo decreased fall risk. ALL LTGS DUE 02/25/21    Baseline 1.16 ft/sec; 1.62 ft/sec on 03/03/21    Time 8   written 12 weeks out due to scheduling   Period Weeks    Status Achieved      PT LONG TERM GOAL #2   Title Patient will decrease TUG time to 20 seconds or less in order to demo decreased fall risk.    Baseline 25.81 seconds with rollator; 19.03 seconds on 03/03/21    Time 8    Period Weeks    Status Achieved      PT LONG TERM GOAL #3   Title Patient will decrease 5x sit <> stand score to 20 seconds or less from standard height chair with UE support in order to demo improved strength and decr fall risk.    Baseline 24.94 seconds; 21.47 seconds using BUE support from standard height chair on 03/03/21    Time 8    Period Weeks    Status Not Met              Updated LTGs:   PT Long Term Goals - 03/03/21 1226       PT LONG TERM GOAL #1   Title Patient will improve gait speed with rollator to at least 1.85 ft/sec in order to demo decreased fall risk. ALL LTGS DUE 03/31/21    Baseline 1.16 ft/sec; 1.62 ft/sec on 03/03/21    Time 12    Period Weeks    Status Revised    Target Date 03/31/21      PT LONG TERM GOAL #2   Title pt will be independent with final  HEP in order to build upon functional gains made in therapy.    Time 4    Period Weeks    Status New      PT LONG TERM GOAL #3   Title Patient will decrease 5x sit <> stand score to 20 seconds or less from standard height chair with UE support in order to demo improved strength and decr fall risk.    Baseline  24.94 seconds; 21.47 seconds using BUE support from standard height chair on 03/03/21    Time 4    Period Weeks    Status On-going                 Plan - 03/03/21 1222     Clinical Impression Statement Began to check pt's LTGs today. Pt achieved LTG #1 and #2. Pt improved gait speed with rollator to 1.62 ft/sec (previously 1.16 ft/sec). Improved TUG with rollator to 19.03 seconds (previously 25.81 seconds). Did not meet LTG #3 in regards to sit <> stands. LTGs updated/revised as appropriate.  Provided pt information on how to obtain new R AFO from [facility] and need for face to face visit.    Personal Factors and Comorbidities Age;Comorbidity 2;Finances;Past/Current Experience;Time since onset of injury/illness/exacerbation    Comorbidities MS, depression    Examination-Activity Limitations Locomotion Level;Stand;Squat;Transfers    Examination-Participation Restrictions Community Activity;Cleaning;Laundry    Stability/Clinical Decision Making Evolving/Moderate complexity    Rehab Potential Good    PT Frequency Biweekly    PT Duration 12 weeks    PT Treatment/Interventions ADLs/Self Care Home Management;Aquatic Therapy;DME Instruction;Gait training;Stair training;Therapeutic activities;Functional mobility training;Therapeutic exercise;Balance training;Neuromuscular re-education;Orthotic Fit/Training;Patient/family education;Passive range of motion;Energy conservation;Vestibular;Manual techniques    PT Next Visit Plan any update on new AFO? LE strengthening, balance tasks and ankle proprioceptive work    PT Home Exercise Plan Access Code: [email or web address]    Consulted and Agree with Plan of Care Patient;Family member/caregiver    Family Member Consulted pt's sister             Patient will benefit from skilled therapeutic intervention in order to improve the following deficits and impairments:  Decreased activity tolerance, Abnormal gait, Decreased balance, Decreased coordination, Decreased  endurance, Decreased mobility, Decreased range of motion, Difficulty walking, Decreased strength, Pain, Impaired sensation  Visit Diagnosis: Difficulty in walking, not elsewhere classified  Muscle weakness (generalized)  Other symptoms and signs involving the nervous system     Problem List Patient Active Problem List   Diagnosis Date Noted   Right foot drop 01/08/2021   Deficiency anemia 12/03/2020   Visit for screening mammogram 12/03/2020   Encounter for general adult medical examination with abnormal findings 12/03/2020   Cervical cancer screening 12/03/2020   Dyslipidemia, goal LDL below 160 12/03/2020   Need for vaccination 12/03/2020   Neck pain, chronic 12/03/2020   Chronic pain of right knee 12/03/2020   Secondary progressive multiple sclerosis ([place]) 04/03/2019   Vitamin D deficiency 04/03/2019   Depression 04/03/2019   High risk medication use 04/03/2019   Delayed sleep phase syndrome 04/03/2019    Arliss Journey , PT, DPT 03/03/2021, 12:26 PM  [facility] [address] [place], [place], [address] Phone: [phone]   Fax:  [phone]  Name: Mary Jackson MRN: [id number] Date of Birth: 01/09/1981

## 2021-03-13 HISTORY — PX: BREAST BIOPSY: SHX20

## 2021-03-14 ENCOUNTER — Other Ambulatory Visit: Payer: Self-pay | Admitting: Neurology

## 2021-03-18 ENCOUNTER — Ambulatory Visit: Payer: Medicare HMO | Admitting: Physical Therapy

## 2021-03-24 ENCOUNTER — Telehealth: Payer: Self-pay | Admitting: *Deleted

## 2021-03-24 ENCOUNTER — Other Ambulatory Visit: Payer: Self-pay | Admitting: *Deleted

## 2021-03-24 DIAGNOSIS — Z79899 Other long term (current) drug therapy: Secondary | ICD-10-CM | POA: Diagnosis not present

## 2021-03-24 DIAGNOSIS — G35 Multiple sclerosis: Secondary | ICD-10-CM | POA: Diagnosis not present

## 2021-03-24 NOTE — Telephone Encounter (Signed)
Placed JCV antibody specimen out in the [facility] box for pick up.

## 2021-03-24 NOTE — Progress Notes (Unsigned)
cbc

## 2021-03-25 ENCOUNTER — Ambulatory Visit
Admission: RE | Admit: 2021-03-25 | Discharge: 2021-03-25 | Disposition: A | Discharge status: Home / Self Care | Payer: Medicare HMO | Source: Ambulatory Visit | Attending: Internal Medicine | Admitting: Internal Medicine

## 2021-03-25 ENCOUNTER — Other Ambulatory Visit: Payer: Self-pay | Admitting: Internal Medicine

## 2021-03-25 ENCOUNTER — Encounter: Payer: Self-pay | Admitting: Obstetrics & Gynecology

## 2021-03-25 ENCOUNTER — Ambulatory Visit (INDEPENDENT_AMBULATORY_CARE_PROVIDER_SITE_OTHER): Payer: Medicare HMO | Admitting: Obstetrics & Gynecology

## 2021-03-25 ENCOUNTER — Other Ambulatory Visit: Payer: Self-pay

## 2021-03-25 ENCOUNTER — Other Ambulatory Visit (HOSPITAL_COMMUNITY)
Admission: RE | Admit: 2021-03-25 | Discharge: 2021-03-25 | Disposition: A | Discharge status: Home / Self Care | Payer: Medicare HMO | Source: Ambulatory Visit | Attending: Obstetrics & Gynecology | Admitting: Obstetrics & Gynecology

## 2021-03-25 VITALS — BP 118/64 | HR 78 | Resp 16 | Ht 67.0 in | Wt 171.0 lb

## 2021-03-25 DIAGNOSIS — G35 Multiple sclerosis: Secondary | ICD-10-CM | POA: Diagnosis not present

## 2021-03-25 DIAGNOSIS — Z01419 Encounter for gynecological examination (general) (routine) without abnormal findings: Secondary | ICD-10-CM | POA: Diagnosis not present

## 2021-03-25 DIAGNOSIS — R928 Other abnormal and inconclusive findings on diagnostic imaging of breast: Secondary | ICD-10-CM

## 2021-03-25 DIAGNOSIS — N920 Excessive and frequent menstruation with regular cycle: Secondary | ICD-10-CM

## 2021-03-25 DIAGNOSIS — R599 Enlarged lymph nodes, unspecified: Secondary | ICD-10-CM

## 2021-03-25 DIAGNOSIS — N6489 Other specified disorders of breast: Secondary | ICD-10-CM

## 2021-03-25 DIAGNOSIS — R922 Inconclusive mammogram: Secondary | ICD-10-CM | POA: Diagnosis not present

## 2021-03-25 LAB — CBC WITH DIFFERENTIAL/PLATELET
Basophils Absolute: 0.1 10*3/uL (ref 0.0–0.2)
Basos: 1 %
EOS (ABSOLUTE): 0.2 10*3/uL (ref 0.0–0.4)
Eos: 3 %
Hematocrit: 37.1 % (ref 34.0–46.6)
Hemoglobin: 12 g/dL (ref 11.1–15.9)
Immature Grans (Abs): 0 10*3/uL (ref 0.0–0.1)
Immature Granulocytes: 0 %
Lymphocytes Absolute: 4.2 10*3/uL — ABNORMAL HIGH (ref 0.7–3.1)
Lymphs: 54 %
MCH: 27.2 pg (ref 26.6–33.0)
MCHC: 32.3 g/dL (ref 31.5–35.7)
MCV: 84 fL (ref 79–97)
Monocytes Absolute: 0.5 10*3/uL (ref 0.1–0.9)
Monocytes: 6 %
Neutrophils Absolute: 2.8 10*3/uL (ref 1.4–7.0)
Neutrophils: 36 %
Platelets: 243 10*3/uL (ref 150–450)
RBC: 4.41 x10E6/uL (ref 3.77–5.28)
RDW: 13.6 % (ref 11.7–15.4)
WBC: 7.8 10*3/uL (ref 3.4–10.8)

## 2021-03-25 NOTE — Progress Notes (Signed)
Mary Jackson 12-15-1980 [id number]   History:    40 y.o. G0 Accompanied by sister.  Pt has MS.  RP:  New patient presenting for annual gyn exam   HPI: C/O longstanding heavy periods every month.  No BTB.  No pelvic pain.  Abstinent.  Had Dx Rt Mammo/US today.  Will need Rt breast Core Bx.  Urine/BMs normal.  BMI 26.78.  Health labs with Fam MD.  Past medical history,surgical history, family history and social history were all reviewed and documented in the EPIC chart.  Gynecologic History Patient's last menstrual period was 03/07/2021 (exact date).  Obstetric History OB History  Gravida Para Term Preterm AB Living  0 0 0 0 0 0  SAB IAB Ectopic Multiple Live Births  0 0 0 0 0     ROS: A ROS was performed and pertinent positives and negatives are included in the history.  GENERAL: No fevers or chills. HEENT: No change in vision, no earache, sore throat or sinus congestion. NECK: No pain or stiffness. CARDIOVASCULAR: No chest pain or pressure. No palpitations. PULMONARY: No shortness of breath, cough or wheeze. GASTROINTESTINAL: No abdominal pain, nausea, vomiting or diarrhea, melena or bright red blood per rectum. GENITOURINARY: No urinary frequency, urgency, hesitancy or dysuria. MUSCULOSKELETAL: No joint or muscle pain, no back pain, no recent trauma. DERMATOLOGIC: No rash, no itching, no lesions. ENDOCRINE: No polyuria, polydipsia, no heat or cold intolerance. No recent change in weight. HEMATOLOGICAL: No anemia or easy bruising or bleeding. NEUROLOGIC: No headache, seizures, numbness, tingling or weakness. PSYCHIATRIC: No depression, no loss of interest in normal activity or change in sleep pattern.     Exam:   BP 118/64   Pulse 78   Resp 16   Ht '5\' 7"'$  (1.702 m)   Wt 171 lb (77.6 kg)   LMP 03/07/2021 (Exact Date)   BMI 26.78 kg/m   Body mass index is 26.78 kg/m.  General appearance : Well developed well nourished female. No acute distress HEENT: Eyes: no retinal  hemorrhage or exudates,  Neck supple, trachea midline, no carotid bruits, no thyroidmegaly Lungs: Clear to auscultation, no rhonchi or wheezes, or rib retractions  Heart: Regular rate and rhythm, no murmurs or gallops Breast:Examined in sitting and supine position were symmetrical in appearance, no palpable masses or tenderness,  no skin retraction, no nipple inversion, no nipple discharge, no skin discoloration, no axillary or supraclavicular lymphadenopathy Abdomen: no palpable masses or tenderness, no rebound or guarding Extremities: no edema or skin discoloration or tenderness  Pelvic: Vulva: Normal             Vagina: No gross lesions or discharge  Cervix: No gross lesions or discharge.  Pap reflex done.  Uterus  RV, mildly increased size, nodular, non-tender and mobile  Adnexa  Without masses or tenderness  Anus: Normal  Hb 03/24/2021 at 12.0   Assessment/Plan:  40 y.o. female for annual exam   1. Encounter for routine gynecological examination with Papanicolaou smear of cervix Normal gynecologic exam.  Pap reflex done.  Breast exam normal.  Screening mammogram August 2022, right diagnostic mammogram and ultrasound today.  Will need right breast biopsy.  Good body mass index at 26.78.  Continue with fitness and healthy nutrition. - Cytology - PAP( [facility])  2. Menorrhagia with regular cycle Would like DepoProvera, will decide at Pelvic US visit.  Pamphlets given. - US Transvaginal Non-OB; Future  3. Secondary progressive multiple sclerosis ([place])  Other orders - traZODone (  DESYREL) 100 MG tablet   Princess Bruins MD, 3:19 PM 03/25/2021

## 2021-03-28 LAB — CYTOLOGY - PAP: Diagnosis: NEGATIVE

## 2021-03-29 ENCOUNTER — Encounter: Payer: Self-pay | Admitting: Obstetrics & Gynecology

## 2021-03-31 ENCOUNTER — Telehealth: Payer: Self-pay | Admitting: Family Medicine

## 2021-03-31 ENCOUNTER — Ambulatory Visit: Payer: Medicare HMO

## 2021-03-31 LAB — STRATIFY JCV AB (W/ INDEX) W/ RFLX
Index Value: 1.37 — ABNORMAL HIGH
Stratify JCV (TM) Ab w/Reflex Inhibition: POSITIVE — AB

## 2021-03-31 NOTE — Telephone Encounter (Signed)
Pt's sister called states she contacted the transportation through [facility], they informed her they need a pt intake form in order to transport her to her appt. Pt's sister did provide a fax number [phone] Attention: [employer]

## 2021-04-01 ENCOUNTER — Telehealth: Payer: Self-pay | Admitting: *Deleted

## 2021-04-01 ENCOUNTER — Ambulatory Visit
Admission: RE | Admit: 2021-04-01 | Discharge: 2021-04-01 | Disposition: A | Discharge status: Home / Self Care | Payer: Medicare HMO | Source: Ambulatory Visit | Attending: Internal Medicine | Admitting: Internal Medicine

## 2021-04-01 ENCOUNTER — Other Ambulatory Visit: Payer: Self-pay

## 2021-04-01 DIAGNOSIS — D241 Benign neoplasm of right breast: Secondary | ICD-10-CM | POA: Diagnosis not present

## 2021-04-01 DIAGNOSIS — N6489 Other specified disorders of breast: Secondary | ICD-10-CM

## 2021-04-01 DIAGNOSIS — N6311 Unspecified lump in the right breast, upper outer quadrant: Secondary | ICD-10-CM | POA: Diagnosis not present

## 2021-04-01 DIAGNOSIS — R928 Other abnormal and inconclusive findings on diagnostic imaging of breast: Secondary | ICD-10-CM | POA: Diagnosis not present

## 2021-04-01 DIAGNOSIS — R599 Enlarged lymph nodes, unspecified: Secondary | ICD-10-CM

## 2021-04-01 NOTE — Telephone Encounter (Signed)
Called and spoke with pt about results per Dr. Garth Bigness note. Offered sooner appt 04/10/21 at 9 or 930am per Dr. Felecia Shelling but pt declined, needed appt on Mon/Tues. Offered 04/07/21 at 9 or 930am. Pt relayed message to sister. She decided to keep appt for appt on 04/21/21 and will discuss options with Dr. Felecia Shelling then.

## 2021-04-01 NOTE — Telephone Encounter (Signed)
-----   Message from Britt Bottom, MD sent at 04/01/2021  8:29 AM EDT ----- Please let her know that the JCV antibody has converted from negative to positive.  Therefore, the risk of PML has increased.  I would like her to come in so we can discuss other treatment options and check appropriate lab work

## 2021-04-03 ENCOUNTER — Ambulatory Visit: Payer: Medicare HMO | Admitting: Neurology

## 2021-04-05 ENCOUNTER — Telehealth: Payer: Self-pay

## 2021-04-05 ENCOUNTER — Ambulatory Visit: Payer: Medicare HMO

## 2021-04-05 NOTE — Telephone Encounter (Signed)
Called pt for AWV. Pt did not answer could not leave VM due to  mailbox being full.  KP

## 2021-04-05 NOTE — Patient Instructions (Signed)
Health Maintenance, Female Adopting a healthy lifestyle and getting preventive care are important in promoting health and wellness. Ask your health care provider about: The right schedule for you to have regular tests and exams. Things you can do on your own to prevent diseases and keep yourself healthy. What should I know about diet, weight, and exercise? Eat a healthy diet  Eat a diet that includes plenty of vegetables, fruits, low-fat dairy products, and lean protein. Do not eat a lot of foods that are high in solid fats, added sugars, or sodium. Maintain a healthy weight Body mass index (BMI) is used to identify weight problems. It estimates body fat based on height and weight. Your health care provider can help determine your BMI and help you achieve or maintain a healthy weight. Get regular exercise Get regular exercise. This is one of the most important things you can do for your health. Most adults should: Exercise for at least 150 minutes each week. The exercise should increase your heart rate and make you sweat (moderate-intensity exercise). Do strengthening exercises at least twice a week. This is in addition to the moderate-intensity exercise. Spend less time sitting. Even light physical activity can be beneficial. Watch cholesterol and blood lipids Have your blood tested for lipids and cholesterol at 42 years of age, then have this test every 5 years. Have your cholesterol levels checked more often if: Your lipid or cholesterol levels are high. You are older than 42 years of age. You are at high risk for heart disease. What should I know about cancer screening? Depending on your health history and family history, you may need to have cancer screening at various ages. This may include screening for: Breast cancer. Cervical cancer. Colorectal cancer. Skin cancer. Lung cancer. What should I know about heart disease, diabetes, and high blood pressure? Blood pressure and heart  disease High blood pressure causes heart disease and increases the risk of stroke. This is more likely to develop in people who have high blood pressure readings, are of African descent, or are overweight. Have your blood pressure checked: Every 3-5 years if you are 18-39 years of age. Every year if you are 40 years old or older. Diabetes Have regular diabetes screenings. This checks your fasting blood sugar level. Have the screening done: Once every three years after age 40 if you are at a normal weight and have a low risk for diabetes. More often and at a younger age if you are overweight or have a high risk for diabetes. What should I know about preventing infection? Hepatitis B If you have a higher risk for hepatitis B, you should be screened for this virus. Talk with your health care provider to find out if you are at risk for hepatitis B infection. Hepatitis C Testing is recommended for: Everyone born from 1945 through 1965. Anyone with known risk factors for hepatitis C. Sexually transmitted infections (STIs) Get screened for STIs, including gonorrhea and chlamydia, if: You are sexually active and are younger than 42 years of age. You are older than 42 years of age and your health care provider tells you that you are at risk for this type of infection. Your sexual activity has changed since you were last screened, and you are at increased risk for chlamydia or gonorrhea. Ask your health care provider if you are at risk. Ask your health care provider about whether you are at high risk for HIV. Your health care provider may recommend a prescription medicine   to help prevent HIV infection. If you choose to take medicine to prevent HIV, you should first get tested for HIV. You should then be tested every 3 months for as long as you are taking the medicine. Pregnancy If you are about to stop having your period (premenopausal) and you may become pregnant, seek counseling before you get  pregnant. Take 400 to 800 micrograms (mcg) of folic acid every day if you become pregnant. Ask for birth control (contraception) if you want to prevent pregnancy. Osteoporosis and menopause Osteoporosis is a disease in which the bones lose minerals and strength with aging. This can result in bone fractures. If you are 65 years old or older, or if you are at risk for osteoporosis and fractures, ask your health care provider if you should: Be screened for bone loss. Take a calcium or vitamin D supplement to lower your risk of fractures. Be given hormone replacement therapy (HRT) to treat symptoms of menopause. Follow these instructions at home: Lifestyle Do not use any products that contain nicotine or tobacco, such as cigarettes, e-cigarettes, and chewing tobacco. If you need help quitting, ask your health care provider. Do not use street drugs. Do not share needles. Ask your health care provider for help if you need support or information about quitting drugs. Alcohol use Do not drink alcohol if: Your health care provider tells you not to drink. You are pregnant, may be pregnant, or are planning to become pregnant. If you drink alcohol: Limit how much you use to 0-1 drink a day. Limit intake if you are breastfeeding. Be aware of how much alcohol is in your drink. In the [place] one drink equals one 12 oz bottle of beer (355 mL), one 5 oz glass of wine (148 mL), or one 1 oz glass of hard liquor (44 mL). General instructions Schedule regular health, dental, and eye exams. Stay current with your vaccines. Tell your health care provider if: You often feel depressed. You have ever been abused or do not feel safe at home. Summary Adopting a healthy lifestyle and getting preventive care are important in promoting health and wellness. Follow your health care provider's instructions about healthy diet, exercising, and getting tested or screened for diseases. Follow your health care provider's  instructions on monitoring your cholesterol and blood pressure. This information is not intended to replace advice given to you by your health care provider. Make sure you discuss any questions you have with your health care provider. Document Revised: 09/06/2020 Document Reviewed: 06/22/2018 Elsevier Patient Education  2022 [employer]  

## 2021-04-09 ENCOUNTER — Other Ambulatory Visit: Payer: Self-pay | Admitting: Neurology

## 2021-04-21 ENCOUNTER — Encounter: Payer: Self-pay | Admitting: Neurology

## 2021-04-21 ENCOUNTER — Ambulatory Visit: Payer: Medicare HMO | Admitting: Neurology

## 2021-04-21 VITALS — BP 101/58 | HR 77 | Ht 67.0 in | Wt 172.5 lb

## 2021-04-21 DIAGNOSIS — M21371 Foot drop, right foot: Secondary | ICD-10-CM

## 2021-04-21 DIAGNOSIS — F32A Depression, unspecified: Secondary | ICD-10-CM | POA: Diagnosis not present

## 2021-04-21 DIAGNOSIS — E559 Vitamin D deficiency, unspecified: Secondary | ICD-10-CM

## 2021-04-21 DIAGNOSIS — R260 Ataxic gait: Secondary | ICD-10-CM

## 2021-04-21 DIAGNOSIS — Z79899 Other long term (current) drug therapy: Secondary | ICD-10-CM

## 2021-04-21 DIAGNOSIS — G35 Multiple sclerosis: Secondary | ICD-10-CM | POA: Diagnosis not present

## 2021-04-21 MED ORDER — ETODOLAC 400 MG PO TABS: 400.0000 mg | ORAL_TABLET | Freq: Two times a day (BID) | ORAL | 3 refills | Status: DC

## 2021-04-21 NOTE — Progress Notes (Addendum)
[facility]  PATIENT: Mary Jackson DOB: 1981/03/14  REFERRING DOCTOR OR PCP:  Dr. Lovenia Shuck SOURCE: Patient, extensive notes from neurology, imaging and laboratory reports, MRI images personally reviewed.  _________________________________   HISTORICAL  CHIEF COMPLAINT:  Chief Complaint  Patient presents with   Follow-up    RM 1. Last seen 10/01/20. MS-on Tysabri. Receives at [facility] w/ intrafusion. Last infusion date: 03/24/2021. Next infusion date: 05/05/2021. JCV converted to positive. Here to discuss other DMT options. Ambulates w/ rolling walker.    HISTORY OF PRESENT ILLNESS:  Mary Jackson is a 40 y.o. woman with relapsing/active form of secondary progressive multiple sclerosis.  Update 04/21/2020: She is on Tysabri and has no recent exacerbation.    Her last infusion was 9/22/20202.  JCV antibody 03/25/2020 was 1.43 (middle positive)   This mpies a higher risk of PML compared to when she was negative (risk is about 1/300)  She uses a walker due to right > left  leg weakness.  She has a right foot drop.  She sometimes stumbles due to the foot drop.   She takes Ampyra bid and feels it helps her gait some.   She has ataxia in her legs.   Baclofen has helped her spasms and gabapentin tid has helped her dysesthesias.     Bladder function is fine.   She has reduced vision OD and colors are desaturated OD.    Sometimes she has diplopia.      She is often fatigued.  She has trouble falling asleep and seldom goes to sleep before 1 am .  She does not snore.    She has had some depression, helped by sertraline.    She has seen psychology/counseling in the past for depression, stress management and other issues.     She had a breast biopsy and it returned benign.   She will   MS History:   She was diagnosed with MS in 2008 after presenting with left leg weakness and clumsiness and a fall.  Imaging studies were performed and she was diagnosed.   She was initially placed  on Copaxone but she had multiple relapses.   She was placed on Tysabri and is now on every 6 weeks.   I personally reviewed MRIs of the brain dated 4/2 2019 and 04/09/2016.  The MRI showed multiple T2/flair hyperintense foci with single and confluent lesions in the periventricular, juxtacortical and deep white matter and also is a few foci in the infratentorial white matter and apparently at the cervical medullary junction.  We do not have an MRI of the cervical spine.  MRI report from 2015 did mention that there were new lesions compared to the previous MRI.  She has no FH of MS.   Parents have HTN.     REVIEW OF SYSTEMS: Constitutional: No fevers, chills, sweats, or change in appetite Eyes: No visual changes, double vision, eye pain Ear, nose and throat: No hearing loss, ear pain, nasal congestion, sore throat Cardiovascular: No chest pain, palpitations Respiratory:  No shortness of breath at rest or with exertion.   No wheezes GastrointestinaI: No nausea, vomiting, diarrhea, abdominal pain, fecal incontinence Genitourinary:  No dysuria, urinary retention or frequency.  No nocturia. Musculoskeletal:  No neck pain, back pain Integumentary: No rash, pruritus, skin lesions Neurological: as above Psychiatric: No depression at this time.  No anxiety Endocrine: No palpitations, diaphoresis, change in appetite, change in weigh or increased thirst Hematologic/Lymphatic:  No anemia, purpura, petechiae. Allergic/Immunologic: No  itchy/runny eyes, nasal congestion, recent allergic reactions, rashes  ALLERGIES: No Known Allergies  HOME MEDICATIONS:  Current Outpatient Medications:    Ascorbic Acid (VITAMIN C PO), Take 1,000 mg by mouth daily., Disp: , Rfl:    baclofen (LIORESAL) 10 MG tablet, Take 1 tablet (10 mg total) by mouth 3 (three) times daily., Disp: 270 tablet, Rfl: 2   Cholecalciferol (VITAMIN D3 PO), Take 5,000 Units by mouth daily., Disp: , Rfl:    Cyanocobalamin (VITAMIN B-12 PO),  Take 1,000 mcg by mouth daily., Disp: , Rfl:    dalfampridine 10 MG TB12, Take 1 tablet (10 mg total) by mouth 2 (two) times daily., Disp: 60 tablet, Rfl: 11   etodolac (LODINE) 400 MG tablet, Take 1 tablet (400 mg total) by mouth 2 (two) times daily., Disp: 180 tablet, Rfl: 3   ferrous sulfate 324 MG TBEC, Take 324 mg by mouth., Disp: , Rfl:    gabapentin (NEURONTIN) 300 MG capsule, Take 1 capsule (300 mg total) by mouth 3 (three) times daily., Disp: 270 capsule, Rfl: 3   natalizumab (TYSABRI) 300 MG/15ML injection, Inject into the vein., Disp: , Rfl:    traZODone (DESYREL) 100 MG tablet, , Disp: , Rfl:    VITAMIN E PO, Take 400 Units by mouth daily., Disp: , Rfl:   PAST MEDICAL HISTORY: Past Medical History:  Diagnosis Date   Depression with anxiety    Migraines    MS (multiple sclerosis) ([place])     PAST SURGICAL HISTORY:   FAMILY HISTORY: Family History  Problem Relation Age of Onset   Hypertension Sister    Diabetes Sister     SOCIAL HISTORY:  Social History   Socioeconomic History   Marital status: Single    Spouse name: Not on file   Number of children: Not on file   Years of education: Not on file   Highest education level: Not on file  Occupational History   Not on file  Tobacco Use   Smoking status: Every Day    Types: Cigarettes   Smokeless tobacco: Never   Tobacco comments:    3-4 cigs per day  Substance and Sexual Activity   Alcohol use: Yes    Alcohol/week: 1.0 - 2.0 standard drink    Types: 1 - 2 Glasses of wine per week   Drug use: Yes    Types: Marijuana    Comment: oc   Sexual activity: Not Currently    Partners: Male  Other Topics Concern   Not on file  Social History Narrative   Right handed   Caffeine use: coffee, tea, soda   Lives with sister, Mary Jackson   Social Determinants of Health   Financial Resource Strain: Not on file  Food Insecurity: Not on file  Transportation Needs: Not on file  Physical Activity: Not on file  Stress: Not  on file  Social Connections: Not on file  Intimate Partner Violence: Not on file     PHYSICAL EXAM  Vitals:   04/21/21 1402  BP: (!) 101/58  Pulse: 77  Weight: 172 lb 8 oz (78.2 kg)  Height: 5\' 7"  (1.702 m)    Body mass index is 27.02 kg/m.   General: The patient is well-developed and well-nourished and in no acute distress  HEENT:  Head is [place]/AT.  Sclera are anicteric.  Funduscopic exam shows normal optic discs and retinal vessels.  Neck: Good range of motion.  Neurologic Exam  Mental status: The patient is alert and oriented x 3  at the time of the examination. The patient has apparent normal recent and remote memory, with an apparently normal attention span and concentration ability.   Speech is normal.  Cranial nerves: Extraocular movements show nystagmus on lateral gaze with slight disconjugate gaze looking right..   Facial strength is normal.  Trapezius and sternocleidomastoid strength is normal. No dysarthria is noted.    No obvious hearing deficits are noted.  Motor:  Muscle bulk is normal.   Tone is normal.  Strength is 5/5 in the arms, 4/5 proximally in the legs and in the feet and ankles 4+/5 the gastrocnemius muscles.     Sensory: Sensory testing is intact to pinprick, soft touch and vibration sensation in all 4 extremities.  Coordination: Cerebellar testing reveals mildly reduced left finger-nose-finger and mildly reduced right and moderately reduced left heel-to-shin .  Gait and station: Station is normal but she needs to use her hands to stand up from the chair.  She has an ataxic gait.  She uses a walker.   She is unable to tandem walk.  Romberg is positive.  Reflexes: Deep tendon reflexes are mildly increased in the arms, left greater than right.  Reflexes are increased with crossed abductor responses at the knees, left more than right.  She has a couple beats of nonsustained ankle clonus on the left..       ASSESSMENT AND PLAN  Secondary progressive  multiple sclerosis ([place]) - Plan: Ambulatory referral to Physical Therapy, HIV Antibody (routine testing w rflx), IgG, IgA, IgM, QuantiFERON-TB Gold Plus, Hepatitis B surface antigen, Hepatitis B core antibody, total, Hepatic function panel, Hepatitis C antibody, Hepatitis B surface antibody,qualitative, Varicella zoster antibody, IgG, CANCELED: CBC with Differential/Platelet  High risk medication use - Plan: HIV Antibody (routine testing w rflx), IgG, IgA, IgM, QuantiFERON-TB Gold Plus, Hepatitis B surface antigen, Hepatitis B core antibody, total, Hepatic function panel, Hepatitis C antibody, Hepatitis B surface antibody,qualitative, Varicella zoster antibody, IgG, CANCELED: CBC with Differential/Platelet  Ataxic gait - Plan: Ambulatory referral to Physical Therapy  Right foot drop - Plan: Ambulatory referral to Physical Therapy  Depression, unspecified depression type  Vitamin D deficiency   1.   Her JCV antibody titer has increased and is now in the middle positive range implying about a 1/300 chance of PML.  Therefore we need to consider a different disease modifying therapy.  We went over a few options and she was most interested in [place].  While going over the risks and benefits, she informed me that she just had a breast biopsy.  She thought she was told that there was not cancer.  However, the report does say that it showed an intraductal papilloma.  She has an appointment to see [place] surgery tomorrow.  My understanding is that this is a benign tumor and therefore she would probably still be a candidate for her Ocrevus.  However, I have asked her to discuss with her surgeon tomorrow.  If she is not a candidate for Ocrevus, we will need to consider one of the oral agents. 2.   Continue gabapentin, baclofen, sertraline 3.   Stay active and exercise as tolerated.   4.   Due to her right foot drop that affects her gait and increases her risk of falls, it is medically necessary that she get  a right AFO (addendum).   5.  RTC 6 months, sooner if new or worsening issues.  42-minute office visit with the majority of the time spent face-to-face for history and  physical, discussion/counseling and decision-making.  Additional time with record review and documentation.   Amariz Flamenco A. Felecia Shelling, MD, [place] 38/04/1750, 0:25 PM Certified in Neurology, Clinical Neurophysiology, Sleep Medicine and Neuroimaging  [facility] [address] [place], [place] [address] [phone]

## 2021-04-22 ENCOUNTER — Ambulatory Visit: Payer: Self-pay | Admitting: General Surgery

## 2021-04-22 DIAGNOSIS — D241 Benign neoplasm of right breast: Secondary | ICD-10-CM | POA: Diagnosis not present

## 2021-04-23 ENCOUNTER — Other Ambulatory Visit: Payer: Self-pay | Admitting: General Surgery

## 2021-04-23 DIAGNOSIS — D241 Benign neoplasm of right breast: Secondary | ICD-10-CM

## 2021-04-24 LAB — QUANTIFERON-TB GOLD PLUS
QuantiFERON Mitogen Value: >10 IU/mL
QuantiFERON Nil Value: 0.22 IU/mL
QuantiFERON TB1 Ag Value: 0.2 IU/mL
QuantiFERON TB2 Ag Value: 0.18 IU/mL
QuantiFERON-TB Gold Plus: NEGATIVE

## 2021-04-24 LAB — HEPATITIS C ANTIBODY: Hep C Virus Ab: <0.1 s/co ratio (ref 0.0–0.9)

## 2021-04-24 LAB — HEPATIC FUNCTION PANEL
ALT: 11 IU/L (ref 0–32)
AST: 16 IU/L (ref 0–40)
Albumin: 4.3 g/dL (ref 3.8–4.8)
Alkaline Phosphatase: 51 IU/L (ref 44–121)
Bilirubin Total: 0.3 mg/dL (ref 0.0–1.2)
Bilirubin, Direct: 0.12 mg/dL (ref 0.00–0.40)
Total Protein: 6.9 g/dL (ref 6.0–8.5)

## 2021-04-24 LAB — HEPATITIS B CORE ANTIBODY, TOTAL: Hep B Core Total Ab: NEGATIVE

## 2021-04-24 LAB — IGG, IGA, IGM
IgA/Immunoglobulin A, Serum: 314 mg/dL (ref 87–352)
IgG (Immunoglobin G), Serum: 1211 mg/dL (ref 586–1602)
IgM (Immunoglobulin M), Srm: 51 mg/dL (ref 26–217)

## 2021-04-24 LAB — HEPATITIS B SURFACE ANTIBODY,QUALITATIVE: Hep B Surface Ab, Qual: REACTIVE

## 2021-04-24 LAB — HIV ANTIBODY (ROUTINE TESTING W REFLEX): HIV Screen 4th Generation wRfx: NONREACTIVE

## 2021-04-24 LAB — HEPATITIS B SURFACE ANTIGEN: Hepatitis B Surface Ag: NEGATIVE

## 2021-04-24 LAB — VARICELLA ZOSTER ANTIBODY, IGG: Varicella zoster IgG: 3050 index (ref >165)

## 2021-04-28 ENCOUNTER — Telehealth: Payer: Self-pay | Admitting: Neurology

## 2021-04-28 NOTE — Telephone Encounter (Signed)
Called her sister back.  In reviewing the last office notes from 10/10 Dr. Felecia Shelling had recommended switching from Tysabri to [facility] but had wanted the patient to clarify with the general surgeon first.  When they asked about the tumor for sure being benign he says that he would know for sure until he removes the tissue and that procedure is not scheduled until December.  Patient is due for her next Tysabri dose 10/24.  Advised I would discuss this with Dr. Felecia Shelling and reach back out with what his thoughts and recommendations are.

## 2021-04-28 NOTE — Telephone Encounter (Signed)
Pt's sister, Rollene Fare [facility]) called, would like a call from the nurse to discuss if needs keep natalizumab (TYSABRI) 300 MG/15ML injection appt on 10/24. Dr. Felecia Shelling saide he needed to take her off of the medication because her JCB was too high.

## 2021-04-28 NOTE — Telephone Encounter (Signed)
Called her back and advised that Dr Felecia Shelling is ok with her getting ht Tysabri still every 6 weeks until we know for certain the determination on the surgery planned for Dec.  Pt's sister verbalized understanding.

## 2021-04-29 ENCOUNTER — Other Ambulatory Visit: Payer: Medicare HMO

## 2021-04-29 ENCOUNTER — Other Ambulatory Visit: Payer: Medicare HMO | Admitting: Obstetrics & Gynecology

## 2021-05-05 DIAGNOSIS — G35 Multiple sclerosis: Secondary | ICD-10-CM | POA: Diagnosis not present

## 2021-05-08 ENCOUNTER — Other Ambulatory Visit: Payer: Self-pay

## 2021-05-08 ENCOUNTER — Encounter: Payer: Self-pay | Admitting: Obstetrics & Gynecology

## 2021-05-08 ENCOUNTER — Ambulatory Visit (INDEPENDENT_AMBULATORY_CARE_PROVIDER_SITE_OTHER): Payer: Medicare HMO

## 2021-05-08 ENCOUNTER — Ambulatory Visit: Payer: Medicare HMO | Admitting: Obstetrics & Gynecology

## 2021-05-08 VITALS — BP 114/70

## 2021-05-08 DIAGNOSIS — N920 Excessive and frequent menstruation with regular cycle: Secondary | ICD-10-CM | POA: Diagnosis not present

## 2021-05-08 DIAGNOSIS — G35 Multiple sclerosis: Secondary | ICD-10-CM

## 2021-05-08 DIAGNOSIS — Z3009 Encounter for other general counseling and advice on contraception: Secondary | ICD-10-CM

## 2021-05-08 DIAGNOSIS — D252 Subserosal leiomyoma of uterus: Secondary | ICD-10-CM

## 2021-05-08 MED ORDER — MEDROXYPROGESTERONE ACETATE 150 MG/ML IM SUSP
150.0000 mg | Freq: Once | INTRAMUSCULAR | Status: AC
  Administered 2021-05-08: 150 mg via INTRAMUSCULAR

## 2021-05-08 MED ORDER — MEDROXYPROGESTERONE ACETATE 150 MG/ML IM SUSP: 150.0000 mg | INTRAMUSCULAR | 4 refills | Status: DC

## 2021-05-08 NOTE — Progress Notes (Addendum)
    Mary Jackson 11/09/1980 [id number]        40 y.o.  [id number]   RP: Menorrhagia for Pelvic US  HPI: Menorrhagia with regular menses.  No BTB.  No pelvic pain.   OB History  Gravida Para Term Preterm AB Living  0 0 0 0 0 0  SAB IAB Ectopic Multiple Live Births  0 0 0 0 0    Past medical history,surgical history, problem list, medications, allergies, family history and social history were all reviewed and documented in the EPIC chart.   Directed ROS with pertinent positives and negatives documented in the history of present illness/assessment and plan.  Exam:  Vitals:   05/08/21 1112  BP: 114/70   General appearance:  Normal  Pelvic US today: T/V images.  Retroverted uterus with a right lateral subserosal fibroid measured at 5.3 x 4.9 cm.  The uterus is measured at 7.64 x 7.03 x 4.72 cm.  Avascular thickened homogeneous endometrium measuring 20.79 mm with no definite mass or feeder vessels seen.  Patient is day 15 of her cycle.  Right ovary normal.  Left ovary with a corpus luteal cyst seen within normal.  No adnexal mass seen.  No free fluid in the pelvis.   Assessment/Plan:  40 y.o. G0  1. Menorrhagia with regular cycle Menorrhagia with regular menses.  No BTB.  No pelvic pain.  Pelvic US findings thoroughly reviewed with patient.  Given the absence of Endometrial lesion, decision to treat Menorrhagia with DepoProvera.  1st injection given today.  If no improvement in patient's menstrual flow, will recheck by pelvic US.  2. Subserous leiomyoma of uterus Patient informed, will observe.  3. Secondary progressive multiple sclerosis ([place])  Other orders - medroxyPROGESTERone (DEPO-PROVERA) injection 150 mg - medroxyPROGESTERone (DEPO-PROVERA) 150 MG/ML injection; Inject 1 mL (150 mg total) into the muscle every 3 (three) months.   Princess Bruins MD, 11:28 AM 05/08/2021

## 2021-05-09 ENCOUNTER — Encounter: Payer: Self-pay | Admitting: Obstetrics & Gynecology

## 2021-06-09 ENCOUNTER — Other Ambulatory Visit: Payer: Self-pay

## 2021-06-09 ENCOUNTER — Encounter (HOSPITAL_BASED_OUTPATIENT_CLINIC_OR_DEPARTMENT_OTHER): Payer: Self-pay | Admitting: General Surgery

## 2021-06-11 DIAGNOSIS — G35 Multiple sclerosis: Secondary | ICD-10-CM | POA: Diagnosis not present

## 2021-06-11 NOTE — Progress Notes (Signed)

## 2021-06-16 ENCOUNTER — Ambulatory Visit: Payer: Medicare HMO | Admitting: Internal Medicine

## 2021-06-16 ENCOUNTER — Ambulatory Visit
Admission: RE | Admit: 2021-06-16 | Discharge: 2021-06-16 | Disposition: A | Discharge status: Home / Self Care | Payer: Medicare HMO | Source: Ambulatory Visit | Attending: General Surgery | Admitting: General Surgery

## 2021-06-16 ENCOUNTER — Ambulatory Visit: Payer: Self-pay | Admitting: General Surgery

## 2021-06-16 DIAGNOSIS — D241 Benign neoplasm of right breast: Secondary | ICD-10-CM

## 2021-06-17 ENCOUNTER — Ambulatory Visit (HOSPITAL_BASED_OUTPATIENT_CLINIC_OR_DEPARTMENT_OTHER): Payer: Medicare HMO | Admitting: Anesthesiology

## 2021-06-17 ENCOUNTER — Encounter (HOSPITAL_BASED_OUTPATIENT_CLINIC_OR_DEPARTMENT_OTHER)
Admission: RE | Disposition: A | Discharge status: Home / Self Care | Payer: Self-pay | Source: Home / Self Care | Attending: General Surgery

## 2021-06-17 ENCOUNTER — Ambulatory Visit (HOSPITAL_BASED_OUTPATIENT_CLINIC_OR_DEPARTMENT_OTHER)
Admission: RE | Admit: 2021-06-17 | Discharge: 2021-06-17 | Disposition: A | Discharge status: Home / Self Care | Payer: Medicare HMO | Attending: General Surgery | Admitting: General Surgery

## 2021-06-17 ENCOUNTER — Ambulatory Visit
Admission: RE | Admit: 2021-06-17 | Discharge: 2021-06-17 | Disposition: A | Discharge status: Home / Self Care | Payer: Medicare HMO | Source: Ambulatory Visit | Attending: General Surgery | Admitting: General Surgery

## 2021-06-17 ENCOUNTER — Encounter (HOSPITAL_BASED_OUTPATIENT_CLINIC_OR_DEPARTMENT_OTHER): Payer: Self-pay | Admitting: General Surgery

## 2021-06-17 DIAGNOSIS — N6081 Other benign mammary dysplasias of right breast: Secondary | ICD-10-CM | POA: Insufficient documentation

## 2021-06-17 DIAGNOSIS — N62 Hypertrophy of breast: Secondary | ICD-10-CM | POA: Diagnosis not present

## 2021-06-17 DIAGNOSIS — F17210 Nicotine dependence, cigarettes, uncomplicated: Secondary | ICD-10-CM | POA: Diagnosis not present

## 2021-06-17 DIAGNOSIS — N6011 Diffuse cystic mastopathy of right breast: Secondary | ICD-10-CM | POA: Diagnosis not present

## 2021-06-17 DIAGNOSIS — N6021 Fibroadenosis of right breast: Secondary | ICD-10-CM | POA: Insufficient documentation

## 2021-06-17 DIAGNOSIS — L859 Epidermal thickening, unspecified: Secondary | ICD-10-CM | POA: Insufficient documentation

## 2021-06-17 DIAGNOSIS — D241 Benign neoplasm of right breast: Secondary | ICD-10-CM

## 2021-06-17 DIAGNOSIS — R928 Other abnormal and inconclusive findings on diagnostic imaging of breast: Secondary | ICD-10-CM | POA: Diagnosis not present

## 2021-06-17 DIAGNOSIS — G35 Multiple sclerosis: Secondary | ICD-10-CM | POA: Diagnosis not present

## 2021-06-17 DIAGNOSIS — R920 Mammographic microcalcification found on diagnostic imaging of breast: Secondary | ICD-10-CM | POA: Insufficient documentation

## 2021-06-17 HISTORY — PX: BREAST LUMPECTOMY WITH RADIOACTIVE SEED LOCALIZATION: SHX6424

## 2021-06-17 HISTORY — PX: BREAST EXCISIONAL BIOPSY: SUR124

## 2021-06-17 LAB — POCT PREGNANCY, URINE: Preg Test, Ur: NEGATIVE

## 2021-06-17 SURGERY — BREAST LUMPECTOMY WITH RADIOACTIVE SEED LOCALIZATION
Anesthesia: General | Site: Breast | Laterality: Right

## 2021-06-17 MED ORDER — ACETAMINOPHEN 500 MG PO TABS
ORAL_TABLET | ORAL | Status: AC
  Filled 2021-06-17: qty 2

## 2021-06-17 MED ORDER — DEXAMETHASONE SODIUM PHOSPHATE 10 MG/ML IJ SOLN
INTRAMUSCULAR | Status: AC
  Filled 2021-06-17: qty 1

## 2021-06-17 MED ORDER — GABAPENTIN 300 MG PO CAPS: 300.0000 mg | ORAL_CAPSULE | ORAL | Status: DC

## 2021-06-17 MED ORDER — CHLORHEXIDINE GLUCONATE CLOTH 2 % EX PADS: 6.0000 | MEDICATED_PAD | Freq: Once | CUTANEOUS | Status: DC

## 2021-06-17 MED ORDER — ONDANSETRON HCL 4 MG/2ML IJ SOLN
INTRAMUSCULAR | Status: DC | PRN
  Administered 2021-06-17: 4 mg via INTRAVENOUS

## 2021-06-17 MED ORDER — EPHEDRINE 5 MG/ML INJ
INTRAVENOUS | Status: AC
  Filled 2021-06-17: qty 5

## 2021-06-17 MED ORDER — PROPOFOL 10 MG/ML IV BOLUS
INTRAVENOUS | Status: AC
  Filled 2021-06-17: qty 20

## 2021-06-17 MED ORDER — MIDAZOLAM HCL 2 MG/2ML IJ SOLN
INTRAMUSCULAR | Status: AC
  Filled 2021-06-17: qty 2

## 2021-06-17 MED ORDER — ACETAMINOPHEN 500 MG PO TABS
1000.0000 mg | ORAL_TABLET | Freq: Once | ORAL | Status: AC
  Administered 2021-06-17: 1000 mg via ORAL

## 2021-06-17 MED ORDER — BUPIVACAINE-EPINEPHRINE (PF) 0.25% -1:200000 IJ SOLN
INTRAMUSCULAR | Status: AC
  Filled 2021-06-17: qty 30

## 2021-06-17 MED ORDER — PROPOFOL 10 MG/ML IV BOLUS
INTRAVENOUS | Status: DC | PRN
  Administered 2021-06-17: 150 mg via INTRAVENOUS
  Administered 2021-06-17: 50 mg via INTRAVENOUS

## 2021-06-17 MED ORDER — ONDANSETRON HCL 4 MG/2ML IJ SOLN
INTRAMUSCULAR | Status: AC
  Filled 2021-06-17: qty 2

## 2021-06-17 MED ORDER — FENTANYL CITRATE (PF) 100 MCG/2ML IJ SOLN
INTRAMUSCULAR | Status: AC
  Filled 2021-06-17: qty 2

## 2021-06-17 MED ORDER — EPHEDRINE SULFATE 50 MG/ML IJ SOLN
INTRAMUSCULAR | Status: DC | PRN
  Administered 2021-06-17: 5 mg via INTRAVENOUS
  Administered 2021-06-17 (×2): 10 mg via INTRAVENOUS

## 2021-06-17 MED ORDER — FENTANYL CITRATE (PF) 100 MCG/2ML IJ SOLN
INTRAMUSCULAR | Status: DC | PRN
  Administered 2021-06-17 (×2): 50 ug via INTRAVENOUS

## 2021-06-17 MED ORDER — ACETAMINOPHEN 500 MG PO TABS: 1000.0000 mg | ORAL_TABLET | ORAL | Status: DC

## 2021-06-17 MED ORDER — CEFAZOLIN SODIUM-DEXTROSE 2-4 GM/100ML-% IV SOLN: 2.0000 g | INTRAVENOUS | Status: DC

## 2021-06-17 MED ORDER — DEXAMETHASONE SODIUM PHOSPHATE 4 MG/ML IJ SOLN
INTRAMUSCULAR | Status: DC | PRN
  Administered 2021-06-17: 8 mg via INTRAVENOUS

## 2021-06-17 MED ORDER — BUPIVACAINE-EPINEPHRINE (PF) 0.25% -1:200000 IJ SOLN
INTRAMUSCULAR | Status: DC | PRN
  Administered 2021-06-17: 20 mL

## 2021-06-17 MED ORDER — FENTANYL CITRATE (PF) 100 MCG/2ML IJ SOLN: 25.0000 ug | INTRAMUSCULAR | Status: DC | PRN

## 2021-06-17 MED ORDER — CELECOXIB 200 MG PO CAPS
ORAL_CAPSULE | ORAL | Status: AC
  Filled 2021-06-17: qty 1

## 2021-06-17 MED ORDER — LIDOCAINE 2% (20 MG/ML) 5 ML SYRINGE
INTRAMUSCULAR | Status: DC | PRN
  Administered 2021-06-17: 60 mg via INTRAVENOUS

## 2021-06-17 MED ORDER — HYDROCODONE-ACETAMINOPHEN 5-325 MG PO TABS: 1.0000 | ORAL_TABLET | Freq: Four times a day (QID) | ORAL | 0 refills | Status: DC | PRN

## 2021-06-17 MED ORDER — LIDOCAINE 2% (20 MG/ML) 5 ML SYRINGE
INTRAMUSCULAR | Status: AC
  Filled 2021-06-17: qty 5

## 2021-06-17 MED ORDER — CELECOXIB 200 MG PO CAPS
200.0000 mg | ORAL_CAPSULE | ORAL | Status: AC
  Administered 2021-06-17: 200 mg via ORAL

## 2021-06-17 MED ORDER — MIDAZOLAM HCL 5 MG/5ML IJ SOLN
INTRAMUSCULAR | Status: DC | PRN
  Administered 2021-06-17: 2 mg via INTRAVENOUS

## 2021-06-17 MED ORDER — CEFAZOLIN SODIUM-DEXTROSE 2-4 GM/100ML-% IV SOLN
2.0000 g | INTRAVENOUS | Status: AC
  Administered 2021-06-17: 2 g via INTRAVENOUS

## 2021-06-17 MED ORDER — CELECOXIB 200 MG PO CAPS: 200.0000 mg | ORAL_CAPSULE | ORAL | Status: DC

## 2021-06-17 MED ORDER — LACTATED RINGERS IV SOLN: INTRAVENOUS | Status: DC

## 2021-06-17 MED ORDER — CEFAZOLIN SODIUM-DEXTROSE 2-4 GM/100ML-% IV SOLN
INTRAVENOUS | Status: AC
  Filled 2021-06-17: qty 100

## 2021-06-17 SURGICAL SUPPLY — 33 items
ADH SKN CLS APL DERMABOND .7 (GAUZE/BANDAGES/DRESSINGS) ×1
APL PRP STRL LF DISP 70% ISPRP (MISCELLANEOUS) ×1
BLADE SURG 15 STRL LF DISP TIS (BLADE) ×1 IMPLANT
BLADE SURG 15 STRL SS (BLADE) ×2
CANISTER SUC SOCK COL 7IN (MISCELLANEOUS) ×2 IMPLANT
CANISTER SUCT 1200ML W/VALVE (MISCELLANEOUS) ×2 IMPLANT
CHLORAPREP W/TINT 26 (MISCELLANEOUS) ×2 IMPLANT
COVER BACK TABLE 60X90IN (DRAPES) ×2 IMPLANT
COVER MAYO STAND STRL (DRAPES) ×2 IMPLANT
COVER PROBE W GEL 5X96 (DRAPES) ×2 IMPLANT
DERMABOND ADVANCED (GAUZE/BANDAGES/DRESSINGS) ×1
DERMABOND ADVANCED .7 DNX12 (GAUZE/BANDAGES/DRESSINGS) ×1 IMPLANT
DRAPE LAPAROSCOPIC ABDOMINAL (DRAPES) ×2 IMPLANT
DRAPE UTILITY XL STRL (DRAPES) ×2 IMPLANT
ELECT COATED BLADE 2.86 ST (ELECTRODE) ×2 IMPLANT
ELECT REM PT RETURN 9FT ADLT (ELECTROSURGICAL) ×2
ELECTRODE REM PT RTRN 9FT ADLT (ELECTROSURGICAL) ×1 IMPLANT
GLOVE SURG ENC MOIS LTX SZ7.5 (GLOVE) ×3 IMPLANT
GLOVE SURG UNDER POLY LF SZ7 (GLOVE) ×1 IMPLANT
GOWN STRL REUS W/ TWL LRG LVL3 (GOWN DISPOSABLE) ×2 IMPLANT
GOWN STRL REUS W/TWL LRG LVL3 (GOWN DISPOSABLE) ×4
KIT MARKER MARGIN INK (KITS) ×2 IMPLANT
NDL HYPO 25X1 1.5 SAFETY (NEEDLE) IMPLANT
NEEDLE HYPO 25X1 1.5 SAFETY (NEEDLE) ×2 IMPLANT
PACK BASIN DAY SURGERY FS (CUSTOM PROCEDURE TRAY) ×2 IMPLANT
PENCIL SMOKE EVACUATOR (MISCELLANEOUS) ×2 IMPLANT
SLEEVE SCD COMPRESS KNEE MED (STOCKING) ×2 IMPLANT
SPONGE T-LAP 18X18 ~~LOC~~+RFID (SPONGE) ×2 IMPLANT
SUT MON AB 4-0 PC3 18 (SUTURE) ×2 IMPLANT
SUT VICRYL 3-0 CR8 SH (SUTURE) ×2 IMPLANT
SYR CONTROL 10ML LL (SYRINGE) ×1 IMPLANT
TOWEL GREEN STERILE FF (TOWEL DISPOSABLE) ×2 IMPLANT
TRAY FAXITRON CT DISP (TRAY / TRAY PROCEDURE) ×2 IMPLANT

## 2021-06-17 NOTE — Anesthesia Procedure Notes (Signed)
Procedure Name: LMA Insertion Date/Time: 06/17/2021 8:31 AM Performed by: Ezequiel Kayser, CRNA Pre-anesthesia Checklist: Patient identified, Emergency Drugs available, Suction available and Patient being monitored Patient Re-evaluated:Patient Re-evaluated prior to induction Oxygen Delivery Method: Circle System Utilized Preoxygenation: Pre-oxygenation with 100% oxygen Induction Type: IV induction Ventilation: Mask ventilation without difficulty LMA: LMA inserted LMA Size: 4.0 Number of attempts: 1 Airway Equipment and Method: Bite block Placement Confirmation: positive ETCO2 Tube secured with: Tape Dental Injury: Teeth and Oropharynx as per pre-operative assessment

## 2021-06-17 NOTE — Op Note (Signed)
06/17/2021  9:23 AM  PATIENT:  Mary Jackson  40 y.o. female  PRE-OPERATIVE DIAGNOSIS:  RIGHT BREAST PAPILLOMA  POST-OPERATIVE DIAGNOSIS:  RIGHT BREAST PAPILLOMA  PROCEDURE:  Procedure(s): RIGHT BREAST LUMPECTOMY WITH RADIOACTIVE SEED LOCALIZATION (Right)  SURGEON:  Surgeon(s) and Role:    * Jovita Kussmaul, MD - Primary  PHYSICIAN ASSISTANT:   ASSISTANTS: none   ANESTHESIA:   local and general  EBL:  1 mL   BLOOD ADMINISTERED:none  DRAINS: none   LOCAL MEDICATIONS USED:  MARCAINE     SPECIMEN:  Source of Specimen:  right breast tissue  DISPOSITION OF SPECIMEN:  PATHOLOGY  COUNTS:  YES  TOURNIQUET:  * No tourniquets in log *  DICTATION: .Dragon Dictation  After informed consent was obtained the patient was brought to the operating room and placed in the supine position on the operating table.  After adequate induction of general anesthesia the patient's right breast was prepped with ChloraPrep, allowed to dry, and draped in usual sterile manner.  An appropriate timeout was performed.  The patient had an area of discordance in the upper portion of the right breast.  Previously an I-125 seed was placed in the upper portion of the right breast to mark the area of discordance.  The patient also had an abnormal lymph node in the right axilla.  If the findings in the breast are benign then the radiologist plan to follow the lymph node and reimage it in 6 months.  If the findings in the breast come back malignant then they will make a second attempt at biopsying the lymph node.  At this point the neoprobe was set to I-125 in the area of radioactivity was readily identified in the upper central portion of the right breast.  The area around this was infiltrated with quarter percent Marcaine.  A curvilinear incision was made along the upper edge of the areola of the right breast with a 15 blade knife.  The incision was carried through the skin and subcutaneous tissue sharply with the  electrocautery.  Dissection was then carried superiorly between the breast tissue and the subcutaneous fat and skin.  Once the dissection was beyond the area of the radioactive seed then a circular portion of breast tissue was excised sharply with the electrocautery around the radioactive seed while checking the area of radioactivity frequently.  Once the specimen was removed it was oriented with the appropriate paint colors.  A specimen radiograph was obtained that showed the clip and seed to be within the center of the specimen.  The specimen was then sent to pathology for further evaluation.  Hemostasis was achieved using the Bovie electrocautery.  The wound was irrigated with saline and infiltrated with more quarter percent Marcaine.  The deep layer of the wound was then closed with layers of interrupted 3-0 Vicryl stitches.  The skin was closed with interrupted 4-0 Monocryl subcuticular stitches.  Dermabond dressings were applied.  The patient tolerated the procedure well.  At the end of the case all needle sponge and instrument counts were correct.  The patient was then awakened and taken to recovery in stable condition.  PLAN OF CARE: Discharge to home after PACU  PATIENT DISPOSITION:  PACU - hemodynamically stable.   Delay start of Pharmacological VTE agent (>24hrs) due to surgical blood loss or risk of bleeding: not applicable

## 2021-06-17 NOTE — Transfer of Care (Signed)
Immediate Anesthesia Transfer of Care Note  Patient: Mary Jackson  Procedure(s) Performed: RIGHT BREAST LUMPECTOMY WITH RADIOACTIVE SEED LOCALIZATION (Right: Breast)  Patient Location: PACU  Anesthesia Type:General  Level of Consciousness: drowsy  Airway & Oxygen Therapy: Patient Spontanous Breathing and Patient connected to face mask oxygen  Post-op Assessment: Report given to RN and Post -op Vital signs reviewed and stable  Post vital signs: Reviewed and stable  Last Vitals:  Vitals Value Taken Time  BP 120/51 06/17/21 0919  Temp    Pulse 63 06/17/21 0920  Resp 13 06/17/21 0920  SpO2 100 % 06/17/21 0920  Vitals shown include unvalidated device data.  Last Pain:  Vitals:   06/17/21 0706  TempSrc: Oral  PainSc: 5          Complications: No notable events documented.

## 2021-06-17 NOTE — Discharge Instructions (Signed)
Next dose of OTC Tylenol/ Ibuprofen/NSAIDs after 1:15pm for pain as needed.    Post Anesthesia Home Care Instructions  Activity: Get plenty of rest for the remainder of the day. A responsible individual must stay with you for 24 hours following the procedure.  For the next 24 hours, DO NOT: -Drive a car -Paediatric nurse -Drink alcoholic beverages -Take any medication unless instructed by your physician -Make any legal decisions or sign important papers.  Meals: Start with liquid foods such as gelatin or soup. Progress to regular foods as tolerated. Avoid greasy, spicy, heavy foods. If nausea and/or vomiting occur, drink only clear liquids until the nausea and/or vomiting subsides. Call your physician if vomiting continues.  Special Instructions/Symptoms: Your throat may feel dry or sore from the anesthesia or the breathing tube placed in your throat during surgery. If this causes discomfort, gargle with warm salt water. The discomfort should disappear within 24 hours.  If you had a scopolamine patch placed behind your ear for the management of post- operative nausea and/or vomiting:  1. The medication in the patch is effective for 72 hours, after which it should be removed.  Wrap patch in a tissue and discard in the trash. Wash hands thoroughly with soap and water. 2. You may remove the patch earlier than 72 hours if you experience unpleasant side effects which may include dry mouth, dizziness or visual disturbances. 3. Avoid touching the patch. Wash your hands with soap and water after contact with the patch.

## 2021-06-17 NOTE — H&P (Signed)
REFERRING PHYSICIAN: Janith Lima, MD  PROVIDER: Landry Corporal, MD  MRN: [id number] DOB: 11-11-1980 Subjective  Chief Complaint: New Consultation (Intraductal papilloma)   History of Present Illness: Mary Jackson is a 40 y.o. female who is seen today as an office consultation at the request of Dr. Ronnald Ramp for evaluation of New Consultation (Intraductal papilloma) .   We are asked to see the patient in consultation by Dr. Scarlette Calico to evaluate her for a papilloma of the right breast. The patient is a 40 year old black female who recently went for a routine screening mammogram. At that time she was found to have a 3.8cm distortion in the upper right breast. This was biopsied and came back as an intraductal papilloma. She does have MS. She also has a family history of breast cancer in her aunt and niece. She also smokes  Review of Systems: A complete review of systems was obtained from the patient. I have reviewed this information and discussed as appropriate with the patient. See HPI as well for other ROS.  ROS   Medical History: Past Medical History:  Diagnosis Date   Anemia   Depression   Multiple sclerosis (CMS-HCC)   Patient Active Problem List  Diagnosis   Intraductal papilloma of right breast   History reviewed. No pertinent surgical history.   No Known Allergies  Current Outpatient Medications on File Prior to Visit  Medication Sig Dispense Refill   baclofen (LIORESAL) 10 MG tablet   dalfampridine 10 mg Tb12 Take 10 mg by mouth 2 (two) times daily   etodolac (LODINE) 400 MG tablet Take 400 mg by mouth 2 (two) times daily   ferrous sulfate 324 mg (65 mg iron) EC tablet Take by mouth   gabapentin (NEURONTIN) 300 MG capsule   natalizumab (TYSABRI) 300 mg/15 mL injection Inject into the vein   traZODone (DESYREL) 100 MG tablet   No current facility-administered medications on file prior to visit.   Family History  Problem Relation Age of Onset   High blood  pressure (Hypertension) Sister   Diabetes Sister    Social History   Tobacco Use  Smoking Status Current Every Day Smoker  Smokeless Tobacco Never Used    Social History   Socioeconomic History   Marital status: Single  Tobacco Use   Smoking status: Current Every Day Smoker   Smokeless tobacco: Never Used  Scientific laboratory technician Use: Never used  Substance and Sexual Activity   Alcohol use: Yes   Drug use: Never   Objective:   Vitals:  BP: 112/80  Pulse: 68  SpO2: 99%  Weight: 78.6 kg (173 lb 3.2 oz)  Height: 170.2 cm (5\' 7" )   Body mass index is 27.13 kg/m.  Physical Exam Vitals reviewed.  Constitutional:  General: She is not in acute distress. Appearance: Normal appearance.  HENT:  Head: Normocephalic and atraumatic.  Right Ear: External ear normal.  Left Ear: External ear normal.  Nose: Nose normal.  Mouth/Throat:  Mouth: Mucous membranes are moist.  Pharynx: Oropharynx is clear.  Eyes:  General: No scleral icterus. Extraocular Movements: Extraocular movements intact.  Conjunctiva/sclera: Conjunctivae normal.  Pupils: Pupils are equal, round, and reactive to light.  Cardiovascular:  Rate and Rhythm: Normal rate and regular rhythm.  Pulses: Normal pulses.  Heart sounds: Normal heart sounds.  Pulmonary:  Effort: Pulmonary effort is normal. No respiratory distress.  Breath sounds: Normal breath sounds.  Abdominal:  General: Bowel sounds are normal.  Palpations: Abdomen  is soft.  Tenderness: There is no abdominal tenderness.  Musculoskeletal:  General: No swelling, tenderness or deformity. Normal range of motion.  Cervical back: Normal range of motion and neck supple.  Skin: General: Skin is warm and dry.  Coloration: Skin is not jaundiced.  Neurological:  General: No focal deficit present.  Mental Status: She is alert and oriented to person, place, and time.  Gait: Gait abnormal.  Psychiatric:  Mood and Affect: Mood normal.  Behavior: Behavior  normal.   Breast: there is no palpable mass in either breast. There is no palpable axillary, supraclavicular, or cervical lymphadenopathy  Labs, Imaging and Diagnostic Testing:  Assessment and Plan:  Diagnoses and all orders for this visit:  Intraductal papilloma of right breast - [facility] Case Posting Request; Future    The patient appears to have an intraductal papilloma of the right breast associated with a 3.8cm area of distortion. Because of the size there is possibly a 5-10% chance of there being something more significant there. I would recommend that this area be removed. I have discussed with her the risks and benefits of the surgery as well as some of the technical aspects including the use of a radioactive seed and she understands and wishes to proceed.

## 2021-06-17 NOTE — Interval H&P Note (Signed)
History and Physical Interval Note:  06/17/2021 8:13 AM  [name]  has presented today for surgery, with the diagnosis of RIGHT BREAST PAPILLOMA.  The various methods of treatment have been discussed with the patient and family. After consideration of risks, benefits and other options for treatment, the patient has consented to  Procedure(s): RIGHT BREAST LUMPECTOMY WITH RADIOACTIVE SEED LOCALIZATION (Right) as a surgical intervention.  The patient's history has been reviewed, patient examined, no change in status, stable for surgery.  I have reviewed the patient's chart and labs.  Questions were answered to the patient's satisfaction.     Mary Jackson

## 2021-06-17 NOTE — Anesthesia Postprocedure Evaluation (Signed)
Anesthesia Post Note  Patient: Mary Jackson  Procedure(s) Performed: RIGHT BREAST LUMPECTOMY WITH RADIOACTIVE SEED LOCALIZATION (Right: Breast)     Patient location during evaluation: PACU Anesthesia Type: General Level of consciousness: awake and alert Pain management: pain level controlled Vital Signs Assessment: post-procedure vital signs reviewed and stable Respiratory status: spontaneous breathing, nonlabored ventilation, respiratory function stable and patient connected to nasal cannula oxygen Cardiovascular status: blood pressure returned to baseline and stable Postop Assessment: no apparent nausea or vomiting Anesthetic complications: no   No notable events documented.  Last Vitals:  Vitals:   06/17/21 0930 06/17/21 0954  BP: 122/69 119/65  Pulse: 77 75  Resp: (!) 21 18  Temp:  36.7 C  SpO2: 99% 97%    Last Pain:  Vitals:   06/17/21 0954  TempSrc:   PainSc: 0-No pain                 Johnay Mano L Ailah Barna

## 2021-06-17 NOTE — Anesthesia Preprocedure Evaluation (Addendum)
Anesthesia Evaluation  Patient identified by MRN, date of birth, ID band Patient awake    Reviewed: Allergy & Precautions, NPO status , Patient's Chart, lab work & pertinent test results  Airway Mallampati: I  TM Distance: >3 FB Neck ROM: Full    Dental no notable dental hx. (+) Teeth Intact, Dental Advisory Given   Pulmonary Current Smoker and Patient abstained from smoking.,    Pulmonary exam normal breath sounds clear to auscultation       Cardiovascular negative cardio ROS Normal cardiovascular exam Rhythm:Regular Rate:Normal     Neuro/Psych  Headaches, PSYCHIATRIC DISORDERS Anxiety Depression MS, uses walker at home    GI/Hepatic negative GI ROS, Neg liver ROS,   Endo/Other  negative endocrine ROS  Renal/GU negative Renal ROS  negative genitourinary   Musculoskeletal negative musculoskeletal ROS (+)   Abdominal   Peds  Hematology negative hematology ROS (+)   Anesthesia Other Findings   Reproductive/Obstetrics                           Anesthesia Physical Anesthesia Plan  ASA: 2  Anesthesia Plan: General   Post-op Pain Management:    Induction: Intravenous  PONV Risk Score and Plan: 2 and Ondansetron, Dexamethasone and Midazolam  Airway Management Planned: LMA  Additional Equipment:   Intra-op Plan:   Post-operative Plan: Extubation in OR  Informed Consent: I have reviewed the patients History and Physical, chart, labs and discussed the procedure including the risks, benefits and alternatives for the proposed anesthesia with the patient or authorized representative who has indicated his/her understanding and acceptance.     Dental advisory given  Plan Discussed with: CRNA  Anesthesia Plan Comments:         Anesthesia Quick Evaluation

## 2021-06-18 LAB — SURGICAL PATHOLOGY

## 2021-06-19 ENCOUNTER — Encounter (HOSPITAL_BASED_OUTPATIENT_CLINIC_OR_DEPARTMENT_OTHER): Payer: Self-pay | Admitting: General Surgery

## 2021-06-23 ENCOUNTER — Ambulatory Visit: Payer: Medicare HMO | Attending: Neurology | Admitting: Physical Therapy

## 2021-06-23 ENCOUNTER — Encounter: Payer: Self-pay | Admitting: Physical Therapy

## 2021-06-23 ENCOUNTER — Other Ambulatory Visit: Payer: Self-pay

## 2021-06-23 DIAGNOSIS — R29818 Other symptoms and signs involving the nervous system: Secondary | ICD-10-CM | POA: Diagnosis not present

## 2021-06-23 DIAGNOSIS — R262 Difficulty in walking, not elsewhere classified: Secondary | ICD-10-CM | POA: Diagnosis not present

## 2021-06-23 DIAGNOSIS — M6281 Muscle weakness (generalized): Secondary | ICD-10-CM | POA: Diagnosis not present

## 2021-06-23 DIAGNOSIS — G35 Multiple sclerosis: Secondary | ICD-10-CM | POA: Insufficient documentation

## 2021-06-23 DIAGNOSIS — R260 Ataxic gait: Secondary | ICD-10-CM | POA: Diagnosis not present

## 2021-06-23 DIAGNOSIS — M21371 Foot drop, right foot: Secondary | ICD-10-CM | POA: Diagnosis not present

## 2021-06-23 DIAGNOSIS — R2689 Other abnormalities of gait and mobility: Secondary | ICD-10-CM | POA: Diagnosis not present

## 2021-06-23 NOTE — Therapy (Addendum)
[facility] [address] [place], [place], [address] Phone: [phone]   Fax:  [phone]  Physical Therapy Evaluation  Patient Details  Name: Mary Jackson MRN: [id number] Date of Birth: [birth year] Referring Provider (PT): Britt Bottom, MD   Encounter Date: 06/23/2021   PT End of Session - 06/23/21 1211     Visit Number 1    Number of Visits 13    Date for PT Re-Evaluation 09/21/21    Authorization Type [employer] - needs auth    PT Start Time 1104    PT Stop Time 1137   pt requesting to end early to use the restroom   PT Time Calculation (min) 33 min    Equipment Utilized During Treatment Gait belt    Activity Tolerance Patient tolerated treatment well    Behavior During Therapy WFL for tasks assessed/performed             Past Medical History:  Diagnosis Date   Depression with anxiety    Migraines    MS (multiple sclerosis) ([place])     Past Surgical History:  Procedure Laterality Date   BREAST LUMPECTOMY WITH RADIOACTIVE SEED LOCALIZATION Right 06/17/2021   Procedure: RIGHT BREAST LUMPECTOMY WITH RADIOACTIVE SEED LOCALIZATION;  Surgeon: Jovita Kussmaul, MD;  Location: [facility];  Service: General;  Laterality: Right;    There were no vitals filed for this visit.    Subjective Assessment - 06/23/21 1111     Subjective Recently had a R brest lumpectomy. Was found to be benign. Reports walking/balance is ok. The entire L side of her body is hurting. No falls. Has been trying to do her PT exercises (was seen earlier this year). Got an order for an AFO last time for her RLE, but did not take it to [facility].    Patient is accompained by: Family member    Pertinent History MS, depression    Limitations Standing;Walking    Patient Stated Goals Wants to move better, not use her rollator.    Currently in Pain? Yes    Pain Score 8     Pain Location --   entire L side   Pain  Orientation Left    Pain Descriptors / Indicators Aching;Sore    Pain Type Acute pain    Aggravating Factors  Time of the month - menstrual cycle. Cold.    Pain Relieving Factors Nothing really.                [facility] PT Assessment - 06/23/21 1117       Assessment   Medical Diagnosis MS    Referring Provider (PT) Sater, Nanine Means, MD    Onset Date/Surgical Date 04/21/21    Hand Dominance Right    Prior Therapy previous PT at this location earlier this year (only came for a few visits)      Precautions   Precautions Fall      Balance Screen   Has the patient fallen in the past 6 months No    Has the patient had a decrease in activity level because of a fear of falling?  No    Is the patient reluctant to leave their home because of a fear of falling?  No      Home Environment   Living Environment Private residence    Living Arrangements Other relatives   sister   Available Help at Discharge Family;Other (Comment)    Type of Home  House    Home Access Stairs to enter    Entrance Stairs-Number of Steps 10    Entrance Stairs-Rails Right    Home Layout One level    [place] - 4 wheels;Shower Teacher, English as a foreign language chair is too big for the bathroom     Prior Function   Level of Independence Requires assistive device for independence;Independent with basic ADLs    Leisure watch TV      Sensation   Light Touch Appears Intact      Coordination   Gross Motor Movements are Fluid and Coordinated No    Fine Motor Movements are Fluid and Coordinated No    Coordination and Movement Description ataxic bilat, more impaired with LUE with overshooting    Heel Shin Test dysmetric bilat, incr difficulty with LLE due to weakness      Posture/Postural Control   Posture/Postural Control Postural limitations    Postural Limitations Flexed trunk;Forward head      Strength   Right Hip Flexion 3+/5    Left Hip Flexion 3+/5    Right Knee Flexion 4+/5    Right Knee Extension 4+/5     Left Knee Flexion 4+/5    Left Knee Extension 4+/5    Right Ankle Dorsiflexion 5/5    Left Ankle Dorsiflexion 4+/5      Transfers   Transfers Sit to Stand    Sit to Stand 5: Supervision;With upper extremity assist;From chair/3-in-1    Five time sit to stand comments  22.25 seconds with BUE support from chair      Ambulation/Gait   Ambulation/Gait Yes    Ambulation/Gait Assistance 5: Supervision    Assistive device Rollator    Gait Pattern Step-through pattern;Right foot flat;Left foot flat;Wide base of support;Abducted- right;Right circumduction;Decreased stride length;Decreased dorsiflexion - left;Decreased dorsiflexion - right;Decreased hip/knee flexion - left;Decreased hip/knee flexion - right    Ambulation Surface Level;Indoor    Gait velocity 26.15 seconds = 1.25 ft/sec    Gait Comments Pt has not yet followed up with R AFO order (was given instructions/order during last bout of therapy earlier this year)      Timed Up and Go Test   Normal TUG (seconds) 27.04   with rollator                       Objective measurements completed on examination: See above findings.                PT Education - 06/23/21 1211     Education Details Clinical findings, POC    Person(s) Educated Patient    Methods Explanation    Comprehension Verbalized understanding              PT Short Term Goals - 06/23/21 1641       PT SHORT TERM GOAL #1   Title Patient will be independent with initial HEP in order to build upon functional gains made in therapy. ALL STGS DUE 07/21/21    Time 4    Period Weeks    Status New    Target Date 07/21/21      PT SHORT TERM GOAL #2   Title Patient will decrease TUG time to 24 seconds or less in order to demo decreased fall risk.    Baseline 27.04 seconds with rollator    Time 4    Period Weeks    Status New      PT SHORT TERM GOAL #3  Title Pt will improve gait speed to at least 1.5 ft/sec in order to demo decr fall  risk/improved household mobility.    Baseline 1.25 ft/sec    Time 4    Period Weeks    Status New               PT Long Term Goals - 06/23/21 1642       PT LONG TERM GOAL #1   Title Patient will improve gait speed with rollator to at least 1.75 ft/sec in order to demo decreased fall risk. ALL LTGS DUE 08/18/21    Baseline 1.25 ft/sec with rollator    Time 8    Period Weeks    Status New    Target Date 08/18/21      PT LONG TERM GOAL #2   Title pt will be independent with final HEP in order to build upon functional gains made in therapy.    Time 8    Period Weeks    Status New      PT LONG TERM GOAL #3   Title Patient will decrease 5x sit <> stand score to 18 seconds or less from standard height chair with UE support in order to demo improved strength and decr fall risk.    Baseline 22.25 seconds with BUE support from chair    Time 8    Period Weeks    Status New      PT LONG TERM GOAL #4   Title Pt will ambulate at least 26' with rollator and supervision with R AFO in order to demo improved household mobility.    Time 8    Period Weeks    Status New                     06/23/21 1648  Plan  Clinical Impression Statement Patient is a 40 year old female referred to Neuro OPPT for evaluation for gait abnormality/imbalance due to MS. Pt's PMH is significant for: MS, depression. The following deficits were present during the exam: decreased coordination, decreased endurance, decreased BLE strength, impaired balance, gait abnormalities, postural abnormalities. Pt's TUG, gait speed, and 5x sit <> stand  scores indicate pt is at a high risk for falls.Pt would benefit from skilled PT to address these impairments and functional limitations to maximize functional mobility independence and decreasing fall risk.  Personal Factors and Comorbidities Age;Comorbidity 2;Finances;Past/Current Experience;Time since onset of injury/illness/exacerbation  Comorbidities MS,  depression  Examination-Activity Limitations Locomotion Level;Stand;Squat;Transfers  Examination-Participation Restrictions Community Activity;Cleaning;Laundry  Pt will benefit from skilled therapeutic intervention in order to improve on the following deficits Decreased activity tolerance;Abnormal gait;Decreased balance;Decreased coordination;Decreased endurance;Decreased mobility;Decreased range of motion;Difficulty walking;Decreased strength;Postural dysfunction;Impaired flexibility;Pain  Stability/Clinical Decision Making Evolving/Moderate complexity  Rehab Potential Good  PT Frequency 1x / week  PT Duration 12 weeks  PT Treatment/Interventions ADLs/Self Care Home Management;DME Instruction;Gait training;Stair training;Therapeutic activities;Functional mobility training;Therapeutic exercise;Balance training;Neuromuscular re-education;Orthotic Fit/Training;Patient/family education;Passive range of motion;Energy conservation;Vestibular;Manual techniques  PT Next Visit Plan look at AFOs for RLE, pt will need a new order. review previous HEP from last bout of therapy. Standing balance/strengthening. Try SciFit/Nustep  PT Home Exercise Plan Access Code: [email or web address]  Consulted and Agree with Plan of Care Patient     Patient will benefit from skilled therapeutic intervention in order to improve the following deficits and impairments:  Decreased activity tolerance, Abnormal gait, Decreased balance, Decreased coordination, Decreased endurance, Decreased mobility, Decreased range of motion, Difficulty walking, Decreased strength, Postural dysfunction, Impaired flexibility,  Pain  Visit Diagnosis: Difficulty in walking, not elsewhere classified  Muscle weakness (generalized)  Other symptoms and signs involving the nervous system  Other abnormalities of gait and mobility     Problem List Patient Active Problem List   Diagnosis Date Noted   Right foot drop 01/08/2021   Deficiency anemia 12/03/2020    Visit for screening mammogram 12/03/2020   Encounter for general adult medical examination with abnormal findings 12/03/2020   Cervical cancer screening 12/03/2020   Dyslipidemia, goal LDL below 160 12/03/2020   Need for vaccination 12/03/2020   Neck pain, chronic 12/03/2020   Chronic pain of right knee 12/03/2020   Secondary progressive multiple sclerosis ([place]) 04/03/2019   Vitamin D deficiency 04/03/2019   Depression 04/03/2019   Ataxic gait 04/03/2019   High risk medication use 04/03/2019   Delayed sleep phase syndrome 04/03/2019    Arliss Journey, PT, DPT  06/23/2021, 4:50 PM  [facility] [address] [place], [place], [address] Phone: [phone]   Fax:  [phone]  Name: Mary Jackson MRN: [id number] Date of Birth: [birth year]

## 2021-06-23 NOTE — Addendum Note (Signed)
Addended by: Arliss Journey on: 06/23/2021 04:51 PM   Modules accepted: Orders

## 2021-07-15 ENCOUNTER — Encounter: Payer: Self-pay | Admitting: Physical Therapy

## 2021-07-15 ENCOUNTER — Ambulatory Visit: Payer: Medicare Other | Attending: Neurology | Admitting: Physical Therapy

## 2021-07-15 ENCOUNTER — Other Ambulatory Visit: Payer: Self-pay

## 2021-07-15 DIAGNOSIS — R29818 Other symptoms and signs involving the nervous system: Secondary | ICD-10-CM | POA: Diagnosis not present

## 2021-07-15 DIAGNOSIS — R262 Difficulty in walking, not elsewhere classified: Secondary | ICD-10-CM | POA: Insufficient documentation

## 2021-07-15 DIAGNOSIS — R2689 Other abnormalities of gait and mobility: Secondary | ICD-10-CM | POA: Diagnosis not present

## 2021-07-15 DIAGNOSIS — M6281 Muscle weakness (generalized): Secondary | ICD-10-CM | POA: Insufficient documentation

## 2021-07-15 NOTE — Therapy (Signed)
[facility] [address] [place], [place], [address] Phone: [phone]   Fax:  [phone]  Physical Therapy Treatment  Patient Details  Name: Mary Jackson MRN: [id number] Date of Birth: March 06, 1981 Referring Provider (PT): Felecia Shelling, Nanine Means, MD   Encounter Date: 07/15/2021   PT End of Session - 07/15/21 1628     Visit Number 2    Number of Visits 13    Date for PT Re-Evaluation 09/21/21    Authorization Type [employer] - needs auth    PT Start Time 1625   pt needing to use restroom at start of session   PT Stop Time 1700    PT Time Calculation (min) 35 min    Equipment Utilized During Treatment Gait belt    Activity Tolerance Patient tolerated treatment well    Behavior During Therapy WFL for tasks assessed/performed             Past Medical History:  Diagnosis Date   Depression with anxiety    Migraines    MS (multiple sclerosis) ([place])     Past Surgical History:  Procedure Laterality Date   BREAST LUMPECTOMY WITH RADIOACTIVE SEED LOCALIZATION Right 06/17/2021   Procedure: RIGHT BREAST LUMPECTOMY WITH RADIOACTIVE SEED LOCALIZATION;  Surgeon: Jovita Kussmaul, MD;  Location: [facility];  Service: General;  Laterality: Right;    There were no vitals filed for this visit.   Subjective Assessment - 07/15/21 1628     Subjective No falls. Has been trying to do some of her exercises.    Patient is accompained by: Family member    Pertinent History MS, depression    Limitations Standing;Walking    Patient Stated Goals Wants to move better, not use her rollator.    Currently in Pain? No/denies                        Access Code: [email or web address] URL: [email or web address] Date: 07/15/2021 Prepared by: Janann August  Reviewed/updated HEP. See MedBridge for more details.   Exercises Small Range Straight Leg Raise - 1 x daily - 5 x weekly - 2 sets - 10  reps Supine Bridge - 1 x daily - 5 x weekly - 2 sets - 10 reps Sit to Stand with Armchair - 2 x daily - 5 x weekly - 1 sets - 10 reps Seated Heel Toe Raises - 2 x daily - 5 x weekly - 2 sets - 10 reps Seated Knee Extension with Resistance - 2 x daily - 5 x weekly - 2 sets - 10 reps - with use of red tband Clamshell - 2 x daily - 5 x weekly - 2 sets - 10 reps Seated March - 2 x daily - 5 x weekly - 1-2 sets - 10 reps       [facility] Adult PT Treatment/Exercise - 07/15/21 1646       Exercises   Exercises Other Exercises    Other Exercises  Standing at rollator: 2 x 10 reps mini squats - needing verbal and demo cues for proper technique.                     PT Education - 07/15/21 1700     Education Details Reviewed/updated HEP    Person(s) Educated Patient    Methods Explanation;Demonstration;Verbal cues;Handout    Comprehension Verbalized understanding;Returned demonstration  PT Short Term Goals - 06/23/21 1641       PT SHORT TERM GOAL #1   Title Patient will be independent with initial HEP in order to build upon functional gains made in therapy. ALL STGS DUE 07/21/21    Time 4    Period Weeks    Status New    Target Date 07/21/21      PT SHORT TERM GOAL #2   Title Patient will decrease TUG time to 24 seconds or less in order to demo decreased fall risk.    Baseline 27.04 seconds with rollator    Time 4    Period Weeks    Status New      PT SHORT TERM GOAL #3   Title Pt will improve gait speed to at least 1.5 ft/sec in order to demo decr fall risk/improved household mobility.    Baseline 1.25 ft/sec    Time 4    Period Weeks    Status New               PT Long Term Goals - 06/23/21 1642       PT LONG TERM GOAL #1   Title Patient will improve gait speed with rollator to at least 1.75 ft/sec in order to demo decreased fall risk. ALL LTGS DUE 08/18/21    Baseline 1.25 ft/sec with rollator    Time 8    Period Weeks    Status New     Target Date 08/18/21      PT LONG TERM GOAL #2   Title pt will be independent with final HEP in order to build upon functional gains made in therapy.    Time 8    Period Weeks    Status New      PT LONG TERM GOAL #3   Title Patient will decrease 5x sit <> stand score to 18 seconds or less from standard height chair with UE support in order to demo improved strength and decr fall risk.    Baseline 22.25 seconds with BUE support from chair    Time 8    Period Weeks    Status New      PT LONG TERM GOAL #4   Title Pt will ambulate at least 47' with rollator and supervision with R AFO in order to demo improved household mobility.    Time 8    Period Weeks    Status New                   Plan - 07/15/21 1701     Clinical Impression Statement Limited time during session today due to pt using restroom at start of session. Focused on reviewing and updating pt's HEP for BLE strengthening. Pt tolerated session well -needing verbal/tactile cues for technique.  Will continue to progress towards LTGs.    Personal Factors and Comorbidities Age;Comorbidity 2;Finances;Past/Current Experience;Time since onset of injury/illness/exacerbation    Comorbidities MS, depression    Examination-Activity Limitations Locomotion Level;Stand;Squat;Transfers    Examination-Participation Restrictions Community Activity;Cleaning;Laundry    Stability/Clinical Decision Making Evolving/Moderate complexity    Rehab Potential Good    PT Frequency 1x / week    PT Duration 12 weeks    PT Treatment/Interventions ADLs/Self Care Home Management;DME Instruction;Gait training;Stair training;Therapeutic activities;Functional mobility training;Therapeutic exercise;Balance training;Neuromuscular re-education;Orthotic Fit/Training;Patient/family education;Passive range of motion;Energy conservation;Vestibular;Manual techniques    PT Next Visit Plan check STGs. look at AFOs for RLE, pt will need a new order. Standing  balance/strengthening. Try  SciFit/Nustep    PT Home Exercise Plan Access Code: [email or web address]    Consulted and Agree with Plan of Care Patient             Patient will benefit from skilled therapeutic intervention in order to improve the following deficits and impairments:  Decreased activity tolerance, Abnormal gait, Decreased balance, Decreased coordination, Decreased endurance, Decreased mobility, Decreased range of motion, Difficulty walking, Decreased strength, Postural dysfunction, Impaired flexibility, Pain  Visit Diagnosis: Difficulty in walking, not elsewhere classified  Other symptoms and signs involving the nervous system  Muscle weakness (generalized)  Other abnormalities of gait and mobility     Problem List Patient Active Problem List   Diagnosis Date Noted   Right foot drop 01/08/2021   Deficiency anemia 12/03/2020   Visit for screening mammogram 12/03/2020   Encounter for general adult medical examination with abnormal findings 12/03/2020   Cervical cancer screening 12/03/2020   Dyslipidemia, goal LDL below 160 12/03/2020   Need for vaccination 12/03/2020   Neck pain, chronic 12/03/2020   Chronic pain of right knee 12/03/2020   Secondary progressive multiple sclerosis ([place]) 04/03/2019   Vitamin D deficiency 04/03/2019   Depression 04/03/2019   Ataxic gait 04/03/2019   High risk medication use 04/03/2019   Delayed sleep phase syndrome 04/03/2019    Arliss Journey, PT, DPT 07/15/2021, 5:03 PM  [facility] [address] [place], [place], [address] Phone: [phone]   Fax:  [phone]  Name: Mary Jackson MRN: [id number] Date of Birth: September 12, 1980

## 2021-07-15 NOTE — Addendum Note (Signed)
Addended by: Arliss Journey on: 07/15/2021 09:00 AM   Modules accepted: Orders

## 2021-07-15 NOTE — Patient Instructions (Signed)
Access Code: [email or web address] URL: [email or web address] Date: 07/15/2021 Prepared by: Janann August  Exercises Small Range Straight Leg Raise - 1 x daily - 5 x weekly - 2 sets - 10 reps Supine Bridge - 1 x daily - 5 x weekly - 2 sets - 10 reps Sit to Stand with Armchair - 2 x daily - 5 x weekly - 1 sets - 10 reps Seated Heel Toe Raises - 2 x daily - 5 x weekly - 2 sets - 10 reps Seated Knee Extension with Resistance - 2 x daily - 5 x weekly - 2 sets - 10 reps Clamshell - 2 x daily - 5 x weekly - 2 sets - 10 reps Seated March - 2 x daily - 5 x weekly - 1-2 sets - 10 reps

## 2021-07-21 ENCOUNTER — Other Ambulatory Visit: Payer: Self-pay

## 2021-07-21 ENCOUNTER — Ambulatory Visit (INDEPENDENT_AMBULATORY_CARE_PROVIDER_SITE_OTHER): Payer: Medicare Other | Admitting: Internal Medicine

## 2021-07-21 ENCOUNTER — Telehealth: Payer: Self-pay | Admitting: Neurology

## 2021-07-21 ENCOUNTER — Encounter: Payer: Self-pay | Admitting: Internal Medicine

## 2021-07-21 VITALS — BP 112/72 | HR 75 | Temp 98.0°F | Resp 16 | Ht 67.0 in | Wt 172.0 lb

## 2021-07-21 DIAGNOSIS — H538 Other visual disturbances: Secondary | ICD-10-CM

## 2021-07-21 DIAGNOSIS — L209 Atopic dermatitis, unspecified: Secondary | ICD-10-CM

## 2021-07-21 DIAGNOSIS — Z23 Encounter for immunization: Secondary | ICD-10-CM

## 2021-07-21 DIAGNOSIS — M159 Polyosteoarthritis, unspecified: Secondary | ICD-10-CM

## 2021-07-21 LAB — BASIC METABOLIC PANEL
BUN: 15 mg/dL (ref 6–23)
CO2: 26 mEq/L (ref 19–32)
Calcium: 9.5 mg/dL (ref 8.4–10.5)
Chloride: 106 mEq/L (ref 96–112)
Creatinine, Ser: 0.8 mg/dL (ref 0.40–1.20)
GFR: 91.12 mL/min (ref >60.00)
Glucose, Bld: 61 mg/dL — ABNORMAL LOW (ref 70–99)
Potassium: 3.6 mEq/L (ref 3.5–5.1)
Sodium: 137 mEq/L (ref 135–145)

## 2021-07-21 MED ORDER — IBUPROFEN 600 MG PO TABS: 600.0000 mg | ORAL_TABLET | Freq: Three times a day (TID) | ORAL | 0 refills | Status: DC | PRN

## 2021-07-21 MED ORDER — CLOBETASOL PROPIONATE 0.05 % EX FOAM: Freq: Two times a day (BID) | CUTANEOUS | 3 refills | Status: DC

## 2021-07-21 NOTE — Telephone Encounter (Signed)
I called patient. I spoke with Rollene Fare, sister, per DPR. I advised her that Dr. Felecia Shelling recommends that patient start [place]. Patient will come by tomorrow to sign the start form. I advised her that we will submit the order to the Infusion Suite and they will begin processing order and working on the [place]. Pt's sister verbalized understanding.

## 2021-07-21 NOTE — Telephone Encounter (Signed)
Took call from phone staff and spoke w/ pt sister, Rollene Fare (on [place]). States surgery from December showed tumor benign. She spoke with our infusion suite/Kim who told her they were not aware she was to continue Tysabri until she was able to start on Ocrevus and cx future Tysabri infusions. They were unable to schedule and told they have to re-do financial assistance w/ [employer]. She tried calling  [employer] this past Friday but unable to get in touch with someone. Advised infusion suite had not communicated this change to [place], unsure what happened as it looks like Dr. Felecia Shelling wanted her to continue this. Apologized for any miscommunication that may have occurred. Aware I will speak w/ MD and call back for further instruction.   Sister thinks pt signed start form but unable to locate in our hold folder for start forms. Advised MD may have this. If we do not have, we can send start form electronically for her to sign if need be.

## 2021-07-21 NOTE — Telephone Encounter (Signed)
Clarified with Dr Felecia Shelling and he is ok with patient starting the ocrevus. Dr Felecia Shelling is aware that the patient has been off the tysabri and recommends at this point to get authorization started for the pt for ocrevus.

## 2021-07-21 NOTE — Progress Notes (Signed)
Subjective:  Patient ID: Mary Jackson, female    DOB: 1981-01-14  Age: 41 y.o. MRN: [id number]  CC: Osteoarthritis  This visit occurred during the SARS-CoV-2 public health emergency.  Safety protocols were in place, including screening questions prior to the visit, additional usage of staff PPE, and extensive cleaning of exam room while observing appropriate contact time as indicated for disinfecting solutions.    HPI TAE ROBAK presents for f/up -  She complains of a several month history of itchy, flaky scalp.  She has chronic pain and was previously prescribed gabapentin and Lodine.  She tells me she is not taking either 1 of those.  She has been taking Motrin 600 times a day for chronic joint pain.  Outpatient Medications Prior to Visit  Medication Sig Dispense Refill   Ascorbic Acid (VITAMIN C PO) Take 1,000 mg by mouth daily.     baclofen (LIORESAL) 10 MG tablet Take 1 tablet (10 mg total) by mouth 3 (three) times daily. 270 tablet 2   Cholecalciferol (VITAMIN D3 PO) Take 5,000 Units by mouth daily.     dalfampridine 10 MG TB12 Take 1 tablet (10 mg total) by mouth 2 (two) times daily. 60 tablet 11   ferrous sulfate 324 MG TBEC Take 324 mg by mouth.     gabapentin (NEURONTIN) 300 MG capsule Take 1 capsule (300 mg total) by mouth 3 (three) times daily. 270 capsule 3   HYDROcodone-acetaminophen (NORCO/VICODIN) 5-325 MG tablet Take 1 tablet by mouth every 6 (six) hours as needed for moderate pain or severe pain. 10 tablet 0   medroxyPROGESTERone (DEPO-PROVERA) 150 MG/ML injection Inject 1 mL (150 mg total) into the muscle every 3 (three) months. 1 mL 4   natalizumab (TYSABRI) 300 MG/15ML injection Inject into the vein.     traZODone (DESYREL) 100 MG tablet      etodolac (LODINE) 400 MG tablet Take 1 tablet (400 mg total) by mouth 2 (two) times daily. 180 tablet 3   No facility-administered medications prior to visit.    ROS Review of Systems  Constitutional:  Positive for  fatigue. Negative for chills and diaphoresis.  HENT: Negative.    Eyes:  Positive for visual disturbance. Negative for photophobia.  Respiratory:  Negative for cough and shortness of breath.   Cardiovascular:  Negative for chest pain, palpitations and leg swelling.  Gastrointestinal:  Negative for abdominal pain, diarrhea and nausea.  Endocrine: Negative.   Genitourinary: Negative.  Negative for difficulty urinating.  Musculoskeletal:  Positive for arthralgias.  Skin:  Positive for rash.  Neurological:  Negative for dizziness, weakness and headaches.  Hematological:  Negative for adenopathy. Does not bruise/bleed easily.  Psychiatric/Behavioral: Negative.     Objective:  BP 112/72 (BP Location: Right Arm, Patient Position: Sitting, Cuff Size: Large)    Pulse 75    Temp 98 F (36.7 C) (Oral)    Resp 16    Ht 5\' 7"  (1.702 m)    Wt 172 lb (78 kg)    SpO2 98%    BMI 26.94 kg/m   BP Readings from Last 3 Encounters:  07/21/21 112/72  06/17/21 119/65  05/08/21 114/70    Wt Readings from Last 3 Encounters:  07/21/21 172 lb (78 kg)  06/17/21 170 lb 10.2 oz (77.4 kg)  04/21/21 172 lb 8 oz (78.2 kg)    Physical Exam Vitals reviewed.  HENT:     Nose: Nose normal.     Mouth/Throat:     Mouth:  Mucous membranes are moist.  Eyes:     General: No scleral icterus.    Conjunctiva/sclera: Conjunctivae normal.  Cardiovascular:     Rate and Rhythm: Normal rate and regular rhythm.     Heart sounds: No murmur heard. Pulmonary:     Effort: Pulmonary effort is normal.     Breath sounds: No stridor. No wheezing, rhonchi or rales.  Abdominal:     General: Abdomen is flat.     Palpations: There is no mass.     Tenderness: There is no abdominal tenderness. There is no guarding.     Hernia: No hernia is present.  Musculoskeletal:        General: Deformity (DJD) present. No swelling. Normal range of motion.     Cervical back: Neck supple.  Lymphadenopathy:     Cervical: No cervical adenopathy.   Skin:    General: Skin is warm and dry.     Comments: There is diffuse flaking of the scalp but there are no epidermal lesions and no alopecia.  Neurological:     General: No focal deficit present.  Psychiatric:        Mood and Affect: Mood normal.        Behavior: Behavior normal.    Lab Results  Component Value Date   WBC 7.8 03/24/2021   HGB 12.0 03/24/2021   HCT 37.1 03/24/2021   PLT 243 03/24/2021   GLUCOSE 61 (L) 07/21/2021   CHOL 168 12/03/2020   TRIG 51.0 12/03/2020   HDL 69.90 12/03/2020   LDLCALC 88 12/03/2020   ALT 11 04/21/2021   AST 16 04/21/2021   NA 137 07/21/2021   K 3.6 07/21/2021   CL 106 07/21/2021   CREATININE 0.80 07/21/2021   BUN 15 07/21/2021   CO2 26 07/21/2021   TSH 0.73 12/03/2020    MM Breast Surgical Specimen  Result Date: 06/17/2021 CLINICAL DATA:  Post surgical excision of a right breast lesion following radioactive seed localization. Assess specimen. EXAM: SPECIMEN RADIOGRAPH OF THE RIGHT BREAST COMPARISON:  Previous exam(s). FINDINGS: Status post excision of the right breast. The radioactive seed and biopsy marker clip are present, completely intact, and were marked for pathology. IMPRESSION: Specimen radiograph of the right breast. Electronically Signed   By: Lajean Manes M.D.   On: 06/17/2021 09:01   Assessment & Plan:   Makyah was seen today for osteoarthritis.  Diagnoses and all orders for this visit:  Atopic dermatitis of scalp -     clobetasol (OLUX) 0.05 % topical foam; Apply topically 2 (two) times daily.  Primary osteoarthritis involving multiple joints- Her renal function is stable.  Will continue ibuprofen. -     Basic metabolic panel; Future -     Basic metabolic panel -     ibuprofen (ADVIL) 600 MG tablet; Take 1 tablet (600 mg total) by mouth every 8 (eight) hours as needed for moderate pain.  Blurred vision, bilateral -     Ambulatory referral to Ophthalmology  Other orders -     Flu Vaccine QUAD 6+ mos PF IM  (Fluarix Quad PF)   I have discontinued Sofya K. Schoon's etodolac. I am also having her start on clobetasol and ibuprofen. Additionally, I am having her maintain her ferrous sulfate, Cholecalciferol (VITAMIN D3 PO), Ascorbic Acid (VITAMIN C PO), Tysabri, dalfampridine, gabapentin, baclofen, traZODone, medroxyPROGESTERone, and HYDROcodone-acetaminophen.  Meds ordered this encounter  Medications   clobetasol (OLUX) 0.05 % topical foam    Sig: Apply topically 2 (two) times  daily.    Dispense:  100 g    Refill:  3   ibuprofen (ADVIL) 600 MG tablet    Sig: Take 1 tablet (600 mg total) by mouth every 8 (eight) hours as needed for moderate pain.    Dispense:  270 tablet    Refill:  0     Follow-up: Return in about 6 months (around 01/18/2022).  Scarlette Calico, MD

## 2021-07-21 NOTE — Patient Instructions (Signed)
Osteoarthritis Osteoarthritis is a type of arthritis. It refers to joint pain or joint disease. Osteoarthritis affects tissue that covers the ends of bones in joints (cartilage). Cartilage acts as a cushion between the bones and helps them move smoothly. Osteoarthritis occurs when cartilage in the joints gets worn down. Osteoarthritis is sometimes called "wear and tear" arthritis. Osteoarthritis is the most common form of arthritis. It often occurs in older people. It is a condition that gets worse over time. The joints most often affected by this condition are in the fingers, toes, hips, knees, and spine, including the neck and lower back. What are the causes? This condition is caused by the wearing down of cartilage that covers the ends of bones. What increases the risk? The following factors may make you more likely to develop this condition: Being age 50 or older. Obesity. Overuse of joints. Past injury of a joint. Past surgery on a joint. Family history of osteoarthritis. What are the signs or symptoms? The main symptoms of this condition are pain, swelling, and stiffness in the joint. Other symptoms may include: An enlarged joint. More pain and further damage caused by small pieces of bone or cartilage that break off and float inside of the joint. Small deposits of bone (osteophytes) that grow on the edges of the joint. A grating or scraping feeling inside the joint when you move it. Popping or creaking sounds when you move. Difficulty walking or exercising. An inability to grip items, twist your hand(s), or control the movements of your hands and fingers. How is this diagnosed? This condition may be diagnosed based on: Your medical history. A physical exam. Your symptoms. X-rays of the affected joint(s). Blood tests to rule out other types of arthritis. How is this treated? There is no cure for this condition, but treatment can help control pain and improve joint function.  Treatment may include a combination of therapies, such as: Pain relief techniques, such as: Applying heat and cold to the joint. Massage. A form of talk therapy called cognitive behavioral therapy (CBT). This therapy helps you set goals and follow up on the changes that you make. Medicines for pain and inflammation. The medicines can be taken by mouth or applied to the skin. They include: NSAIDs, such as ibuprofen. Prescription medicines. Strong anti-inflammatory medicines (corticosteroids). Certain nutritional supplements. A prescribed exercise program. You may work with a physical therapist. Assistive devices, such as a brace, wrap, splint, specialized glove, or cane. A weight control plan. Surgery, such as: An osteotomy. This is done to reposition the bones and relieve pain or to remove loose pieces of bone and cartilage. Joint replacement surgery. You may need this surgery if you have advanced osteoarthritis. Follow these instructions at home: Activity Rest your affected joints as told by your health care provider. Exercise as told by your health care provider. He or she may recommend specific types of exercise, such as: Strengthening exercises. These are done to strengthen the muscles that support joints affected by arthritis. Aerobic activities. These are exercises, such as brisk walking or water aerobics, that increase your heart rate. Range-of-motion activities. These help your joints move more easily. Balance and agility exercises. Managing pain, stiffness, and swelling   If directed, apply heat to the affected area as often as told by your health care provider. Use the heat source that your health care provider recommends, such as a moist heat pack or a heating pad. If you have a removable assistive device, remove it as told by   your health care provider. Place a towel between your skin and the heat source. If your health care provider tells you to keep the assistive device on  while you apply heat, place a towel between the assistive device and the heat source. Leave the heat on for 20-30 minutes. Remove the heat if your skin turns bright red. This is especially important if you are unable to feel pain, heat, or cold. You may have a greater risk of getting burned. If directed, put ice on the affected area. To do this: If you have a removable assistive device, remove it as told by your health care provider. Put ice in a plastic bag. Place a towel between your skin and the bag. If your health care provider tells you to keep the assistive device on during icing, place a towel between the assistive device and the bag. Leave the ice on for 20 minutes, 2-3 times a day. Move your fingers or toes often to reduce stiffness and swelling. Raise (elevate) the injured area above the level of your heart while you are sitting or lying down. General instructions Take over-the-counter and prescription medicines only as told by your health care provider. Maintain a healthy weight. Follow instructions from your health care provider for weight control. Do not use any products that contain nicotine or tobacco, such as cigarettes, e-cigarettes, and chewing tobacco. If you need help quitting, ask your health care provider. Use assistive devices as told by your health care provider. Keep all follow-up visits as told by your health care provider. This is important. Where to find more information National Institute of Arthritis and Musculoskeletal and Skin Diseases: www.niams.nih.gov National Institute on Aging: www.nia.nih.gov American College of Rheumatology: www.rheumatology.org Contact a health care provider if: You have redness, swelling, or a feeling of warmth in a joint that gets worse. You have a fever along with joint or muscle aches. You develop a rash. You have trouble doing your normal activities. Get help right away if: You have pain that gets worse and is not relieved by  pain medicine. Summary Osteoarthritis is a type of arthritis that affects tissue covering the ends of bones in joints (cartilage). This condition is caused by the wearing down of cartilage that covers the ends of bones. The main symptom of this condition is pain, swelling, and stiffness in the joint. There is no cure for this condition, but treatment can help control pain and improve joint function. This information is not intended to replace advice given to you by your health care provider. Make sure you discuss any questions you have with your health care provider. Document Revised: 06/26/2019 Document Reviewed: 06/26/2019 Elsevier Patient Education  2022 [employer].  

## 2021-07-21 NOTE — Telephone Encounter (Signed)
error 

## 2021-07-22 ENCOUNTER — Encounter: Payer: Self-pay | Admitting: Physical Therapy

## 2021-07-22 ENCOUNTER — Ambulatory Visit: Payer: Medicare Other | Admitting: Physical Therapy

## 2021-07-22 DIAGNOSIS — R262 Difficulty in walking, not elsewhere classified: Secondary | ICD-10-CM

## 2021-07-22 DIAGNOSIS — R2689 Other abnormalities of gait and mobility: Secondary | ICD-10-CM | POA: Diagnosis not present

## 2021-07-22 DIAGNOSIS — M6281 Muscle weakness (generalized): Secondary | ICD-10-CM | POA: Diagnosis not present

## 2021-07-22 DIAGNOSIS — R29818 Other symptoms and signs involving the nervous system: Secondary | ICD-10-CM | POA: Diagnosis not present

## 2021-07-22 NOTE — Therapy (Signed)
[facility] [address] [place], [place], [address] Phone: [phone]   Fax:  [phone]  Physical Therapy Treatment  Patient Details  Name: Mary Jackson MRN: [id number] Date of Birth: Dec 11, 1980 Referring Provider (PT): Britt Bottom, MD   Encounter Date: 07/22/2021   PT End of Session - 07/22/21 1008     Visit Number 3    Number of Visits 13    Date for PT Re-Evaluation 09/21/21    Authorization Type UHC    PT Start Time 1005    PT Stop Time 1046    PT Time Calculation (min) 41 min    Equipment Utilized During Treatment Gait belt    Activity Tolerance Patient tolerated treatment well    Behavior During Therapy WFL for tasks assessed/performed             Past Medical History:  Diagnosis Date   Depression with anxiety    Migraines    MS (multiple sclerosis) ([place])     Past Surgical History:  Procedure Laterality Date   BREAST LUMPECTOMY WITH RADIOACTIVE SEED LOCALIZATION Right 06/17/2021   Procedure: RIGHT BREAST LUMPECTOMY WITH RADIOACTIVE SEED LOCALIZATION;  Surgeon: Jovita Kussmaul, MD;  Location: [facility];  Service: General;  Laterality: Right;    There were no vitals filed for this visit.   Subjective Assessment - 07/22/21 1007     Subjective Exercises are going well at home. No falls.    Patient is accompained by: Family member    Pertinent History MS, depression    Limitations Standing;Walking    Patient Stated Goals Wants to move better, not use her rollator.    Currently in Pain? Yes    Pain Score 6     Pain Location Foot    Pain Orientation Left    Pain Descriptors / Indicators Aching;Sore    Pain Type Acute pain    Aggravating Factors  Nothing.    Pain Relieving Factors Unsure, sitting down                [facility] PT Assessment - 07/22/21 1022       Timed Up and Go Test   Normal TUG (seconds) 21.15   with rollator                           [facility] Adult PT Treatment/Exercise - 07/22/21 1013       Ambulation/Gait   Ambulation/Gait Yes    Ambulation/Gait Assistance 5: Supervision    Ambulation Distance (Feet) --   clinic distances   Assistive device Rollator    Gait Pattern Step-through pattern;Right foot flat;Left foot flat;Wide base of support;Abducted- right;Right circumduction;Decreased stride length;Decreased dorsiflexion - left;Decreased dorsiflexion - right;Decreased hip/knee flexion - left;Decreased hip/knee flexion - right    Ambulation Surface Level;Indoor    Gait velocity 18.94 seconds = 1.73 ft/sec    Gait Comments Asked pt to wear sneakers to next visit to trial R AFO      Exercises   Exercises Knee/Hip      Knee/Hip Exercises: Aerobic   Stepper SciFit Stepper with BLE/BUE at gear 2.0 for 7 minutes for strengthening/activity tolerance. Able to keep spm >65-70      Knee/Hip Exercises: Standing   Forward Step Up Right;Left;1 set;10 reps;Step Height: 4";Hand Hold: 2;Limitations   x10 reps each leg   Forward Step Up Limitations When leading with RLE cued for incr hip/knee flexion to  clear leg, same when leading with LLE to step RLE onto step, verbal/tactile cues for knee control as pt hyperextends in standing      Knee/Hip Exercises: Supine   Bridges with Diona Foley Squeeze AROM;Strengthening;Both;1 set;10 reps   Cues for technique, full ROM, RLE more fatigued afterwards   Other Supine Knee/Hip Exercises hooklying ball squeezes x10 reps, with cues for 3 second hold      Knee/Hip Exercises: Sidelying   Hip ABduction AROM;Strengthening;Right;1 set;10 reps;Limitations    Hip ABduction Limitations With tactile,verbal cues in order to perform properly, pt fatigues easily and needs AAROM during last 4 reps                     PT Education - 07/22/21 1055     Education Details Progress towards goals    Person(s) Educated Patient    Methods Explanation    Comprehension Verbalized  understanding              PT Short Term Goals - 07/22/21 1017       PT SHORT TERM GOAL #1   Title Patient will be independent with initial HEP in order to build upon functional gains made in therapy. ALL STGS DUE 07/21/21    Baseline reviewed/updated at last session, pt reports performing consistently at home.    Time 4    Period Weeks    Status Achieved    Target Date 07/21/21      PT SHORT TERM GOAL #2   Title Patient will decrease TUG time to 24 seconds or less in order to demo decreased fall risk.    Baseline 27.04 seconds with rollator; 21.15 seconds on 07/22/21    Time 4    Period Weeks    Status Achieved      PT SHORT TERM GOAL #3   Title Pt will improve gait speed to at least 1.5 ft/sec in order to demo decr fall risk/improved household mobility.    Baseline 1.25 ft/sec, 18.94 seconds = 1.73 ft/sec    Time 4    Period Weeks    Status Achieved               PT Long Term Goals - 07/22/21 1038       PT LONG TERM GOAL #1   Title Patient will improve gait speed with rollator to at least 1.90 ft/sec in order to demo decreased fall risk. ALL LTGS DUE 08/18/21    Baseline 1.25 ft/sec with rollator; 18.94 seconds = 1.73 ft/sec on 07/22/21    Time 8    Period Weeks    Status Revised    Target Date 08/18/21      PT LONG TERM GOAL #2   Title pt will be independent with final HEP in order to build upon functional gains made in therapy.    Time 8    Period Weeks    Status New      PT LONG TERM GOAL #3   Title Patient will decrease 5x sit <> stand score to 18 seconds or less from standard height chair with UE support in order to demo improved strength and decr fall risk.    Baseline 22.25 seconds with BUE support from chair    Time 8    Period Weeks    Status New      PT LONG TERM GOAL #4   Title Pt will ambulate at least 230' with rollator and supervision with R AFO in order to  demo improved household mobility.    Time 8    Period Weeks    Status New                    Plan - 07/22/21 1056     Clinical Impression Statement Today's skilled session focused on assessing pt's STGs. Pt has met all 3 STGs. Pt improved TUG with rollator to 21.15 seconds (previously 27.04 seconds). Pt improved gait speed with rollator to 1.73 ft/sec (previously 1.25 ft/sec). Remainder of session focused on BLE strengthening. Pt's RLE fatigues more easily, needing intermittent rest breaks. Will continue to progress towards LTGs.    Personal Factors and Comorbidities Age;Comorbidity 2;Finances;Past/Current Experience;Time since onset of injury/illness/exacerbation    Comorbidities MS, depression    Examination-Activity Limitations Locomotion Level;Stand;Squat;Transfers    Examination-Participation Restrictions Community Activity;Cleaning;Laundry    Stability/Clinical Decision Making Evolving/Moderate complexity    Rehab Potential Good    PT Frequency 1x / week    PT Duration 12 weeks    PT Treatment/Interventions ADLs/Self Care Home Management;DME Instruction;Gait training;Stair training;Therapeutic activities;Functional mobility training;Therapeutic exercise;Balance training;Neuromuscular re-education;Orthotic Fit/Training;Patient/family education;Passive range of motion;Energy conservation;Vestibular;Manual techniques    PT Next Visit Plan look at AFOs for RLE (i asked pt to wear sneakers to next session to try), pt will need a new order. Standing balance/strengthening. Try SciFit/Nustep    PT Home Exercise Plan Access Code: [email or web address]    Consulted and Agree with Plan of Care Patient             Patient will benefit from skilled therapeutic intervention in order to improve the following deficits and impairments:  Decreased activity tolerance, Abnormal gait, Decreased balance, Decreased coordination, Decreased endurance, Decreased mobility, Decreased range of motion, Difficulty walking, Decreased strength, Postural dysfunction, Impaired flexibility, Pain  Visit  Diagnosis: Difficulty in walking, not elsewhere classified  Other symptoms and signs involving the nervous system  Muscle weakness (generalized)     Problem List Patient Active Problem List   Diagnosis Date Noted   Atopic dermatitis of scalp 07/21/2021   Primary osteoarthritis involving multiple joints 07/21/2021   Blurred vision, bilateral 07/21/2021   Right foot drop 01/08/2021   Visit for screening mammogram 12/03/2020   Encounter for general adult medical examination with abnormal findings 12/03/2020   Cervical cancer screening 12/03/2020   Dyslipidemia, goal LDL below 160 12/03/2020   Need for vaccination 12/03/2020   Neck pain, chronic 12/03/2020   Secondary progressive multiple sclerosis ([place]) 04/03/2019   Vitamin D deficiency 04/03/2019   Depression 04/03/2019   Ataxic gait 04/03/2019   High risk medication use 04/03/2019   Delayed sleep phase syndrome 04/03/2019    Arliss Journey, PT, DPT  07/22/2021, 10:57 AM  [facility] [address] [place], [place], [address] Phone: [phone]   Fax:  [phone]  Name: Mary Jackson MRN: [id number] Date of Birth: 04-01-1981

## 2021-07-22 NOTE — Telephone Encounter (Signed)
Patient stopped by office and signed [facility] form. Faxed to [employer]. Received a receipt of confirmation.  [facility] order, start form, clinical info given to [facility] for processing. Will continue to follow.

## 2021-07-28 ENCOUNTER — Other Ambulatory Visit: Payer: Self-pay | Admitting: Neurology

## 2021-07-28 ENCOUNTER — Other Ambulatory Visit: Payer: Self-pay

## 2021-07-28 ENCOUNTER — Ambulatory Visit: Payer: Medicare Other

## 2021-07-28 ENCOUNTER — Telehealth: Payer: Self-pay

## 2021-07-28 ENCOUNTER — Ambulatory Visit (INDEPENDENT_AMBULATORY_CARE_PROVIDER_SITE_OTHER): Payer: Medicare Other | Admitting: Gynecology

## 2021-07-28 DIAGNOSIS — R2689 Other abnormalities of gait and mobility: Secondary | ICD-10-CM

## 2021-07-28 DIAGNOSIS — Z3042 Encounter for surveillance of injectable contraceptive: Secondary | ICD-10-CM | POA: Diagnosis not present

## 2021-07-28 DIAGNOSIS — R262 Difficulty in walking, not elsewhere classified: Secondary | ICD-10-CM

## 2021-07-28 DIAGNOSIS — M6281 Muscle weakness (generalized): Secondary | ICD-10-CM | POA: Diagnosis not present

## 2021-07-28 DIAGNOSIS — R29818 Other symptoms and signs involving the nervous system: Secondary | ICD-10-CM | POA: Diagnosis not present

## 2021-07-28 DIAGNOSIS — M21371 Foot drop, right foot: Secondary | ICD-10-CM

## 2021-07-28 MED ORDER — MEDROXYPROGESTERONE ACETATE 150 MG/ML IM SUSP
150.0000 mg | Freq: Once | INTRAMUSCULAR | Status: AC
  Administered 2021-07-28: 150 mg via INTRAMUSCULAR

## 2021-07-28 NOTE — Telephone Encounter (Signed)
Dr. Felecia Shelling, Ms. Pillay is being treated by physical therapy for MS/R Foot Drop.  Ms. Bjelland will benefit from use of R AFO in order to improve safety with functional mobility.    If you agree, please submit request in EPIC under MD Order, Other Orders (list R AFO in comments) or fax to [facility] at [phone].    For insurance coverage, this patient will also need to have a face to face visit stating the need for the R AFO as soon as possible.   Thank you, Guillermina City, PT, [facility] [address] [place], [place]  [address] Phone:  [phone] Fax:  [phone]

## 2021-07-28 NOTE — Therapy (Signed)
[facility] [address] [place], [place], [address] Phone: [phone]   Fax:  [phone]  Physical Therapy Treatment  Patient Details  Name: Mary Jackson MRN: [id number] Date of Birth: 05-13-1981 Referring Provider (PT): Felecia Shelling, Nanine Means, MD   Encounter Date: 07/28/2021   PT End of Session - 07/28/21 1103     Visit Number 4    Number of Visits 13    Date for PT Re-Evaluation 09/21/21    Authorization Type UHC    PT Start Time 1101    PT Stop Time 1145    PT Time Calculation (min) 44 min    Equipment Utilized During Treatment Gait belt    Activity Tolerance Patient tolerated treatment well    Behavior During Therapy WFL for tasks assessed/performed             Past Medical History:  Diagnosis Date   Depression with anxiety    Migraines    MS (multiple sclerosis) ([place])     Past Surgical History:  Procedure Laterality Date   BREAST LUMPECTOMY WITH RADIOACTIVE SEED LOCALIZATION Right 06/17/2021   Procedure: RIGHT BREAST LUMPECTOMY WITH RADIOACTIVE SEED LOCALIZATION;  Surgeon: Jovita Kussmaul, MD;  Location: [facility];  Service: General;  Laterality: Right;    There were no vitals filed for this visit.   Subjective Assessment - 07/28/21 1103     Subjective Patient reports no new changes. Has been completing the exercises. Wore tennis shoes today so can trial braces.    Patient is accompained by: Family member    Pertinent History MS, depression    Limitations Standing;Walking    Patient Stated Goals Wants to move better, not use her rollator.    Currently in Pain? No/denies                 [facility] Adult PT Treatment/Exercise - 07/28/21 0001       Transfers   Transfers Sit to Stand;Stand to Sit    Sit to Stand 5: Supervision;With upper extremity assist;From chair/3-in-1    Stand to Sit 5: Supervision      Ambulation/Gait   Ambulation/Gait Yes    Ambulation/Gait  Assistance 5: Supervision    Ambulation/Gait Assistance Details Completed gait training with various AFO's. Trialed R Thuasne x 115 ft and R Posterior Ottobok Walk On x 115 ft. Improvement noted in foot position with R Post Ottobok Walk On with reduced supination noted. Patient reports increased comfort and no pain with R Ottobok vs. Thuasne. With AFO donned continue to have recurvatum, some improvements noted with cues to keep soft bend in knees, as well as addition of heel lift. Trialed 1/2 inch and 3/4 inch heel lift, with more improvements noted with 3/4, however increased challenge with patietn keeping shoe on due to low height. Patient interested in obtaining AFO. PT educating on process of obtaining order from MD regarding R AFO. PT to begin process.    Ambulation Distance (Feet) 115 Feet   x 4   Assistive device Rollator    Gait Pattern Step-through pattern;Right foot flat;Left foot flat;Wide base of support;Abducted- right;Right circumduction;Decreased stride length;Decreased dorsiflexion - left;Decreased dorsiflexion - right;Decreased hip/knee flexion - left;Decreased hip/knee flexion - right    Ambulation Surface Level;Indoor    Gait Comments intermittent rest breaks due to fatigue.      Neuro Re-ed    Neuro Re-ed Details  Standing with UE suport on L side, completed step forwards with LLE x  15 rep to promote weight shift/stance on RLE, primary focus on soft bend to avoid recurvatum with stance/weight shift. PT providing faciliation and cues for proper positioning of RLE to avoid ER.                     PT Education - 07/28/21 1116     Education Details Benefits of AFOs; Process of obtaining AFO    Person(s) Educated Patient    Methods Explanation    Comprehension Verbalized understanding              PT Short Term Goals - 07/22/21 1017       PT SHORT TERM GOAL #1   Title Patient will be independent with initial HEP in order to build upon functional gains made in  therapy. ALL STGS DUE 07/21/21    Baseline reviewed/updated at last session, pt reports performing consistently at home.    Time 4    Period Weeks    Status Achieved    Target Date 07/21/21      PT SHORT TERM GOAL #2   Title Patient will decrease TUG time to 24 seconds or less in order to demo decreased fall risk.    Baseline 27.04 seconds with rollator; 21.15 seconds on 07/22/21    Time 4    Period Weeks    Status Achieved      PT SHORT TERM GOAL #3   Title Pt will improve gait speed to at least 1.5 ft/sec in order to demo decr fall risk/improved household mobility.    Baseline 1.25 ft/sec, 18.94 seconds = 1.73 ft/sec    Time 4    Period Weeks    Status Achieved               PT Long Term Goals - 07/22/21 1038       PT LONG TERM GOAL #1   Title Patient will improve gait speed with rollator to at least 1.90 ft/sec in order to demo decreased fall risk. ALL LTGS DUE 08/18/21    Baseline 1.25 ft/sec with rollator; 18.94 seconds = 1.73 ft/sec on 07/22/21    Time 8    Period Weeks    Status Revised    Target Date 08/18/21      PT LONG TERM GOAL #2   Title pt will be independent with final HEP in order to build upon functional gains made in therapy.    Time 8    Period Weeks    Status New      PT LONG TERM GOAL #3   Title Patient will decrease 5x sit <> stand score to 18 seconds or less from standard height chair with UE support in order to demo improved strength and decr fall risk.    Baseline 22.25 seconds with BUE support from chair    Time 8    Period Weeks    Status New      PT LONG TERM GOAL #4   Title Pt will ambulate at least 25' with rollator and supervision with R AFO in order to demo improved household mobility.    Time 8    Period Weeks    Status New                   Plan - 07/28/21 1106     Clinical Impression Statement Today's skilled PT session focused on trial various AFOs on RLE. Most improvement noted in gait pattern w/ R posterior  Ottobok  Walk on with improved foot alignment. Pt continue to have recurvatum with improvement noted with addition of heel wedge. Pt interested in obtianing AFO for RLE, therefore PT will begin process. Will continue to progress toward LTGs.    Personal Factors and Comorbidities Age;Comorbidity 2;Finances;Past/Current Experience;Time since onset of injury/illness/exacerbation    Comorbidities MS, depression    Examination-Activity Limitations Locomotion Level;Stand;Squat;Transfers    Examination-Participation Restrictions Community Activity;Cleaning;Laundry    Stability/Clinical Decision Making Evolving/Moderate complexity    Rehab Potential Good    PT Frequency 1x / week    PT Duration 12 weeks    PT Treatment/Interventions ADLs/Self Care Home Management;DME Instruction;Gait training;Stair training;Therapeutic activities;Functional mobility training;Therapeutic exercise;Balance training;Neuromuscular re-education;Orthotic Fit/Training;Patient/family education;Passive range of motion;Energy conservation;Vestibular;Manual techniques    PT Next Visit Plan Update regarding AFO referral.  Continue gait with R Post Ottobok Walk On. Standing balance/strengthening. Try SciFit/Nustep    PT Home Exercise Plan Access Code: [email or web address]    Consulted and Agree with Plan of Care Patient             Patient will benefit from skilled therapeutic intervention in order to improve the following deficits and impairments:  Decreased activity tolerance, Abnormal gait, Decreased balance, Decreased coordination, Decreased endurance, Decreased mobility, Decreased range of motion, Difficulty walking, Decreased strength, Postural dysfunction, Impaired flexibility, Pain  Visit Diagnosis: Difficulty in walking, not elsewhere classified  Other symptoms and signs involving the nervous system  Muscle weakness (generalized)  Other abnormalities of gait and mobility     Problem List Patient Active Problem List    Diagnosis Date Noted   Atopic dermatitis of scalp 07/21/2021   Primary osteoarthritis involving multiple joints 07/21/2021   Blurred vision, bilateral 07/21/2021   Right foot drop 01/08/2021   Visit for screening mammogram 12/03/2020   Encounter for general adult medical examination with abnormal findings 12/03/2020   Cervical cancer screening 12/03/2020   Dyslipidemia, goal LDL below 160 12/03/2020   Need for vaccination 12/03/2020   Neck pain, chronic 12/03/2020   Secondary progressive multiple sclerosis ([place]) 04/03/2019   Vitamin D deficiency 04/03/2019   Depression 04/03/2019   Ataxic gait 04/03/2019   High risk medication use 04/03/2019   Delayed sleep phase syndrome 04/03/2019    Jones Bales, PT, DPT 07/28/2021, 12:06 PM  [facility] [address] [place], [place], [address] Phone: [phone]   Fax:  [phone]  Name: SEMAJA LYMON MRN: [id number] Date of Birth: 08-12-1980

## 2021-07-29 ENCOUNTER — Other Ambulatory Visit: Payer: Self-pay | Admitting: Neurology

## 2021-08-04 ENCOUNTER — Ambulatory Visit: Payer: Medicare Other

## 2021-08-04 ENCOUNTER — Other Ambulatory Visit: Payer: Self-pay

## 2021-08-04 DIAGNOSIS — R262 Difficulty in walking, not elsewhere classified: Secondary | ICD-10-CM | POA: Diagnosis not present

## 2021-08-04 DIAGNOSIS — R2689 Other abnormalities of gait and mobility: Secondary | ICD-10-CM

## 2021-08-04 DIAGNOSIS — M6281 Muscle weakness (generalized): Secondary | ICD-10-CM

## 2021-08-04 DIAGNOSIS — R29818 Other symptoms and signs involving the nervous system: Secondary | ICD-10-CM

## 2021-08-04 NOTE — Therapy (Signed)
[facility] [address] [place], [place], [address] Phone: [phone]   Fax:  [phone]  Physical Therapy Treatment  Patient Details  Name: Mary Jackson MRN: [id number] Date of Birth: 01/29/1981 Referring Provider (PT): Felecia Shelling, Nanine Means, MD   Encounter Date: 08/04/2021   PT End of Session - 08/04/21 1020     Visit Number 5    Number of Visits 13    Date for PT Re-Evaluation 09/21/21    Authorization Type U[employer]    PT Start Time 1016    PT Stop Time 1059    PT Time Calculation (min) 43 min    Equipment Utilized During Treatment Gait belt    Activity Tolerance Patient tolerated treatment well    Behavior During Therapy WFL for tasks assessed/performed             Past Medical History:  Diagnosis Date   Depression with anxiety    Migraines    MS (multiple sclerosis) ([place])     Past Surgical History:  Procedure Laterality Date   BREAST LUMPECTOMY WITH RADIOACTIVE SEED LOCALIZATION Right 06/17/2021   Procedure: RIGHT BREAST LUMPECTOMY WITH RADIOACTIVE SEED LOCALIZATION;  Surgeon: Jovita Kussmaul, MD;  Location: [facility];  Service: General;  Laterality: Right;    There were no vitals filed for this visit.   Subjective Assessment - 08/04/21 1019     Subjective No new changes. Has had some pain in the R Toe, reports she needs to go have a pedicure to get the dead skin off. No falls.    Patient is accompained by: Family member    Pertinent History MS, depression    Limitations Standing;Walking    Patient Stated Goals Wants to move better, not use her rollator.    Currently in Pain? No/denies               [facility] Adult PT Treatment/Exercise - 08/04/21 0001       Transfers   Transfers Sit to Stand;Stand to Sit    Sit to Stand 5: Supervision;With upper extremity assist;From chair/3-in-1    Stand to Sit 5: Supervision    Number of Reps 10 reps;1 set    Comments completed sit <>  stands from mat with UE support x 10 reps. Cues for forward lean and scooting forward to avoid pushing LE against mat for stabilization.      Ambulation/Gait   Ambulation/Gait Yes    Ambulation/Gait Assistance 5: Supervision    Ambulation/Gait Assistance Details gait throughout therapy session using rollator    Ambulation Distance (Feet) --   clinic distance   Assistive device Rollator    Gait Pattern Step-through pattern;Right foot flat;Left foot flat;Wide base of support;Abducted- right;Right circumduction;Decreased stride length;Decreased dorsiflexion - left;Decreased dorsiflexion - right;Decreased hip/knee flexion - left;Decreased hip/knee flexion - right    Ambulation Surface Level;Indoor      Exercises   Exercises Knee/Hip;Other Exercises    Other Exercises  at countertop with light UE support: completed mini squats with cues to sit back (like sitting in chair) vs movement forward x 10 reps.      Knee/Hip Exercises: Aerobic   Stepper SciFit Stepper with BLE/BUE at gear 2.0 for 7 minutes for strengthening/activity tolerance. Able to keep spm >65-70 and maintain conversation throughout.                 Balance Exercises - 08/04/21 0001       Balance Exercises: Standing  SLS with Vectors Solid surface;3 reps;Limitations    SLS with Vectors Limitations standing on firm surface: trialed alternating toe taps with single UE support to cones, unable to get to cone due to height. Therefore completed with 4" step, completed 2 x 10 reps. Intermittent standing rest break. Unable to complete without UE support.    Stepping Strategy Anterior;Limitations;UE support    Stepping Strategy Limitations standing on firm surface, completed alteranting steps forward/back over black balance beam x15 reps bilat, require BUE support for prpoer completion, trialed with single UE support and paitent wanting to have excessive trunk/body rotation to promote clerance.    Sidestepping Upper extremity  support;3 reps;Limitations    Sidestepping Limitations sidestepping at countertop x 3 laps down and back, cues to keep feet/hip alignment and avoid ER. UE support used.                  PT Short Term Goals - 07/22/21 1017       PT SHORT TERM GOAL #1   Title Patient will be independent with initial HEP in order to build upon functional gains made in therapy. ALL STGS DUE 07/21/21    Baseline reviewed/updated at last session, pt reports performing consistently at home.    Time 4    Period Weeks    Status Achieved    Target Date 07/21/21      PT SHORT TERM GOAL #2   Title Patient will decrease TUG time to 24 seconds or less in order to demo decreased fall risk.    Baseline 27.04 seconds with rollator; 21.15 seconds on 07/22/21    Time 4    Period Weeks    Status Achieved      PT SHORT TERM GOAL #3   Title Pt will improve gait speed to at least 1.5 ft/sec in order to demo decr fall risk/improved household mobility.    Baseline 1.25 ft/sec, 18.94 seconds = 1.73 ft/sec    Time 4    Period Weeks    Status Achieved               PT Long Term Goals - 07/22/21 1038       PT LONG TERM GOAL #1   Title Patient will improve gait speed with rollator to at least 1.90 ft/sec in order to demo decreased fall risk. ALL LTGS DUE 08/18/21    Baseline 1.25 ft/sec with rollator; 18.94 seconds = 1.73 ft/sec on 07/22/21    Time 8    Period Weeks    Status Revised    Target Date 08/18/21      PT LONG TERM GOAL #2   Title pt will be independent with final HEP in order to build upon functional gains made in therapy.    Time 8    Period Weeks    Status New      PT LONG TERM GOAL #3   Title Patient will decrease 5x sit <> stand score to 18 seconds or less from standard height chair with UE support in order to demo improved strength and decr fall risk.    Baseline 22.25 seconds with BUE support from chair    Time 8    Period Weeks    Status New      PT LONG TERM GOAL #4   Title Pt  will ambulate at least 13' with rollator and supervision with R AFO in order to demo improved household mobility.    Time 8    Period Weeks  Status New                   Plan - 08/04/21 1030     Clinical Impression Statement As of today's session order for AFO not placed, and no MD face to face visit. Will follow up regarding this. Rest of session spent focused on BLE strengthening and balance activities. Most challenge noted with. Will continue to progress toward all LTGs.    Personal Factors and Comorbidities Age;Comorbidity 2;Finances;Past/Current Experience;Time since onset of injury/illness/exacerbation    Comorbidities MS, depression    Examination-Activity Limitations Locomotion Level;Stand;Squat;Transfers    Examination-Participation Restrictions Community Activity;Cleaning;Laundry    Stability/Clinical Decision Making Evolving/Moderate complexity    Rehab Potential Good    PT Frequency 1x / week    PT Duration 12 weeks    PT Treatment/Interventions ADLs/Self Care Home Management;DME Instruction;Gait training;Stair training;Therapeutic activities;Functional mobility training;Therapeutic exercise;Balance training;Neuromuscular re-education;Orthotic Fit/Training;Patient/family education;Passive range of motion;Energy conservation;Vestibular;Manual techniques    PT Next Visit Plan Update regarding AFO referral.  Continue gait with R Post Ottobok Walk On. Standing balance/strengthening. Try SciFit/Nustep    PT Home Exercise Plan Access Code: [email or web address]    Consulted and Agree with Plan of Care Patient             Patient will benefit from skilled therapeutic intervention in order to improve the following deficits and impairments:  Decreased activity tolerance, Abnormal gait, Decreased balance, Decreased coordination, Decreased endurance, Decreased mobility, Decreased range of motion, Difficulty walking, Decreased strength, Postural dysfunction, Impaired flexibility,  Pain  Visit Diagnosis: Difficulty in walking, not elsewhere classified  Other symptoms and signs involving the nervous system  Muscle weakness (generalized)  Other abnormalities of gait and mobility     Problem List Patient Active Problem List   Diagnosis Date Noted   Atopic dermatitis of scalp 07/21/2021   Primary osteoarthritis involving multiple joints 07/21/2021   Blurred vision, bilateral 07/21/2021   Right foot drop 01/08/2021   Visit for screening mammogram 12/03/2020   Encounter for general adult medical examination with abnormal findings 12/03/2020   Cervical cancer screening 12/03/2020   Dyslipidemia, goal LDL below 160 12/03/2020   Need for vaccination 12/03/2020   Neck pain, chronic 12/03/2020   Secondary progressive multiple sclerosis ([place]) 04/03/2019   Vitamin D deficiency 04/03/2019   Depression 04/03/2019   Ataxic gait 04/03/2019   High risk medication use 04/03/2019   Delayed sleep phase syndrome 04/03/2019    Jones Bales, PT, DPT 08/04/2021, 11:02 AM  [facility] [address] [place], [place], [address] Phone: [phone]   Fax:  [phone]  Name: RAMESHA POSTER MRN: [id number] Date of Birth: 11/02/1980

## 2021-08-11 ENCOUNTER — Ambulatory Visit: Payer: Medicare Other | Admitting: Physical Therapy

## 2021-08-11 ENCOUNTER — Other Ambulatory Visit: Payer: Self-pay

## 2021-08-11 DIAGNOSIS — R2689 Other abnormalities of gait and mobility: Secondary | ICD-10-CM | POA: Diagnosis not present

## 2021-08-11 DIAGNOSIS — M6281 Muscle weakness (generalized): Secondary | ICD-10-CM | POA: Diagnosis not present

## 2021-08-11 DIAGNOSIS — R262 Difficulty in walking, not elsewhere classified: Secondary | ICD-10-CM | POA: Diagnosis not present

## 2021-08-11 DIAGNOSIS — R29818 Other symptoms and signs involving the nervous system: Secondary | ICD-10-CM | POA: Diagnosis not present

## 2021-08-11 NOTE — Therapy (Signed)
[facility] [address] [place], [place], [address] Phone: [phone]   Fax:  [phone]  Physical Therapy Treatment  Patient Details  Name: Mary Jackson MRN: [id number] Date of Birth: [birth year] Referring Provider (PT): Britt Bottom, MD   Encounter Date: 08/11/2021   PT End of Session - 08/11/21 1215     Visit Number 6    Number of Visits 13    Date for PT Re-Evaluation 09/21/21    Authorization Type UHC    PT Start Time 1018    PT Stop Time 1106    PT Time Calculation (min) 48 min    Equipment Utilized During Treatment Gait belt    Activity Tolerance Patient tolerated treatment well    Behavior During Therapy WFL for tasks assessed/performed             Past Medical History:  Diagnosis Date   Depression with anxiety    Migraines    MS (multiple sclerosis) ([place])     Past Surgical History:  Procedure Laterality Date   BREAST LUMPECTOMY WITH RADIOACTIVE SEED LOCALIZATION Right 06/17/2021   Procedure: RIGHT BREAST LUMPECTOMY WITH RADIOACTIVE SEED LOCALIZATION;  Surgeon: Jovita Kussmaul, MD;  Location: [facility];  Service: General;  Laterality: Right;    There were no vitals filed for this visit.   Subjective Assessment - 08/11/21 1021     Subjective No changes. Feb 13 - pt has new treatments for MS    Patient is accompained by: Family member    Pertinent History MS, depression    Limitations Standing;Walking    Patient Stated Goals Wants to move better, not use her rollator.    Currently in Pain? No/denies                               [facility] Adult PT Treatment/Exercise - 08/11/21 0001       Ambulation/Gait   Ambulation/Gait Yes    Ambulation/Gait Assistance 5: Supervision    Ambulation/Gait Assistance Details Working on controlling speed and increasing endurance with gait.    Ambulation Distance (Feet) 200 Feet    Assistive device Other (Comment)    R AFO   Gait Pattern Step-through pattern;Right foot flat;Left foot flat;Wide base of support;Abducted- right;Right circumduction;Decreased stride length;Decreased dorsiflexion - left;Decreased dorsiflexion - right;Decreased hip/knee flexion - left;Decreased hip/knee flexion - right    Ambulation Surface Level;Indoor      Knee/Hip Exercises: Aerobic   Stepper SciFit Stepper with BLE/BUE at gear 2.0 for 8 minutes for strengthening/activity tolerance. Able to keep spm >70- 90 and maintain conversation throughout.                 Balance Exercises - 08/11/21 0001       Balance Exercises: Standing   Sidestepping Other reps (comment)   side stepping along mat table, 3 steps to the R then L holding 2# ball at chest level cues for increasing foot clearance.   Sit to Stand Standard surface   working on decreasing UE support with light Left UE support.   Lift / Chop Both;Other (comment)   standing balance and core strengthening holding 2# ball: chest press x10, chest to UE shoulder extension x10,   Other Standing Exercises mini squats without UE support holding 2 # ball.                PT Education - 08/11/21 1215  Education Details Reminder to make an appointment with Neurologist for AFO process.    Person(s) Educated Patient    Methods Explanation;Handout    Comprehension Verbalized understanding              PT Short Term Goals - 07/22/21 1017       PT SHORT TERM GOAL #1   Title Patient will be independent with initial HEP in order to build upon functional gains made in therapy. ALL STGS DUE 07/21/21    Baseline reviewed/updated at last session, pt reports performing consistently at home.    Time 4    Period Weeks    Status Achieved    Target Date 07/21/21      PT SHORT TERM GOAL #2   Title Patient will decrease TUG time to 24 seconds or less in order to demo decreased fall risk.    Baseline 27.04 seconds with rollator; 21.15 seconds on 07/22/21    Time 4     Period Weeks    Status Achieved      PT SHORT TERM GOAL #3   Title Pt will improve gait speed to at least 1.5 ft/sec in order to demo decr fall risk/improved household mobility.    Baseline 1.25 ft/sec, 18.94 seconds = 1.73 ft/sec    Time 4    Period Weeks    Status Achieved               PT Long Term Goals - 07/22/21 1038       PT LONG TERM GOAL #1   Title Patient will improve gait speed with rollator to at least 1.90 ft/sec in order to demo decreased fall risk. ALL LTGS DUE 08/18/21    Baseline 1.25 ft/sec with rollator; 18.94 seconds = 1.73 ft/sec on 07/22/21    Time 8    Period Weeks    Status Revised    Target Date 08/18/21      PT LONG TERM GOAL #2   Title pt will be independent with final HEP in order to build upon functional gains made in therapy.    Time 8    Period Weeks    Status New      PT LONG TERM GOAL #3   Title Patient will decrease 5x sit <> stand score to 18 seconds or less from standard height chair with UE support in order to demo improved strength and decr fall risk.    Baseline 22.25 seconds with BUE support from chair    Time 8    Period Weeks    Status New      PT LONG TERM GOAL #4   Title Pt will ambulate at least 13' with rollator and supervision with R AFO in order to demo improved household mobility.    Time 8    Period Weeks    Status New                   Plan - 08/11/21 1219     Clinical Impression Statement Pt progressing with treatment working on dynamic standing balance including stepping without UE support and with holding weight; pt required supervision with cues for posture, core activation, and body mechanics.    Personal Factors and Comorbidities Age;Comorbidity 2;Finances;Past/Current Experience;Time since onset of injury/illness/exacerbation    Comorbidities MS, depression    Examination-Activity Limitations Locomotion Level;Stand;Squat;Transfers    Examination-Participation Restrictions Community  Activity;Cleaning;Laundry    Stability/Clinical Decision Making Evolving/Moderate complexity    Rehab Potential Good  PT Frequency 1x / week    PT Duration 12 weeks    PT Treatment/Interventions ADLs/Self Care Home Management;DME Instruction;Gait training;Stair training;Therapeutic activities;Functional mobility training;Therapeutic exercise;Balance training;Neuromuscular re-education;Orthotic Fit/Training;Patient/family education;Passive range of motion;Energy conservation;Vestibular;Manual techniques    PT Next Visit Plan check on follow up with MD about AFO.  Continue gait with R Post Ottobok Walk On. Standing balance/strengthening. Try SciFit/Nustep    PT Home Exercise Plan Access Code: [email or web address]    Consulted and Agree with Plan of Care Patient             Patient will benefit from skilled therapeutic intervention in order to improve the following deficits and impairments:  Decreased activity tolerance, Abnormal gait, Decreased balance, Decreased coordination, Decreased endurance, Decreased mobility, Decreased range of motion, Difficulty walking, Decreased strength, Postural dysfunction, Impaired flexibility, Pain  Visit Diagnosis: Difficulty in walking, not elsewhere classified  Muscle weakness (generalized)  Other abnormalities of gait and mobility     Problem List Patient Active Problem List   Diagnosis Date Noted   Atopic dermatitis of scalp 07/21/2021   Primary osteoarthritis involving multiple joints 07/21/2021   Blurred vision, bilateral 07/21/2021   Right foot drop 01/08/2021   Visit for screening mammogram 12/03/2020   Encounter for general adult medical examination with abnormal findings 12/03/2020   Cervical cancer screening 12/03/2020   Dyslipidemia, goal LDL below 160 12/03/2020   Need for vaccination 12/03/2020   Neck pain, chronic 12/03/2020   Secondary progressive multiple sclerosis ([place]) 04/03/2019   Vitamin D deficiency 04/03/2019   Depression  04/03/2019   Ataxic gait 04/03/2019   High risk medication use 04/03/2019   Delayed sleep phase syndrome 04/03/2019    Bjorn Loser, PTA 08/11/2021, 12:22 PM  [facility] [address] [place], [place], [address] Phone: [phone]   Fax:  [phone]  Name: Mary Jackson MRN: [id number] Date of Birth: [birth year]

## 2021-08-11 NOTE — Therapy (Signed)
[facility] [address] [place], [place], [address] Phone: [phone]   Fax:  [phone]  Physical Therapy Treatment  Patient Details  Name: Mary Jackson MRN: [id number] Date of Birth: 30-Aug-1980 Referring Provider (PT): Britt Bottom, MD   Encounter Date: 08/11/2021   PT End of Session - 08/11/21 1215     Visit Number 6    Number of Visits 13    Date for PT Re-Evaluation 09/21/21    Authorization Type UHC    PT Start Time 1018    PT Stop Time 1106    PT Time Calculation (min) 48 min    Equipment Utilized During Treatment Gait belt    Activity Tolerance Patient tolerated treatment well    Behavior During Therapy WFL for tasks assessed/performed             Past Medical History:  Diagnosis Date   Depression with anxiety    Migraines    MS (multiple sclerosis) ([place])     Past Surgical History:  Procedure Laterality Date   BREAST LUMPECTOMY WITH RADIOACTIVE SEED LOCALIZATION Right 06/17/2021   Procedure: RIGHT BREAST LUMPECTOMY WITH RADIOACTIVE SEED LOCALIZATION;  Surgeon: Jovita Kussmaul, MD;  Location: [facility];  Service: General;  Laterality: Right;    There were no vitals filed for this visit.   Subjective Assessment - 08/11/21 1021     Subjective No changes. Feb 13 - pt has new treatments for MS    Patient is accompained by: Family member    Pertinent History MS, depression    Limitations Standing;Walking    Patient Stated Goals Wants to move better, not use her rollator.    Currently in Pain? No/denies                               [facility] Adult PT Treatment/Exercise - 08/11/21 0001       Ambulation/Gait   Ambulation/Gait Yes    Ambulation/Gait Assistance 5: Supervision    Ambulation/Gait Assistance Details Working on controlling speed and increasing endurance with gait.    Ambulation Distance (Feet) 200 Feet    Assistive device Other (Comment)    R AFO  [place] walk on   Gait Pattern Step-through pattern;Right foot flat;Left foot flat;Wide base of support;Abducted- right;Right circumduction;Decreased stride length;Decreased dorsiflexion - left;Decreased dorsiflexion - right;Decreased hip/knee flexion - left;Decreased hip/knee flexion - right    Ambulation Surface Level;Indoor      Knee/Hip Exercises: Aerobic   Stepper SciFit Stepper with BLE/BUE at gear 2.0 for 8 minutes for strengthening/activity tolerance. Able to keep spm >70- 90 and maintain conversation throughout.                 Balance Exercises - 08/11/21 0001       Balance Exercises: Standing   Sidestepping Other reps (comment)   side stepping along mat table, 3 steps to the R then L holding 2# ball at chest level cues for increasing foot clearance.   Sit to Stand Standard surface   working on decreasing UE support with light Left UE support.   Lift / Chop Both;Other (comment)   standing balance and core strengthening holding 2# ball: chest press x10, chest to UE shoulder extension x10,   Other Standing Exercises mini squats without UE support holding 2 # ball.                PT Education -  08/11/21 1215     Education Details Reminder to make an appointment with Neurologist for AFO process.    Person(s) Educated Patient    Methods Explanation;Handout    Comprehension Verbalized understanding              PT Short Term Goals - 07/22/21 1017       PT SHORT TERM GOAL #1   Title Patient will be independent with initial HEP in order to build upon functional gains made in therapy. ALL STGS DUE 07/21/21    Baseline reviewed/updated at last session, pt reports performing consistently at home.    Time 4    Period Weeks    Status Achieved    Target Date 07/21/21      PT SHORT TERM GOAL #2   Title Patient will decrease TUG time to 24 seconds or less in order to demo decreased fall risk.    Baseline 27.04 seconds with rollator; 21.15 seconds on 07/22/21     Time 4    Period Weeks    Status Achieved      PT SHORT TERM GOAL #3   Title Pt will improve gait speed to at least 1.5 ft/sec in order to demo decr fall risk/improved household mobility.    Baseline 1.25 ft/sec, 18.94 seconds = 1.73 ft/sec    Time 4    Period Weeks    Status Achieved               PT Long Term Goals - 07/22/21 1038       PT LONG TERM GOAL #1   Title Patient will improve gait speed with rollator to at least 1.90 ft/sec in order to demo decreased fall risk. ALL LTGS DUE 08/18/21    Baseline 1.25 ft/sec with rollator; 18.94 seconds = 1.73 ft/sec on 07/22/21    Time 8    Period Weeks    Status Revised    Target Date 08/18/21      PT LONG TERM GOAL #2   Title pt will be independent with final HEP in order to build upon functional gains made in therapy.    Time 8    Period Weeks    Status New      PT LONG TERM GOAL #3   Title Patient will decrease 5x sit <> stand score to 18 seconds or less from standard height chair with UE support in order to demo improved strength and decr fall risk.    Baseline 22.25 seconds with BUE support from chair    Time 8    Period Weeks    Status New      PT LONG TERM GOAL #4   Title Pt will ambulate at least 46' with rollator and supervision with R AFO in order to demo improved household mobility.    Time 8    Period Weeks    Status New                   Plan - 08/11/21 1219     Clinical Impression Statement Pt progressing with treatment working on dynamic standing balance including stepping without UE support and with holding weight; pt required supervision with cues for posture, core activation, and body mechanics.    Personal Factors and Comorbidities Age;Comorbidity 2;Finances;Past/Current Experience;Time since onset of injury/illness/exacerbation    Comorbidities MS, depression    Examination-Activity Limitations Locomotion Level;Stand;Squat;Transfers    Examination-Participation Restrictions Community  Activity;Cleaning;Laundry    Stability/Clinical Decision Making Evolving/Moderate complexity  Rehab Potential Good    PT Frequency 1x / week    PT Duration 12 weeks    PT Treatment/Interventions ADLs/Self Care Home Management;DME Instruction;Gait training;Stair training;Therapeutic activities;Functional mobility training;Therapeutic exercise;Balance training;Neuromuscular re-education;Orthotic Fit/Training;Patient/family education;Passive range of motion;Energy conservation;Vestibular;Manual techniques    PT Next Visit Plan check on follow up with MD about AFO.  Continue gait with R Post Ottobok Walk On. Standing balance/strengthening. Try SciFit/Nustep    PT Home Exercise Plan Access Code: [email or web address]    Consulted and Agree with Plan of Care Patient             Patient will benefit from skilled therapeutic intervention in order to improve the following deficits and impairments:  Decreased activity tolerance, Abnormal gait, Decreased balance, Decreased coordination, Decreased endurance, Decreased mobility, Decreased range of motion, Difficulty walking, Decreased strength, Postural dysfunction, Impaired flexibility, Pain  Visit Diagnosis: Difficulty in walking, not elsewhere classified  Muscle weakness (generalized)  Other abnormalities of gait and mobility     Problem List Patient Active Problem List   Diagnosis Date Noted   Atopic dermatitis of scalp 07/21/2021   Primary osteoarthritis involving multiple joints 07/21/2021   Blurred vision, bilateral 07/21/2021   Right foot drop 01/08/2021   Visit for screening mammogram 12/03/2020   Encounter for general adult medical examination with abnormal findings 12/03/2020   Cervical cancer screening 12/03/2020   Dyslipidemia, goal LDL below 160 12/03/2020   Need for vaccination 12/03/2020   Neck pain, chronic 12/03/2020   Secondary progressive multiple sclerosis ([place]) 04/03/2019   Vitamin D deficiency 04/03/2019   Depression  04/03/2019   Ataxic gait 04/03/2019   High risk medication use 04/03/2019   Delayed sleep phase syndrome 04/03/2019    Bjorn Loser, PTA  08/11/21, 12:23 PM   [facility] [address] [place], [place], [address] Phone: [phone]   Fax:  [phone]  Name: Mary Jackson MRN: [id number] Date of Birth: 1981/06/09

## 2021-08-13 ENCOUNTER — Encounter: Payer: Self-pay | Admitting: Neurology

## 2021-08-18 ENCOUNTER — Ambulatory Visit: Payer: Medicare Other

## 2021-08-25 ENCOUNTER — Ambulatory Visit: Payer: Medicare HMO | Admitting: Physical Therapy

## 2021-08-26 ENCOUNTER — Other Ambulatory Visit: Payer: Self-pay

## 2021-08-26 ENCOUNTER — Ambulatory Visit: Payer: Medicare Other | Attending: Internal Medicine | Admitting: Physical Therapy

## 2021-08-26 ENCOUNTER — Encounter: Payer: Self-pay | Admitting: Physical Therapy

## 2021-08-26 DIAGNOSIS — R262 Difficulty in walking, not elsewhere classified: Secondary | ICD-10-CM | POA: Insufficient documentation

## 2021-08-26 DIAGNOSIS — R29818 Other symptoms and signs involving the nervous system: Secondary | ICD-10-CM | POA: Diagnosis not present

## 2021-08-26 DIAGNOSIS — M6281 Muscle weakness (generalized): Secondary | ICD-10-CM | POA: Diagnosis not present

## 2021-08-26 DIAGNOSIS — R2689 Other abnormalities of gait and mobility: Secondary | ICD-10-CM | POA: Diagnosis not present

## 2021-08-26 NOTE — Therapy (Addendum)
[facility] [address] [place], [place], [address] Phone: [phone]   Fax:  [phone]  Physical Therapy Treatment  Patient Details  Name: Mary Jackson MRN: [id number] Date of Birth: 22-Aug-1980 Referring Provider (PT): Felecia Shelling, Nanine Means, MD   Encounter Date: 08/26/2021   PT End of Session - 08/26/21 0001     Visit Number 7    Number of Visits 13    Date for PT Re-Evaluation 09/21/21    Authorization Type UHC    PT Start Time 0847    PT Stop Time 0927    PT Time Calculation (min) 40 min    Equipment Utilized During Treatment Gait belt    Activity Tolerance Patient tolerated treatment well    Behavior During Therapy WFL for tasks assessed/performed             Past Medical History:  Diagnosis Date   Depression with anxiety    Migraines    MS (multiple sclerosis) ([place])     Past Surgical History:  Procedure Laterality Date   BREAST LUMPECTOMY WITH RADIOACTIVE SEED LOCALIZATION Right 06/17/2021   Procedure: RIGHT BREAST LUMPECTOMY WITH RADIOACTIVE SEED LOCALIZATION;  Surgeon: Jovita Kussmaul, MD;  Location: [facility];  Service: General;  Laterality: Right;    There were no vitals filed for this visit.   Subjective Assessment - 08/26/21 0001     Subjective Wasn't able to get her infusions yesterday for MS. Reports her sister has not yet made an appt with Dr. Felecia Shelling for the face to face visit for her AFO.    Pertinent History MS, depression    Limitations Standing;Walking    Patient Stated Goals Wants to move better, not use her rollator.                [facility] PT Assessment - 08/26/21 0859       Standardized Balance Assessment   Standardized Balance Assessment Berg Balance Test      Berg Balance Test   Sit to Stand Able to stand  independently using hands    Standing Unsupported Able to stand safely 2 minutes    Sitting with Back Unsupported but Feet Supported on Floor or  Stool Able to sit safely and securely 2 minutes    Stand to Sit Sits safely with minimal use of hands    Transfers Able to transfer safely, definite need of hands    Standing Unsupported with Eyes Closed Able to stand 10 seconds safely    Standing Unsupported with Feet Together Able to place feet together independently and stand for 1 minute with supervision   unable to get feet fully together   From Standing, Reach Forward with Outstretched Arm Can reach confidently >25 cm (10")    From Standing Position, Pick up Object from Floor Able to pick up shoe, needs supervision    From Standing Position, Turn to Look Behind Over each Shoulder Looks behind from both sides and weight shifts well    Turn 360 Degrees Needs close supervision or verbal cueing   24 seconds to L, 18 seconds to R   Standing Unsupported, Alternately Place Feet on Step/Stool Needs assistance to keep from falling or unable to try    Standing Unsupported, One Foot in Front Able to take small step independently and hold 30 seconds    Standing on One Leg Unable to try or needs assist to prevent fall    Total Score 39  Berg comment: 34/28 = signifcant fall risk                           [facility] Adult PT Treatment/Exercise - 08/26/21 0859       Transfers   Transfers Sit to Stand;Stand to Sit    Sit to Stand 5: Supervision;With upper extremity assist;From chair/3-in-1    Five time sit to stand comments  15.97 seconds with BUE support from chair    Stand to Sit 5: Supervision    Number of Reps 2 sets   5 reps     Ambulation/Gait   Ambulation/Gait Yes    Ambulation/Gait Assistance 5: Supervision    Assistive device Rollator    Gait Pattern Step-through pattern;Decreased step length - left;Decreased step length - right;Decreased stance time - left;Decreased stance time - right;Decreased hip/knee flexion - right;Decreased hip/knee flexion - left;Decreased dorsiflexion - right;Decreased dorsiflexion - left;Wide base  of support;Poor foot clearance - left;Poor foot clearance - right;Abducted- right    Ambulation Surface Level;Indoor    Gait velocity 15.38 seconds = 2.13 ft/sec                       PT Short Term Goals - 07/22/21 1017       PT SHORT TERM GOAL #1   Title Patient will be independent with initial HEP in order to build upon functional gains made in therapy. ALL STGS DUE 07/21/21    Baseline reviewed/updated at last session, pt reports performing consistently at home.    Time 4    Period Weeks    Status Achieved    Target Date 07/21/21      PT SHORT TERM GOAL #2   Title Patient will decrease TUG time to 24 seconds or less in order to demo decreased fall risk.    Baseline 27.04 seconds with rollator; 21.15 seconds on 07/22/21    Time 4    Period Weeks    Status Achieved      PT SHORT TERM GOAL #3   Title Pt will improve gait speed to at least 1.5 ft/sec in order to demo decr fall risk/improved household mobility.    Baseline 1.25 ft/sec, 18.94 seconds = 1.73 ft/sec    Time 4    Period Weeks    Status Achieved               PT Long Term Goals - 08/26/21 7681       PT LONG TERM GOAL #1   Title Patient will improve gait speed with rollator to at least 1.90 ft/sec in order to demo decreased fall risk. ALL LTGS DUE 08/18/21    Baseline 1.25 ft/sec with rollator; 18.94 seconds = 1.73 ft/sec on 07/22/21, 15.38 seconds = 2.13 ft/sec on 08/26/21    Time 8    Period Weeks    Status Achieved    Target Date 08/18/21      PT LONG TERM GOAL #2   Title pt will be independent with final HEP in order to build upon functional gains made in therapy.    Baseline pt will benefit from updates/review    Time 8    Period Weeks    Status On-going      PT LONG TERM GOAL #3   Title Patient will decrease 5x sit <> stand score to 18 seconds or less from standard height chair with UE support in order to demo improved strength  and decr fall risk.    Baseline 22.25 seconds with BUE  support from chair, 15.97 seconds on 08/26/21    Time 8    Period Weeks    Status Achieved      PT LONG TERM GOAL #4   Title Pt will ambulate at least 75' with rollator and supervision with R AFO in order to demo improved household mobility.    Baseline pt has not yet received her AFO    Time 8    Period Weeks    Status On-going            Updated/ongoing LTGs for 12 week POC:      PT Long Term Goals - 08/26/21 1658       PT LONG TERM GOAL #1   Title Patient will improve gait speed with rollator to at least 1.90 ft/sec in order to demo decreased fall risk. ALL LTGS DUE 09/21/21    Baseline 1.25 ft/sec with rollator; 18.94 seconds = 1.73 ft/sec on 07/22/21, 15.38 seconds = 2.13 ft/sec on 08/26/21    Time 12    Period Weeks    Status Achieved    Target Date 09/21/21      PT LONG TERM GOAL #2   Title pt will be independent with final HEP in order to build upon functional gains made in therapy.    Baseline pt will benefit from updates/review    Time 12    Period Weeks    Status On-going      PT LONG TERM GOAL #3   Title Patient will decrease 5x sit <> stand score to 18 seconds or less from standard height chair with UE support in order to demo improved strength and decr fall risk.    Baseline 22.25 seconds with BUE support from chair, 15.97 seconds on 08/26/21    Time 12    Period Weeks    Status Achieved      PT LONG TERM GOAL #4   Title Pt will ambulate at least 230' with rollator and supervision with R AFO in order to demo improved household mobility.    Baseline pt has not yet received her AFO    Time 12    Period Weeks    Status On-going    Target Date 09/23/21      PT LONG TERM GOAL #5   Title Pt will improve BERG to at least a 43/56 in order to demo decr fall risk.    Baseline 39/56    Time 12    Period Weeks    Status New                Plan - 08/26/21 1653     Clinical Impression Statement Today's skilled session focused on assessing pt's LTGs.  Pt met LTG #1 and #3. Improved gait speed to 2.13 ft/sec with rollator (previously 1.73 ft/sec) and improved 5x sit <> stand with BUE support to 15.97 seconds (previously was 22.25 seconds). Pt's sister has not yet made face to face visit with neurologist for R AFO for insurance purpsoses. Remainder of LTGs are ongoing. Checked BERG today with pt scoring a 39/56, indicating a significant fall risk. LTGs updated/ongoing as appropriate for 12 week POC.    Personal Factors and Comorbidities Age;Comorbidity 2;Finances;Past/Current Experience;Time since onset of injury/illness/exacerbation    Comorbidities MS, depression    Examination-Activity Limitations Locomotion Level;Stand;Squat;Transfers    Examination-Participation Restrictions Community Activity;Cleaning;Laundry    Stability/Clinical Decision Making Evolving/Moderate complexity  Rehab Potential Good    PT Frequency 1x / week    PT Duration 12 weeks    PT Treatment/Interventions ADLs/Self Care Home Management;DME Instruction;Gait training;Stair training;Therapeutic activities;Functional mobility training;Therapeutic exercise;Balance training;Neuromuscular re-education;Orthotic Fit/Training;Patient/family education;Passive range of motion;Energy conservation;Vestibular;Manual techniques    PT Next Visit Plan check on follow up with MD about AFO.  Continue gait with R Post Ottobok Walk On. Standing balance/strengthening. SciFit/Nustep. Will D/C at end of scheduled visits.    PT Home Exercise Plan Access Code: [email or web address]    Consulted and Agree with Plan of Care Patient             Patient will benefit from skilled therapeutic intervention in order to improve the following deficits and impairments:  Decreased activity tolerance, Abnormal gait, Decreased balance, Decreased coordination, Decreased endurance, Decreased mobility, Decreased range of motion, Difficulty walking, Decreased strength, Postural dysfunction, Impaired flexibility,  Pain  Visit Diagnosis: Difficulty in walking, not elsewhere classified  Muscle weakness (generalized)  Other abnormalities of gait and mobility  Other symptoms and signs involving the nervous system     Problem List Patient Active Problem List   Diagnosis Date Noted   Atopic dermatitis of scalp 07/21/2021   Primary osteoarthritis involving multiple joints 07/21/2021   Blurred vision, bilateral 07/21/2021   Right foot drop 01/08/2021   Visit for screening mammogram 12/03/2020   Encounter for general adult medical examination with abnormal findings 12/03/2020   Cervical cancer screening 12/03/2020   Dyslipidemia, goal LDL below 160 12/03/2020   Need for vaccination 12/03/2020   Neck pain, chronic 12/03/2020   Secondary progressive multiple sclerosis ([place]) 04/03/2019   Vitamin D deficiency 04/03/2019   Depression 04/03/2019   Ataxic gait 04/03/2019   High risk medication use 04/03/2019   Delayed sleep phase syndrome 04/03/2019    Arliss Journey, PT, DPT  08/26/2021, 4:58 PM  [facility] [address] [place], [place], [address] Phone: [phone]   Fax:  [phone]  Name: SERRINA MINOGUE MRN: [id number] Date of Birth: 1980-09-26

## 2021-09-01 ENCOUNTER — Encounter: Payer: Self-pay | Admitting: Physical Therapy

## 2021-09-01 ENCOUNTER — Ambulatory Visit: Payer: Medicare Other | Admitting: Physical Therapy

## 2021-09-01 ENCOUNTER — Other Ambulatory Visit: Payer: Self-pay

## 2021-09-01 DIAGNOSIS — R2689 Other abnormalities of gait and mobility: Secondary | ICD-10-CM

## 2021-09-01 DIAGNOSIS — M6281 Muscle weakness (generalized): Secondary | ICD-10-CM

## 2021-09-01 DIAGNOSIS — R262 Difficulty in walking, not elsewhere classified: Secondary | ICD-10-CM | POA: Diagnosis not present

## 2021-09-01 DIAGNOSIS — R29818 Other symptoms and signs involving the nervous system: Secondary | ICD-10-CM | POA: Diagnosis not present

## 2021-09-01 NOTE — Therapy (Signed)
[facility] [address] [place], [place], [address] Phone: [phone]   Fax:  [phone]  Physical Therapy Treatment  Patient Details  Name: Mary Jackson MRN: [id number] Date of Birth: 12/19/1980 Referring Provider (PT): Britt Bottom, MD   Encounter Date: 09/01/2021   PT End of Session - 09/01/21 1207     Visit Number 8    Number of Visits 13    Date for PT Re-Evaluation 09/21/21    Authorization Type UHC    PT Start Time 1103    PT Stop Time 1143    PT Time Calculation (min) 40 min    Equipment Utilized During Treatment Gait belt    Activity Tolerance Patient tolerated treatment well    Behavior During Therapy WFL for tasks assessed/performed             Past Medical History:  Diagnosis Date   Depression with anxiety    Migraines    MS (multiple sclerosis) ([place])     Past Surgical History:  Procedure Laterality Date   BREAST LUMPECTOMY WITH RADIOACTIVE SEED LOCALIZATION Right 06/17/2021   Procedure: RIGHT BREAST LUMPECTOMY WITH RADIOACTIVE SEED LOCALIZATION;  Surgeon: Jovita Kussmaul, MD;  Location: [facility];  Service: General;  Laterality: Right;    There were no vitals filed for this visit.   Subjective Assessment - 09/01/21 1105     Subjective No changes, no falls. Sister has not yet made an appt with the neurologist.    Pertinent History MS, depression    Limitations Standing;Walking    Patient Stated Goals Wants to move better, not use her rollator.    Currently in Pain? No/denies                               [facility] Adult PT Treatment/Exercise - 09/01/21 1114       Ambulation/Gait   Ambulation/Gait Yes    Ambulation/Gait Assistance 5: Supervision    Ambulation/Gait Assistance Details Went outdoors on pavement due to safety drill, needing cues to use brakes while going down incline. Pt getting R foot caught at times, but able to self correct  without LOB.    Ambulation Distance (Feet) 100 Feet    Assistive device Rollator    Gait Pattern Step-through pattern;Decreased step length - left;Decreased step length - right;Decreased stance time - left;Decreased stance time - right;Decreased hip/knee flexion - right;Decreased hip/knee flexion - left;Decreased dorsiflexion - right;Decreased dorsiflexion - left;Wide base of support;Poor foot clearance - left;Poor foot clearance - right;Abducted- right    Ambulation Surface Level;Indoor;Unlevel;Outdoor;Paved      Knee/Hip Exercises: Aerobic   Nustep With BLE/BUE for strengthening, activity tolerance for 8 minutes at gear 2.0. Last minute with BLE only.                 Balance Exercises - 09/01/21 1131       Balance Exercises: Standing   Standing Eyes Closed Wide (BOA);Solid surface;Limitations    Standing Eyes Closed Limitations x30 seconds static, x10 reps head turns. Wide BOS position due to genu valgum    SLS with Vectors Solid surface    SLS with Vectors Limitations Alternating toe taps to gumdrops on floor with BUE support > single x12 reps each side.    Stepping Strategy Anterior;10 reps    Stepping Strategy Limitations On firm surface, alternating forward steps with no UE support, cued for incr step length.  Sidestepping Upper extremity support;1 rep    Sidestepping Limitations Down and back x1 rep with cues to keep a soft bend in the knee and proper hip/foot alignment to keep toes forwards.                PT Education - 09/01/21 1115     Education Details Pt needing to make an appt with neurologist for face to face visit to obtain an AFO. PT wrote pt's sister a note with instructions as she would be the one to call and make an appt.    Person(s) Educated Patient    Methods Explanation;Handout    Comprehension Verbalized understanding              PT Short Term Goals - 07/22/21 1017       PT SHORT TERM GOAL #1   Title Patient will be independent with  initial HEP in order to build upon functional gains made in therapy. ALL STGS DUE 07/21/21    Baseline reviewed/updated at last session, pt reports performing consistently at home.    Time 4    Period Weeks    Status Achieved    Target Date 07/21/21      PT SHORT TERM GOAL #2   Title Patient will decrease TUG time to 24 seconds or less in order to demo decreased fall risk.    Baseline 27.04 seconds with rollator; 21.15 seconds on 07/22/21    Time 4    Period Weeks    Status Achieved      PT SHORT TERM GOAL #3   Title Pt will improve gait speed to at least 1.5 ft/sec in order to demo decr fall risk/improved household mobility.    Baseline 1.25 ft/sec, 18.94 seconds = 1.73 ft/sec    Time 4    Period Weeks    Status Achieved               PT Long Term Goals - 08/26/21 1658       PT LONG TERM GOAL #1   Title Patient will improve gait speed with rollator to at least 1.90 ft/sec in order to demo decreased fall risk. ALL LTGS DUE 09/21/21    Baseline 1.25 ft/sec with rollator; 18.94 seconds = 1.73 ft/sec on 07/22/21, 15.38 seconds = 2.13 ft/sec on 08/26/21    Time 12    Period Weeks    Status Achieved    Target Date 09/21/21      PT LONG TERM GOAL #2   Title pt will be independent with final HEP in order to build upon functional gains made in therapy.    Baseline pt will benefit from updates/review    Time 12    Period Weeks    Status On-going      PT LONG TERM GOAL #3   Title Patient will decrease 5x sit <> stand score to 18 seconds or less from standard height chair with UE support in order to demo improved strength and decr fall risk.    Baseline 22.25 seconds with BUE support from chair, 15.97 seconds on 08/26/21    Time 12    Period Weeks    Status Achieved      PT LONG TERM GOAL #4   Title Pt will ambulate at least 230' with rollator and supervision with R AFO in order to demo improved household mobility.    Baseline pt has not yet received her AFO    Time 12  Period Weeks    Status On-going    Target Date 09/23/21      PT LONG TERM GOAL #5   Title Pt will improve BERG to at least a 43/56 in order to demo decr fall risk.    Baseline 39/56    Time 12    Period Weeks    Status New                   Plan - 09/01/21 1208     Clinical Impression Statement Today's skilled session focused on BLE strengthening and standing balance strategies. Pt needing UE support for SLS tasks and needs cues to keep a soft bend in her knees to prevent hyperextension during static tasks. Pt's sister has not yet called neurologist to make face to face visit to obtain AFO. Sent home a note again with these instructions. Will continue to progress towards LTGs.    Personal Factors and Comorbidities Age;Comorbidity 2;Finances;Past/Current Experience;Time since onset of injury/illness/exacerbation    Comorbidities MS, depression    Examination-Activity Limitations Locomotion Level;Stand;Squat;Transfers    Examination-Participation Restrictions Community Activity;Cleaning;Laundry    Stability/Clinical Decision Making Evolving/Moderate complexity    Rehab Potential Good    PT Frequency 1x / week    PT Duration 12 weeks    PT Treatment/Interventions ADLs/Self Care Home Management;DME Instruction;Gait training;Stair training;Therapeutic activities;Functional mobility training;Therapeutic exercise;Balance training;Neuromuscular re-education;Orthotic Fit/Training;Patient/family education;Passive range of motion;Energy conservation;Vestibular;Manual techniques    PT Next Visit Plan did pt make neurologist appt? Continue gait with R Post [employer] Walk On. Standing balance/strengthening. SciFit/Nustep. Will D/C at end of scheduled visits. Will finalize HEP.    PT Home Exercise Plan Access Code: [email or web address]    Consulted and Agree with Plan of Care Patient             Patient will benefit from skilled therapeutic intervention in order to improve the following deficits and  impairments:  Decreased activity tolerance, Abnormal gait, Decreased balance, Decreased coordination, Decreased endurance, Decreased mobility, Decreased range of motion, Difficulty walking, Decreased strength, Postural dysfunction, Impaired flexibility, Pain  Visit Diagnosis: Difficulty in walking, not elsewhere classified  Muscle weakness (generalized)  Other abnormalities of gait and mobility     Problem List Patient Active Problem List   Diagnosis Date Noted   Atopic dermatitis of scalp 07/21/2021   Primary osteoarthritis involving multiple joints 07/21/2021   Blurred vision, bilateral 07/21/2021   Right foot drop 01/08/2021   Visit for screening mammogram 12/03/2020   Encounter for general adult medical examination with abnormal findings 12/03/2020   Cervical cancer screening 12/03/2020   Dyslipidemia, goal LDL below 160 12/03/2020   Need for vaccination 12/03/2020   Neck pain, chronic 12/03/2020   Secondary progressive multiple sclerosis ([place]) 04/03/2019   Vitamin D deficiency 04/03/2019   Depression 04/03/2019   Ataxic gait 04/03/2019   High risk medication use 04/03/2019   Delayed sleep phase syndrome 04/03/2019    Arliss Journey, PT, DPT  09/01/2021, 12:10 PM  [facility] [address] [place], [place], [address] Phone: [phone]   Fax:  [phone]  Name: Mary Jackson MRN: [id number] Date of Birth: 07-06-1981

## 2021-09-02 ENCOUNTER — Telehealth: Payer: Self-pay | Admitting: Neurology

## 2021-09-02 DIAGNOSIS — G35 Multiple sclerosis: Secondary | ICD-10-CM | POA: Diagnosis not present

## 2021-09-02 NOTE — Telephone Encounter (Signed)
Hi Mary Jackson-Dr. Felecia Shelling included in last note need for AFO brace. Will this suffice?

## 2021-09-02 NOTE — Telephone Encounter (Signed)
Dr. Felecia Shelling- are you able to addend last note from October to show medical necessity for AFO brace for R foot drop? We can then f/u with PT. Thank you

## 2021-09-02 NOTE — Telephone Encounter (Signed)
Physical Therapist, Chloe, sent patient in to make an appointment with Dr. Felecia Shelling for a face to face visit for her to get a brace (AFO) for her right foot to help her walk.

## 2021-09-04 NOTE — Telephone Encounter (Signed)
Patient's first Ocrevus 300mg  dose was 09/02/2021.

## 2021-09-08 ENCOUNTER — Ambulatory Visit: Payer: Medicare Other

## 2021-09-08 ENCOUNTER — Other Ambulatory Visit: Payer: Self-pay

## 2021-09-08 DIAGNOSIS — R262 Difficulty in walking, not elsewhere classified: Secondary | ICD-10-CM

## 2021-09-08 DIAGNOSIS — R2689 Other abnormalities of gait and mobility: Secondary | ICD-10-CM

## 2021-09-08 DIAGNOSIS — M6281 Muscle weakness (generalized): Secondary | ICD-10-CM | POA: Diagnosis not present

## 2021-09-08 DIAGNOSIS — R29818 Other symptoms and signs involving the nervous system: Secondary | ICD-10-CM | POA: Diagnosis not present

## 2021-09-08 NOTE — Patient Instructions (Addendum)
[facility] Information:  [facility] OFFICE:  [address] [place], [place] [address] Phone: [phone] Fax: [phone]    All information has been faxed over for her to obtain an AFO. Please call and schedule an appointment.          Access Code: [email or web address] URL: [email or web address] Date: 09/08/2021 Prepared by: Baldomero Lamy  Exercises Small Range Straight Leg Raise - 1 x daily - 5 x weekly - 2 sets - 10 reps Supine Bridge with Resistance Band - 1 x daily - 5 x weekly - 2 sets - 10 reps Clamshell with Resistance - 1 x daily - 5 x weekly - 2 sets - 10 reps Seated Knee Extension with Resistance - 1 x daily - 5 x weekly - 2 sets - 10 reps Seated March with Resistance - 1 x daily - 5 x weekly - 2 sets - 10 reps Sit to Stand with Armchair - 2 x daily - 5 x weekly - 1 sets - 10 reps Heel Toe Raises with Counter Support - 1 x daily - 5 x weekly - 2 sets - 10 reps

## 2021-09-08 NOTE — Therapy (Signed)
[facility] [address] [place], [place], [address] Phone: [phone]   Fax:  [phone]  Physical Therapy Treatment  Patient Details  Name: Mary Jackson MRN: [id number] Date of Birth: 1981-02-09 Referring Provider (PT): Felecia Shelling, Nanine Means, MD   Encounter Date: 09/08/2021   PT End of Session - 09/08/21 0801     Visit Number 9    Number of Visits 13    Date for PT Re-Evaluation 09/21/21    Authorization Type UHC    PT Start Time 0800    PT Stop Time 0842    PT Time Calculation (min) 42 min    Equipment Utilized During Treatment Gait belt    Activity Tolerance Patient tolerated treatment well    Behavior During Therapy WFL for tasks assessed/performed             Past Medical History:  Diagnosis Date   Depression with anxiety    Migraines    MS (multiple sclerosis) ([place])     Past Surgical History:  Procedure Laterality Date   BREAST LUMPECTOMY WITH RADIOACTIVE SEED LOCALIZATION Right 06/17/2021   Procedure: RIGHT BREAST LUMPECTOMY WITH RADIOACTIVE SEED LOCALIZATION;  Surgeon: Jovita Kussmaul, MD;  Location: [facility];  Service: General;  Laterality: Right;    There were no vitals filed for this visit.   Subjective Assessment - 09/08/21 0800     Subjective Reports no new changes. Had a good weekend. No falls to report. No headache currently, but reports they were there over the weekend.    Pertinent History MS, depression    Limitations Standing;Walking    Patient Stated Goals Wants to move better, not use her rollator.    Currently in Pain? No/denies               [facility] Adult PT Treatment/Exercise - 09/08/21 0001       Transfers   Transfers Sit to Stand;Stand to Sit    Sit to Stand 5: Supervision;With upper extremity assist;From chair/3-in-1    Stand to Sit 5: Supervision      Ambulation/Gait   Ambulation/Gait Yes    Ambulation/Gait Assistance 5: Supervision     Ambulation/Gait Assistance Details ambulation into/out of session    Assistive device Rollator    Gait Pattern Step-through pattern;Decreased step length - left;Decreased step length - right;Decreased stance time - left;Decreased stance time - right;Decreased hip/knee flexion - right;Decreased hip/knee flexion - left;Decreased dorsiflexion - right;Decreased dorsiflexion - left;Wide base of support;Poor foot clearance - left;Poor foot clearance - right;Abducted- right    Ambulation Surface Level;Indoor      Exercises   Exercises Knee/Hip;Other Exercises    Other Exercises  Reviewed entire HEP with [place] (see below):      Knee/Hip Exercises: Aerobic   Nustep Completed with BLE/BUE for strengthening, activity tolerance for  minutes at gear 3.0. Pt tolerating increase in resistance well.            Reviewed and completed all of the following exercises during session with HEP:  Access Code: [email or web address] URL: [email or web address] Date: 09/08/2021 Prepared by: Baldomero Lamy  Exercises Small Range Straight Leg Raise - 1 x daily - 5 x weekly - 2 sets - 10 reps - cues for technique Supine Bridge with Resistance Band - 1 x daily - 5 x weekly - 2 sets - 10 reps - added in red theraband Clamshell with Resistance - 1 x daily - 5 x weekly - 2 sets -  10 reps - added in red theraband Seated Knee Extension with Resistance - 1 x daily - 5 x weekly - 2 sets - 10 reps - continue to complete with red therabands Seated March with Resistance - 1 x daily - 5 x weekly - 2 sets - 10 reps - added in red theraband Sit to Stand with Armchair - 2 x daily - 5 x weekly - 1 sets - 10 reps Heel Toe Raises with Counter Support - 1 x daily - 5 x weekly - 2 sets - 10 reps - progressed from seated to standing    PT Education - 09/08/21 0801     Education Details [place] and Call to schedule Appt; Educated on HEP Update    Person(s) Educated Patient    Methods  Explanation;Handout;Demonstration    Comprehension Verbalized understanding;Returned demonstration              PT Short Term Goals - 07/22/21 1017       PT SHORT TERM GOAL #1   Title Patient will be independent with initial HEP in order to build upon functional gains made in therapy. ALL STGS DUE 07/21/21    Baseline reviewed/updated at last session, pt reports performing consistently at home.    Time 4    Period Weeks    Status Achieved    Target Date 07/21/21      PT SHORT TERM GOAL #2   Title Patient will decrease TUG time to 24 seconds or less in order to demo decreased fall risk.    Baseline 27.04 seconds with rollator; 21.15 seconds on 07/22/21    Time 4    Period Weeks    Status Achieved      PT SHORT TERM GOAL #3   Title Pt will improve gait speed to at least 1.5 ft/sec in order to demo decr fall risk/improved household mobility.    Baseline 1.25 ft/sec, 18.94 seconds = 1.73 ft/sec    Time 4    Period Weeks    Status Achieved               PT Long Term Goals - 09/08/21 9983       PT LONG TERM GOAL #1   Title Patient will improve gait speed with rollator to at least 1.90 ft/sec in order to demo decreased fall risk. ALL LTGS DUE 09/21/21    Baseline 1.25 ft/sec with rollator; 18.94 seconds = 1.73 ft/sec on 07/22/21, 15.38 seconds = 2.13 ft/sec on 08/26/21    Time 12    Period Weeks    Status Achieved    Target Date 09/21/21      PT LONG TERM GOAL #2   Title pt will be independent with final HEP in order to build upon functional gains made in therapy.    Baseline pt will benefit from updates/review; reports independence with HEP    Time 12    Period Weeks    Status Achieved      PT LONG TERM GOAL #3   Title Patient will decrease 5x sit <> stand score to 18 seconds or less from standard height chair with UE support in order to demo improved strength and decr fall risk.    Baseline 22.25 seconds with BUE support from chair, 15.97 seconds on 08/26/21    Time  12    Period Weeks    Status Achieved      PT LONG TERM GOAL #4   Title Pt will ambulate  at least 49' with rollator and supervision with R AFO in order to demo improved household mobility.    Baseline pt has not yet received her AFO    Time 12    Period Weeks    Status On-going    Target Date 09/23/21      PT LONG TERM GOAL #5   Title Pt will improve BERG to at least a 43/56 in order to demo decr fall risk.    Baseline 39/56    Time 12    Period Weeks    Status New                   Plan - 09/08/21 0981     Clinical Impression Statement Today's skilled PT session included review and update of HEP to finalize program prior to d/c. PT tolerating progresison of activities well. Will continue per POC.    Personal Factors and Comorbidities Age;Comorbidity 2;Finances;Past/Current Experience;Time since onset of injury/illness/exacerbation    Comorbidities MS, depression    Examination-Activity Limitations Locomotion Level;Stand;Squat;Transfers    Examination-Participation Restrictions Community Activity;Cleaning;Laundry    Stability/Clinical Decision Making Evolving/Moderate complexity    Rehab Potential Good    PT Frequency 1x / week    PT Duration 12 weeks    PT Treatment/Interventions ADLs/Self Care Home Management;DME Instruction;Gait training;Stair training;Therapeutic activities;Functional mobility training;Therapeutic exercise;Balance training;Neuromuscular re-education;Orthotic Fit/Training;Patient/family education;Passive range of motion;Energy conservation;Vestibular;Manual techniques    PT Next Visit Plan Gave patient information for [facility]. Plan to check LTGs. Will D/C at end of scheduled visits. Will finalize HEP.    PT Home Exercise Plan Access Code: [email or web address]    Consulted and Agree with Plan of Care Patient             Patient will benefit from skilled therapeutic intervention in order to improve the following deficits and impairments:  Decreased activity  tolerance, Abnormal gait, Decreased balance, Decreased coordination, Decreased endurance, Decreased mobility, Decreased range of motion, Difficulty walking, Decreased strength, Postural dysfunction, Impaired flexibility, Pain  Visit Diagnosis: Difficulty in walking, not elsewhere classified  Muscle weakness (generalized)  Other abnormalities of gait and mobility     Problem List Patient Active Problem List   Diagnosis Date Noted   Atopic dermatitis of scalp 07/21/2021   Primary osteoarthritis involving multiple joints 07/21/2021   Blurred vision, bilateral 07/21/2021   Right foot drop 01/08/2021   Visit for screening mammogram 12/03/2020   Encounter for general adult medical examination with abnormal findings 12/03/2020   Cervical cancer screening 12/03/2020   Dyslipidemia, goal LDL below 160 12/03/2020   Need for vaccination 12/03/2020   Neck pain, chronic 12/03/2020   Secondary progressive multiple sclerosis ([place]) 04/03/2019   Vitamin D deficiency 04/03/2019   Depression 04/03/2019   Ataxic gait 04/03/2019   High risk medication use 04/03/2019   Delayed sleep phase syndrome 04/03/2019    Jones Bales, PT, DPT 09/08/2021, 8:41 AM  [facility] [address] [place], [place], [address] Phone: [phone]   Fax:  [phone]  Name: Mary Jackson MRN: [id number] Date of Birth: [birth year]

## 2021-09-09 ENCOUNTER — Encounter (HOSPITAL_COMMUNITY): Payer: Self-pay

## 2021-09-15 ENCOUNTER — Other Ambulatory Visit: Payer: Self-pay

## 2021-09-15 ENCOUNTER — Encounter: Payer: Self-pay | Admitting: Physical Therapy

## 2021-09-15 ENCOUNTER — Ambulatory Visit: Payer: Medicare Other | Attending: Internal Medicine | Admitting: Physical Therapy

## 2021-09-15 DIAGNOSIS — R2689 Other abnormalities of gait and mobility: Secondary | ICD-10-CM | POA: Diagnosis not present

## 2021-09-15 DIAGNOSIS — G35 Multiple sclerosis: Secondary | ICD-10-CM | POA: Diagnosis not present

## 2021-09-15 DIAGNOSIS — R262 Difficulty in walking, not elsewhere classified: Secondary | ICD-10-CM | POA: Diagnosis not present

## 2021-09-15 DIAGNOSIS — M6281 Muscle weakness (generalized): Secondary | ICD-10-CM | POA: Diagnosis not present

## 2021-09-15 NOTE — Therapy (Signed)
[facility] [address] [place], [place], [address] Phone: [phone]   Fax:  [phone]  Physical Therapy Treatment/10th Visit PN/Discharge Summary  Patient Details  Name: Mary Jackson MRN: [id number] Date of Birth: August 27, 1980 Referring Provider (PT): Britt Bottom, MD  10th Visit Physical Therapy Progress Note  Dates of Reporting Period: 06/23/22 to 09/15/21    Encounter Date: 09/15/2021   PT End of Session - 09/15/21 0851     Visit Number 10    Number of Visits 13    Date for PT Re-Evaluation 09/21/21    Authorization Type [employer]    PT Start Time 0848    PT Stop Time 0928   10 minutes non-billable due to pt being in restroom.   PT Time Calculation (min) 40 min    Equipment Utilized During Treatment Gait belt    Activity Tolerance Patient tolerated treatment well    Behavior During Therapy WFL for tasks assessed/performed             Past Medical History:  Diagnosis Date   Depression with anxiety    Migraines    MS (multiple sclerosis) ([place])     Past Surgical History:  Procedure Laterality Date   BREAST LUMPECTOMY WITH RADIOACTIVE SEED LOCALIZATION Right 06/17/2021   Procedure: RIGHT BREAST LUMPECTOMY WITH RADIOACTIVE SEED LOCALIZATION;  Surgeon: Jovita Kussmaul, MD;  Location: [facility];  Service: General;  Laterality: Right;    There were no vitals filed for this visit.   Subjective Assessment - 09/15/21 0850     Subjective Has an appt with [facility] on March 13th at 19 AM.    Pertinent History MS, depression    Limitations Standing;Walking    Patient Stated Goals Wants to move better, not use her rollator.                [facility] PT Assessment - 09/15/21 0857       Berg Balance Test   Sit to Stand Able to stand without using hands and stabilize independently    Standing Unsupported Able to stand safely 2 minutes    Sitting with Back Unsupported but Feet Supported on  Floor or Stool Able to sit safely and securely 2 minutes    Stand to Sit Sits safely with minimal use of hands    Transfers Able to transfer safely, definite need of hands    Standing Unsupported with Eyes Closed Able to stand 10 seconds safely    Standing Unsupported with Feet Together Able to place feet together independently and stand for 1 minute with supervision    From Standing, Reach Forward with Outstretched Arm Can reach confidently >25 cm (10")    From Standing Position, Pick up Object from [place] to pick up shoe safely and easily    From Standing Position, Turn to Look Behind Over each Shoulder Looks behind from both sides and weight shifts well    Turn 360 Degrees Able to turn 360 degrees safely but slowly    Standing Unsupported, Alternately Place Feet on Step/Stool Needs assistance to keep from falling or unable to try    Standing Unsupported, One Foot in Front Able to take small step independently and hold 30 seconds    Standing on One Leg Unable to try or needs assist to prevent fall    Total Score 42    Berg comment: Significant fall risk  [facility] PT Treatment/Exercise - 09/15/21 0852       Knee/Hip Exercises: Aerobic   Nustep Completed with BLE/BUE for strengthening, activity tolerance at gear 3.0. Pt only able to perform for 3 minutes today(pt needing to use the restroom).                 Balance Exercises - 09/15/21 0926       Balance Exercises: Standing   Standing Eyes Opened Wide (BOA);Foam/compliant surface;Limitations    Standing Eyes Opened Limitations On air ex, 2 x 30 seconds - pt needing intermittent taps to chair for balance, min guard for balance.    Standing Eyes Closed Solid surface    Standing Eyes Closed Limitations 2x30 seconds static, Wider BOS position due to genu valgum             PHYSICAL THERAPY DISCHARGE SUMMARY  Visits from Start of Care: 10  Current functional level related  to goals / functional outcomes: See LTGs.   Remaining deficits: Impaired balance, gait abnormalities, decr strength, impaired coordination.   Education / Equipment: HEP, information to obtain AFO from [facility] for RLE (pt has appt on March 13th).   Patient agrees to discharge. Patient goals were partially met. Patient is being discharged due to meeting the stated rehab goals.    PT Education - 09/15/21 1021     Education Details Results of BERG, importance of continuing with HEP.    Person(s) Educated Patient    Methods Explanation    Comprehension Verbalized understanding              PT Short Term Goals - 07/22/21 1017       PT SHORT TERM GOAL #1   Title Patient will be independent with initial HEP in order to build upon functional gains made in therapy. ALL STGS DUE 07/21/21    Baseline reviewed/updated at last session, pt reports performing consistently at home.    Time 4    Period Weeks    Status Achieved    Target Date 07/21/21      PT SHORT TERM GOAL #2   Title Patient will decrease TUG time to 24 seconds or less in order to demo decreased fall risk.    Baseline 27.04 seconds with rollator; 21.15 seconds on 07/22/21    Time 4    Period Weeks    Status Achieved      PT SHORT TERM GOAL #3   Title Pt will improve gait speed to at least 1.5 ft/sec in order to demo decr fall risk/improved household mobility.    Baseline 1.25 ft/sec, 18.94 seconds = 1.73 ft/sec    Time 4    Period Weeks    Status Achieved               PT Long Term Goals - 09/15/21 0920       PT LONG TERM GOAL #1   Title Patient will improve gait speed with rollator to at least 1.90 ft/sec in order to demo decreased fall risk. ALL LTGS DUE 09/21/21    Baseline 1.25 ft/sec with rollator; 18.94 seconds = 1.73 ft/sec on 07/22/21, 15.38 seconds = 2.13 ft/sec on 08/26/21    Time 12    Period Weeks    Status Achieved    Target Date 09/21/21      PT LONG TERM GOAL #2   Title pt will be  independent with final HEP in order to build upon functional gains made in therapy.  Baseline pt will benefit from updates/review; reports independence with HEP    Time 12    Period Weeks    Status Achieved      PT LONG TERM GOAL #3   Title Patient will decrease 5x sit <> stand score to 18 seconds or less from standard height chair with UE support in order to demo improved strength and decr fall risk.    Baseline 22.25 seconds with BUE support from chair, 15.97 seconds on 08/26/21    Time 12    Period Weeks    Status Achieved      PT LONG TERM GOAL #4   Title Pt will ambulate at least 230' with rollator and supervision with R AFO in order to demo improved household mobility.    Baseline pt has not yet received her AFO - has appt with [facility] on the 13th    Time 12    Period Weeks    Status Not Met    Target Date 09/23/21      PT LONG TERM GOAL #5   Title Pt will improve BERG to at least a 43/56 in order to demo decr fall risk.    Baseline 39/56; 42/56 on 09/15/21    Time 12    Period Weeks    Status Not Met                   Plan - 09/15/21 1022     Clinical Impression Statement 10th visit PN: Session partially limited today due to pt needing to use the restroom. Pt did not meet LTG #4 in regards to BERG. Improved score to a 42/56 (previously 39/56), putting pt at a significant fall risk. Pt will be going to [facility] next week for an AFO for improved gait mechanics/safety with RLE. Last session, pt's HEP was reviewed with pt having no questions at this time. Due to progress towards goals, pt will be discharged from therapy at this time. Pt in agreement with plan.    Personal Factors and Comorbidities Age;Comorbidity 2;Finances;Past/Current Experience;Time since onset of injury/illness/exacerbation    Comorbidities MS, depression    Examination-Activity Limitations Locomotion Level;Stand;Squat;Transfers    Examination-Participation Restrictions Community  Activity;Cleaning;Laundry    Stability/Clinical Decision Making Evolving/Moderate complexity    Rehab Potential Good    PT Frequency 1x / week    PT Duration 12 weeks    PT Treatment/Interventions ADLs/Self Care Home Management;DME Instruction;Gait training;Stair training;Therapeutic activities;Functional mobility training;Therapeutic exercise;Balance training;Neuromuscular re-education;Orthotic Fit/Training;Patient/family education;Passive range of motion;Energy conservation;Vestibular;Manual techniques    PT Next Visit Plan D.C    PT Home Exercise Plan Access Code: [email or web address]    Consulted and Agree with Plan of Care Patient             Patient will benefit from skilled therapeutic intervention in order to improve the following deficits and impairments:  Decreased activity tolerance, Abnormal gait, Decreased balance, Decreased coordination, Decreased endurance, Decreased mobility, Decreased range of motion, Difficulty walking, Decreased strength, Postural dysfunction, Impaired flexibility, Pain  Visit Diagnosis: Difficulty in walking, not elsewhere classified  Muscle weakness (generalized)  Other abnormalities of gait and mobility     Problem List Patient Active Problem List   Diagnosis Date Noted   Atopic dermatitis of scalp 07/21/2021   Primary osteoarthritis involving multiple joints 07/21/2021   Blurred vision, bilateral 07/21/2021   Right foot drop 01/08/2021   Visit for screening mammogram 12/03/2020   Encounter for general adult medical examination with abnormal findings 12/03/2020   Cervical  cancer screening 12/03/2020   Dyslipidemia, goal LDL below 160 12/03/2020   Need for vaccination 12/03/2020   Neck pain, chronic 12/03/2020   Secondary progressive multiple sclerosis ([place]) 04/03/2019   Vitamin D deficiency 04/03/2019   Depression 04/03/2019   Ataxic gait 04/03/2019   High risk medication use 04/03/2019   Delayed sleep phase syndrome 04/03/2019    Arliss Journey, PT, DPT 09/15/2021, 10:27 AM  [facility] [address] [place], [place], [address] Phone: [phone]   Fax:  [phone]  Name: KRISHANA LUTZE MRN: [id number] Date of Birth: Dec 01, 1980

## 2021-10-09 ENCOUNTER — Other Ambulatory Visit: Payer: Self-pay

## 2021-10-09 MED ORDER — BACLOFEN 10 MG PO TABS: 10.0000 mg | ORAL_TABLET | Freq: Three times a day (TID) | ORAL | 1 refills | Status: DC

## 2021-10-14 ENCOUNTER — Ambulatory Visit (INDEPENDENT_AMBULATORY_CARE_PROVIDER_SITE_OTHER): Payer: Medicare Other

## 2021-10-14 DIAGNOSIS — M21371 Foot drop, right foot: Secondary | ICD-10-CM | POA: Diagnosis not present

## 2021-10-14 DIAGNOSIS — Z3042 Encounter for surveillance of injectable contraceptive: Secondary | ICD-10-CM | POA: Diagnosis not present

## 2021-10-14 MED ORDER — MEDROXYPROGESTERONE ACETATE 150 MG/ML IM SUSP
150.0000 mg | Freq: Once | INTRAMUSCULAR | Status: AC
  Administered 2021-10-14: 150 mg via INTRAMUSCULAR

## 2021-10-14 NOTE — Progress Notes (Signed)
Depo provera '150mg'$  given IM right GM, next injection is due 12/30/21-01/13/22 ?

## 2021-10-29 ENCOUNTER — Telehealth: Payer: Self-pay | Admitting: Neurology

## 2021-10-29 NOTE — Telephone Encounter (Signed)
Pt sister Rollene Fare (on [place]) calling to enter in new mail delivery pharmacy prescriptions be sent to. Pharmacy did not exist in our system, would like a call back.  ?

## 2021-10-29 NOTE — Telephone Encounter (Signed)
Called the patient's sister and she states that [facility] sent a letter stating that her medications have been canceled.  She began to list the medicines that were on the list from the [employer].  Medication requested for gabapentin, sertaline, etodolac, ibuprofen.  Baclofen had been sent already.  Upon reviewing the chart her medication list continues to be confusing.  The gabapentin or sertaline, etodolac is not listed as a current medication. ?In her last visit with Dr. Felecia Shelling 04/21/21 he stated to continue gabapentin, baclofen, sertraline.  ?At some point the gabapentin and zoloft was dc'd but not by [place].  ?She had a visit with Dr Ronnald Ramp (PCP) in jan 2023 and his plan states she will continue ibuprofen and  ?"discontinued Miriah K. Heishman's etodolac. I am also having her start on clobetasol and ibuprofen. Additionally, I am having her maintain her ferrous sulfate, Cholecalciferol (VITAMIN D3 PO), Ascorbic Acid (VITAMIN C PO), Tysabri, dalfampridine, gabapentin, baclofen, traZODone, medroxyPROGESTERone, and HYDROcodone-acetaminophen." ? ?I advised that we need to get her in for a follow up visit with Dr Felecia Shelling because he wanted to see her back in 6 mths and she has started [facility] 09/02/21. This will also allow them to discuss her medications and make sure he is on the right medication.  ?Scheduled 5/1 for follow up visit with Dr Felecia Shelling. Sister did state Dr Ronnald Ramp stopped the zoloft cause she stated she didn't need it.  ? ?The sister is going to verify if the pt  is taking gabapentin and what strength she is taking.  ?I have gabapentin 300 mg three times a day was last thing ordered. If that is correct [facility]. ?She will also clarify the dalfampridine and whether that [facility].  ? ?**Sister will call back with the above information and was advised to Leave a message.  ?

## 2021-11-10 ENCOUNTER — Encounter: Payer: Self-pay | Admitting: Neurology

## 2021-11-10 ENCOUNTER — Ambulatory Visit (INDEPENDENT_AMBULATORY_CARE_PROVIDER_SITE_OTHER): Payer: Medicare Other | Admitting: Neurology

## 2021-11-10 VITALS — BP 103/63 | HR 75 | Ht 67.0 in | Wt 171.0 lb

## 2021-11-10 DIAGNOSIS — Z79899 Other long term (current) drug therapy: Secondary | ICD-10-CM | POA: Diagnosis not present

## 2021-11-10 DIAGNOSIS — M21371 Foot drop, right foot: Secondary | ICD-10-CM | POA: Diagnosis not present

## 2021-11-10 DIAGNOSIS — R3915 Urgency of urination: Secondary | ICD-10-CM

## 2021-11-10 DIAGNOSIS — R260 Ataxic gait: Secondary | ICD-10-CM

## 2021-11-10 DIAGNOSIS — G35 Multiple sclerosis: Secondary | ICD-10-CM | POA: Diagnosis not present

## 2021-11-10 MED ORDER — DALFAMPRIDINE ER 10 MG PO TB12: 10.0000 mg | ORAL_TABLET | Freq: Two times a day (BID) | ORAL | 11 refills | Status: DC

## 2021-11-10 MED ORDER — AMANTADINE HCL 100 MG PO CAPS
100.0000 mg | ORAL_CAPSULE | Freq: Two times a day (BID) | ORAL | 11 refills | Status: DC
Start: 2021-11-10 — End: 2022-09-14

## 2021-11-10 MED ORDER — GABAPENTIN 300 MG PO CAPS: 300.0000 mg | ORAL_CAPSULE | Freq: Three times a day (TID) | ORAL | 3 refills | Status: DC

## 2021-11-10 MED ORDER — AMANTADINE HCL 100 MG PO CAPS: 100.0000 mg | ORAL_CAPSULE | Freq: Two times a day (BID) | ORAL | 11 refills | Status: DC

## 2021-11-10 NOTE — Progress Notes (Addendum)
? ?[facility] ? ?PATIENT: Mary Jackson ?DOB: Aug 19, 1980 ? ?REFERRING DOCTOR OR PCP:  Dr. Lovenia Shuck ?SOURCE: Patient, extensive notes from neurology, imaging and laboratory reports, MRI images personally reviewed. ? ?_________________________________ ? ? ?HISTORICAL ? ?CHIEF COMPLAINT:  ?Chief Complaint  ?Patient presents with  ? Follow-up  ?  Rm 16, w sister/POA. Here for 6 month MS f/u, on Ocrevus. Last infusion date: 09/15/2021 ?Next infusion date: 03/09/2022. Pt continues to feel weak. Pt feels like she did better on Tysabri. Having urinary incontinences and hurting. Needing refills. Having daily HA.   ? ? ?HISTORY OF PRESENT ILLNESS:  ?Mary Jackson is a 41 y.o. woman with relapsing/active form of secondary progressive multiple sclerosis. ? ?Update 11/10/2021: ?Due to converting to JCV Ab positive, she switched from Tysabri to [place].   She feels a little weaker andmore tired on Ocrevus than she did on Tysabri    Her infusion was 09/15/220.  Marland Kitchen  JCV antibody 03/25/2020 was 1.43 and 1.37 a second tine 03/2021 (middle positive)    This mpies a higher risk of PML compared to when she was negative (risk is about 1/300) ? ?She uses a walker due to right > left  leg weakness.  She has a right foot drop.  She sometimes stumbles due to the foot drop.   Ampyra bid helps her some.   She has ataxia in her legs.   Baclofen has helped her spasms and gabapentin tid has helped her dysesthesias.    ? ?She has urinary urgency, worse the last few days.  She also feels more tired associated with a urinary urgency..   She has reduced vision OD and colors are desaturated OD.    Sometimes she has diplopia.     ? ?She is often fatigued.  She has trouble falling asleep and seldom goes to sleep before 1 am .  She does not snore.    She has had some depression, helped by sertraline.    She has seen psychology/counseling in the past for depression, stress management and other issues.    ? ?MS History:   She was diagnosed  with MS in 2008 after presenting with left leg weakness and clumsiness and a fall.  Imaging studies were performed and she was diagnosed.   She was initially placed on Copaxone but she had multiple relapses.   She was placed on Tysabri and is now on every 6 weeks.  ? ?Imaging:  ?MRI's of the brain dated 4/2 2019 and 04/09/2016.  The MRI showed multiple T2/flair hyperintense foci with single and confluent lesions in the periventricular, juxtacortical and deep white matter and also is a few foci in the infratentorial white matter and apparently at the cervical medullary junction.   ? ?We do not have an MRI of the cervical spine.  MRI report from 2015 did mention that there were new lesions compared to the previous MRI. ? ?MRI of the brain 04/29/2020 showed Multiple single and confluent T2/FLAIR hyperintense foci in the hemispheres and a few foci in the cerebellum in a pattern configuration consistent with advanced chronic demyelinating plaque related to multiple sclerosis.  None of the foci appear to be acute and there were no changes compared to the 2019 MRI.     Generalized cortical and corpus callosum atrophy. ? ?MRI of the cervical spine 04/29/2020 showed patchy T2 hyperintense signal throughout the cervical spinal cord.  Mild multilevel degenerative changes but no spinal stenosis or nerve root compression ? ?She has no  FH of MS.   Parents have HTN.    ? ?REVIEW OF SYSTEMS: ?Constitutional: No fevers, chills, sweats, or change in appetite ?Eyes: No visual changes, double vision, eye pain ?Ear, nose and throat: No hearing loss, ear pain, nasal congestion, sore throat ?Cardiovascular: No chest pain, palpitations ?Respiratory:  No shortness of breath at rest or with exertion.   No wheezes ?GastrointestinaI: No nausea, vomiting, diarrhea, abdominal pain, fecal incontinence ?Genitourinary:  No dysuria, urinary retention or frequency.  No nocturia. ?Musculoskeletal:  No neck pain, back pain ?Integumentary: No rash,  pruritus, skin lesions ?Neurological: as above ?Psychiatric: No depression at this time.  No anxiety ?Endocrine: No palpitations, diaphoresis, change in appetite, change in weigh or increased thirst ?Hematologic/Lymphatic:  No anemia, purpura, petechiae. ?Allergic/Immunologic: No itchy/runny eyes, nasal congestion, recent allergic reactions, rashes ? ?ALLERGIES: ?No Known Allergies ? ?HOME MEDICATIONS: ? ?Current Outpatient Medications:  ?  Ascorbic Acid (VITAMIN C PO), Take 1,000 mg by mouth daily., Disp: , Rfl:  ?  baclofen (LIORESAL) 10 MG tablet, Take 1 tablet (10 mg total) by mouth 3 (three) times daily., Disp: 270 tablet, Rfl: 1 ?  Cholecalciferol (VITAMIN D3 PO), Take 5,000 Units by mouth daily., Disp: , Rfl:  ?  clobetasol (OLUX) 0.05 % topical foam, Apply topically 2 (two) times daily., Disp: 100 g, Rfl: 3 ?  ferrous sulfate 324 MG TBEC, Take 324 mg by mouth., Disp: , Rfl:  ?  HYDROcodone-acetaminophen (NORCO/VICODIN) 5-325 MG tablet, Take 1 tablet by mouth every 6 (six) hours as needed for moderate pain or severe pain., Disp: 10 tablet, Rfl: 0 ?  ibuprofen (ADVIL) 600 MG tablet, Take 1 tablet (600 mg total) by mouth every 8 (eight) hours as needed for moderate pain., Disp: 270 tablet, Rfl: 0 ?  medroxyPROGESTERone (DEPO-PROVERA) 150 MG/ML injection, Inject 1 mL (150 mg total) into the muscle every 3 (three) months., Disp: 1 mL, Rfl: 4 ?  traZODone (DESYREL) 100 MG tablet, , Disp: , Rfl:  ?  amantadine (SYMMETREL) 100 MG capsule, Take 1 capsule (100 mg total) by mouth 2 (two) times daily., Disp: 60 capsule, Rfl: 11 ?  dalfampridine 10 MG TB12, Take 1 tablet (10 mg total) by mouth 2 (two) times daily., Disp: 60 tablet, Rfl: 11 ?  gabapentin (NEURONTIN) 300 MG capsule, Take 1 capsule (300 mg total) by mouth 3 (three) times daily., Disp: 270 capsule, Rfl: 3 ? ?PAST MEDICAL HISTORY: ?Past Medical History:  ?Diagnosis Date  ? Depression with anxiety   ? Migraines   ? MS (multiple sclerosis) ([place])   ? ? ?PAST  SURGICAL HISTORY: ? ? ?FAMILY HISTORY: ?Family History  ?Problem Relation Age of Onset  ? Hypertension Sister   ? Diabetes Sister   ? ? ?SOCIAL HISTORY: ? ?Social History  ? ?Socioeconomic History  ? Marital status: Single  ?  Spouse name: Not on file  ? Number of children: Not on file  ? Years of education: Not on file  ? Highest education level: Not on file  ?Occupational History  ? Not on file  ?Tobacco Use  ? Smoking status: Every Day  ?  Types: Cigarettes  ? Smokeless tobacco: Never  ? Tobacco comments:  ?  3-4 cigs per day  ?Substance and Sexual Activity  ? Alcohol use: Not Currently  ? Drug use: Not Currently  ?  Comment: occ  ? Sexual activity: Not Currently  ?  Partners: Male  ?  Birth control/protection: Abstinence  ?Other Topics Concern  ? Not  on file  ?Social History Narrative  ? Right handed  ? Caffeine use: coffee, tea, soda  ? Lives with sister, Rollene Fare  ? ?Social Determinants of Health  ? ?Financial Resource Strain: Not on file  ?Food Insecurity: Not on file  ?Transportation Needs: Not on file  ?Physical Activity: Not on file  ?Stress: Not on file  ?Social Connections: Not on file  ?Intimate Partner Violence: Not on file  ? ? ? ?PHYSICAL EXAM ? ?Vitals:  ? 11/10/21 1308  ?BP: 103/63  ?Pulse: 75  ?Weight: 171 lb (77.6 kg)  ?Height: '5\' 7"'$  (1.702 m)  ? ? ?Body mass index is 26.78 kg/m?. ? ? ?General: The patient is well-developed and well-nourished and in no acute distress ? ?HEENT:  Head is [place]/AT.  Sclera are anicteric.    ? ?Neck: Good range of motion. ? ?Neurologic Exam ? ?Mental status: The patient is alert and oriented x 3 at the time of the examination. The patient has apparent normal recent and remote memory, with an apparently normal attention span and concentration ability.   Speech is normal. ? ?Cranial nerves: Extraocular movements show nystagmus on lateral gaze with slight disconjugate gaze looking right..   Facial strength is normal.  Trapezius and sternocleidomastoid strength is normal. No  dysarthria is noted.    No obvious hearing deficits are noted. ? ?Motor:  Muscle bulk is normal.   Tone is normal.  Strength is 5/5 in the arms, 4/5 proximally in the legs and in the feet and ankles 4+/5 the gas

## 2021-11-11 LAB — CBC WITH DIFFERENTIAL/PLATELET
Basophils Absolute: 0 10*3/uL (ref 0.0–0.2)
Basos: 1 %
EOS (ABSOLUTE): 0.1 10*3/uL (ref 0.0–0.4)
Eos: 1 %
Hematocrit: 37.6 % (ref 34.0–46.6)
Hemoglobin: 12.1 g/dL (ref 11.1–15.9)
Immature Grans (Abs): 0 10*3/uL (ref 0.0–0.1)
Immature Granulocytes: 0 %
Lymphocytes Absolute: 1.7 10*3/uL (ref 0.7–3.1)
Lymphs: 26 %
MCH: 26 pg — ABNORMAL LOW (ref 26.6–33.0)
MCHC: 32.2 g/dL (ref 31.5–35.7)
MCV: 81 fL (ref 79–97)
Monocytes Absolute: 0.6 10*3/uL (ref 0.1–0.9)
Monocytes: 9 %
Neutrophils Absolute: 4.2 10*3/uL (ref 1.4–7.0)
Neutrophils: 63 %
Platelets: 330 10*3/uL (ref 150–450)
RBC: 4.66 x10E6/uL (ref 3.77–5.28)
RDW: 13.3 % (ref 11.7–15.4)
WBC: 6.5 10*3/uL (ref 3.4–10.8)

## 2021-11-11 LAB — IGG, IGA, IGM
IgA/Immunoglobulin A, Serum: 387 mg/dL — ABNORMAL HIGH (ref 87–352)
IgG (Immunoglobin G), Serum: 1577 mg/dL (ref 586–1602)
IgM (Immunoglobulin M), Srm: 87 mg/dL (ref 26–217)

## 2021-11-11 LAB — URINALYSIS, ROUTINE W REFLEX MICROSCOPIC
Bilirubin, UA: NEGATIVE
Glucose, UA: NEGATIVE
Nitrite, UA: NEGATIVE
Specific Gravity, UA: 1.023 (ref 1.005–1.030)
Urobilinogen, Ur: 1 mg/dL (ref 0.2–1.0)
pH, UA: 6.5 (ref 5.0–7.5)

## 2021-11-11 LAB — COMPREHENSIVE METABOLIC PANEL
ALT: 37 IU/L — ABNORMAL HIGH (ref 0–32)
AST: 28 IU/L (ref 0–40)
Albumin/Globulin Ratio: 1.2 (ref 1.2–2.2)
Albumin: 4.3 g/dL (ref 3.8–4.8)
Alkaline Phosphatase: 73 IU/L (ref 44–121)
BUN/Creatinine Ratio: 25 — ABNORMAL HIGH (ref 9–23)
BUN: 16 mg/dL (ref 6–24)
Bilirubin Total: 0.3 mg/dL (ref 0.0–1.2)
CO2: 24 mmol/L (ref 20–29)
Calcium: 10.2 mg/dL (ref 8.7–10.2)
Chloride: 107 mmol/L — ABNORMAL HIGH (ref 96–106)
Creatinine, Ser: 0.64 mg/dL (ref 0.57–1.00)
Globulin, Total: 3.5 g/dL (ref 1.5–4.5)
Glucose: 63 mg/dL — ABNORMAL LOW (ref 70–99)
Potassium: 4.3 mmol/L (ref 3.5–5.2)
Sodium: 145 mmol/L — ABNORMAL HIGH (ref 134–144)
Total Protein: 7.8 g/dL (ref 6.0–8.5)
eGFR: 113 mL/min/{1.73_m2} (ref >59)

## 2021-11-11 LAB — MICROSCOPIC EXAMINATION: Casts: NONE SEEN /lpf

## 2021-11-12 ENCOUNTER — Telehealth: Payer: Self-pay | Admitting: Neurology

## 2021-11-12 LAB — URINE CULTURE

## 2021-11-12 NOTE — Telephone Encounter (Signed)
Noted  

## 2021-11-12 NOTE — Telephone Encounter (Signed)
Sarah @ [facility] has called to report that re: the dalfampridine 10 MG TB12 it is set to be delivered to pt 05-05 with zero dollar copay.  No call back requested ?

## 2021-11-13 ENCOUNTER — Telehealth: Payer: Self-pay | Admitting: *Deleted

## 2021-11-13 ENCOUNTER — Other Ambulatory Visit: Payer: Self-pay

## 2021-11-13 MED ORDER — NITROFURANTOIN MONOHYD MACRO 100 MG PO CAPS: 100.0000 mg | ORAL_CAPSULE | Freq: Two times a day (BID) | ORAL | 0 refills | Status: DC

## 2021-11-13 NOTE — Telephone Encounter (Signed)
Pts sister Rollene Fare returned call, message was relayed. All questions were answered and was appreciative of call. Rx has been sent in to the requested pharmacy.  ?

## 2021-11-13 NOTE — Telephone Encounter (Signed)
-----   Message from Britt Bottom, MD sent at 11/12/2021  5:08 PM EDT ----- ?Urine culture showed a mild urinary tract infection.  Please send in nitrofurantoin 100 mg #14 1 p.o. twice daily ?

## 2021-11-13 NOTE — Telephone Encounter (Signed)
Tried calling pt at [phone]. Mailbox full, unable to leave message. Sent SMS notification.  ?

## 2021-11-24 DIAGNOSIS — H25813 Combined forms of age-related cataract, bilateral: Secondary | ICD-10-CM | POA: Diagnosis not present

## 2021-11-24 DIAGNOSIS — D492 Neoplasm of unspecified behavior of bone, soft tissue, and skin: Secondary | ICD-10-CM | POA: Diagnosis not present

## 2021-11-24 DIAGNOSIS — H40013 Open angle with borderline findings, low risk, bilateral: Secondary | ICD-10-CM | POA: Diagnosis not present

## 2021-11-24 DIAGNOSIS — H04123 Dry eye syndrome of bilateral lacrimal glands: Secondary | ICD-10-CM | POA: Diagnosis not present

## 2021-12-15 ENCOUNTER — Telehealth: Payer: Self-pay | Admitting: Neurology

## 2021-12-15 ENCOUNTER — Ambulatory Visit (INDEPENDENT_AMBULATORY_CARE_PROVIDER_SITE_OTHER): Payer: Medicare Other

## 2021-12-15 DIAGNOSIS — Z0000 Encounter for general adult medical examination without abnormal findings: Secondary | ICD-10-CM

## 2021-12-15 DIAGNOSIS — Z1231 Encounter for screening mammogram for malignant neoplasm of breast: Secondary | ICD-10-CM | POA: Diagnosis not present

## 2021-12-15 DIAGNOSIS — N3000 Acute cystitis without hematuria: Secondary | ICD-10-CM

## 2021-12-15 MED ORDER — NITROFURANTOIN MONOHYD MACRO 100 MG PO CAPS: 100.0000 mg | ORAL_CAPSULE | Freq: Two times a day (BID) | ORAL | 0 refills | Status: AC

## 2021-12-15 NOTE — Progress Notes (Signed)
Subjective:   Mary Jackson is a 41 y.o. female who presents for an Initial Medicare Annual Wellness Visit.  Review of Systems           Objective:    There were no vitals filed for this visit. There is no height or weight on file to calculate BMI.     06/17/2021    7:03 AM 06/09/2021   12:04 PM 04/21/2019   11:10 AM  Advanced Directives  Does Patient Have a Medical Advance Directive? Yes Yes No  Type of Advance Directive Healthcare Power of [place]   Does patient want to make changes to medical advance directive?  No - Patient declined   Copy of [place] in Chart? No - copy requested No - copy requested     Current Medications (verified) Outpatient Encounter Medications as of 12/15/2021  Medication Sig   amantadine (SYMMETREL) 100 MG capsule Take 1 capsule (100 mg total) by mouth 2 (two) times daily.   Ascorbic Acid (VITAMIN C PO) Take 1,000 mg by mouth daily.   baclofen (LIORESAL) 10 MG tablet Take 1 tablet (10 mg total) by mouth 3 (three) times daily.   Cholecalciferol (VITAMIN D3 PO) Take 5,000 Units by mouth daily.   clobetasol (OLUX) 0.05 % topical foam Apply topically 2 (two) times daily.   dalfampridine 10 MG TB12 Take 1 tablet (10 mg total) by mouth 2 (two) times daily.   ferrous sulfate 324 MG TBEC Take 324 mg by mouth.   gabapentin (NEURONTIN) 300 MG capsule Take 1 capsule (300 mg total) by mouth 3 (three) times daily.   HYDROcodone-acetaminophen (NORCO/VICODIN) 5-325 MG tablet Take 1 tablet by mouth every 6 (six) hours as needed for moderate pain or severe pain.   ibuprofen (ADVIL) 600 MG tablet Take 1 tablet (600 mg total) by mouth every 8 (eight) hours as needed for moderate pain.   medroxyPROGESTERone (DEPO-PROVERA) 150 MG/ML injection Inject 1 mL (150 mg total) into the muscle every 3 (three) months.   traZODone (DESYREL) 100 MG tablet    No facility-administered encounter medications on file as of 12/15/2021.     Allergies (verified) Patient has no known allergies.   History: Past Medical History:  Diagnosis Date   Depression with anxiety    Migraines    MS (multiple sclerosis) ([place])    Past Surgical History:  Procedure Laterality Date   BREAST LUMPECTOMY WITH RADIOACTIVE SEED LOCALIZATION Right 06/17/2021   Procedure: RIGHT BREAST LUMPECTOMY WITH RADIOACTIVE SEED LOCALIZATION;  Surgeon: Jovita Kussmaul, MD;  Location: [facility];  Service: General;  Laterality: Right;   Family History  Problem Relation Age of Onset   Hypertension Sister    Diabetes Sister    Social History   Socioeconomic History   Marital status: Single    Spouse name: Not on file   Number of children: Not on file   Years of education: Not on file   Highest education level: Not on file  Occupational History   Not on file  Tobacco Use   Smoking status: Every Day    Types: Cigarettes   Smokeless tobacco: Never   Tobacco comments:    3-4 cigs per day  Substance and Sexual Activity   Alcohol use: Yes   Drug use: Not Currently    Comment: occ   Sexual activity: Not Currently    Partners: Male    Birth control/protection: Abstinence  Other Topics Concern  Not on file  Social History Narrative   Right handed   Caffeine use: coffee, tea, soda   Lives with sister, Rollene Fare   Social Determinants of Health   Financial Resource Strain: Low Risk    Difficulty of Paying Living Expenses: Not hard at all  Food Insecurity: No Food Insecurity   Worried About Charity fundraiser in the Last Year: Never true   Arboriculturist in the Last Year: Never true  Transportation Needs: No Transportation Needs   Lack of Transportation (Medical): No   Lack of Transportation (Non-Medical): No  Physical Activity: Inactive   Days of Exercise per Week: 0 days   Minutes of Exercise per Session: 0 min  Stress: No Stress Concern Present   Feeling of Stress : Not at all  Social Connections: Moderately Integrated    Frequency of Communication with Friends and Family: Three times a week   Frequency of Social Gatherings with Friends and Family: Once a week   Attends Religious Services: 1 to 4 times per year   Active Member of [employer] or Organizations: Yes   Attends Archivist Meetings: 1 to 4 times per year   Marital Status: Never married    Tobacco Counseling Ready to quit: Not Answered Counseling given: Not Answered Tobacco comments: 3-4 cigs per day   Clinical Intake:                 Diabetic?No         Activities of Daily Living    06/17/2021    7:07 AM  In your present state of health, do you have any difficulty performing the following activities:  Hearing? 0  Vision? 0  Difficulty concentrating or making decisions? 0  Walking or climbing stairs? 1  Dressing or bathing? 1    Patient Care Team: Janith Lima, MD as PCP - General (Internal Medicine) Felecia Shelling, Nanine Means, MD (Neurology) Gregor Hams, MD as Consulting Physician (Sports Medicine)  Indicate any recent Medical Services you may have received from other than [facility] providers in the past year (date may be approximate).     Assessment:   This is a routine wellness examination for [place].  Hearing/Vision screen No results found.  Dietary issues and exercise activities discussed:     Goals Addressed   None   Depression Screen    12/15/2021   10:54 AM 07/21/2021   11:03 AM 12/03/2020   10:45 AM  PHQ 2/9 Scores  PHQ - 2 Score 0 0 0  PHQ- 9 Score  0     Fall Risk    12/15/2021   10:56 AM  [place] in the past year? 0  Number falls in past yr: 0  Injury with Fall? 0    FALL RISK PREVENTION PERTAINING TO THE HOME:  Any stairs in or around the home? No  If so, are there any without handrails? No  Home free of loose throw rugs in walkways, pet beds, electrical cords, etc? Yes  Adequate lighting in your home to reduce risk of falls? Yes   ASSISTIVE DEVICES UTILIZED TO PREVENT  FALLS:  Life alert? No  Use of a cane, walker or w/c? Yes  Grab bars in the bathroom? No  Shower chair or bench in shower? No  Elevated toilet seat or a handicapped toilet? No   TIMED UP AND GO:  Was the test performed? No .  Length of time to ambulate 10 feet:  N/A  Gait steady and fast with assistive device  Cognitive Function:        12/15/2021   10:56 AM  6CIT Screen  What Year? 0 points  What month? 0 points  What time? 0 points  Count back from 20 0 points  Months in reverse 0 points  Repeat phrase 10 points  Total Score 10 points    Immunizations Immunization History  Administered Date(s) Administered   Influenza,inj,Quad PF,6+ Mos 07/21/2021   PFIZER(Purple Top)SARS-COV-2 Vaccination 10/01/2019, 10/22/2019, 06/01/2020   PNEUMOCOCCAL CONJUGATE-20 12/03/2020   Tdap 12/03/2020    TDAP status: Up to date  Flu Vaccine status: Up to date  Pneumococcal vaccine status: Up to date  Covid-19 vaccine status: Completed vaccines  Qualifies for Shingles Vaccine? No   Zostavax completed No   Shingrix Completed?: No.    Education has been provided regarding the importance of this vaccine. Patient has been advised to call insurance company to determine out of pocket expense if they have not yet received this vaccine. Advised may also receive vaccine at local pharmacy or Health Dept. Verbalized acceptance and understanding.  Screening Tests Health Maintenance  Topic Date Due   COVID-19 Vaccine (4 - Booster for Pfizer series) 07/27/2020   INFLUENZA VACCINE  02/10/2022   PAP SMEAR-Modifier  03/25/2024   TETANUS/TDAP  12/04/2030   Hepatitis C Screening  Completed   HIV Screening  Completed   HPV VACCINES  Aged Out    Health Maintenance  Health Maintenance Due  Topic Date Due   COVID-19 Vaccine (4 - Booster for Pfizer series) 07/27/2020    Colorectal cancer screening: No longer required.   Mammogram status: Ordered mammogram. Pt provided with contact info and  advised to call to schedule appt.     Lung Cancer Screening: (Low Dose CT Chest recommended if Age 71-80 years, 30 pack-year currently smoking OR have quit w/in 15years.) does qualify.   Lung Cancer Screening Referral:   Additional Screening:  Hepatitis C Screening: does qualify; Completed 04/21/2021  Vision Screening: Recommended annual ophthalmology exams for early detection of glaucoma and other disorders of the eye. Is the patient up to date with their annual eye exam?  Yes  Who is the provider or what is the name of the office in which the patient attends annual eye exams? Unable to tell her eye doctor's name If pt is not established with a provider, would they like to be referred to a provider to establish care? Yes .   Dental Screening: Recommended annual dental exams for proper oral hygiene  Community Resource Referral / Chronic Care Management: CRR required this visit?  No   CCM required this visit?  No      Plan:     I have personally reviewed and noted the following in the patient's chart:   Medical and social history Use of alcohol, tobacco or illicit drugs  Current medications and supplements including opioid prescriptions. Patient is currently taking opioid prescriptions. Information provided to patient regarding non-opioid alternatives. Patient advised to discuss non-opioid treatment plan with their provider. Functional ability and status Nutritional status Physical activity Advanced directives List of other physicians Hospitalizations, surgeries, and ER visits in previous 12 months Vitals Screenings to include cognitive, depression, and falls Referrals and appointments  In addition, I have reviewed and discussed with patient certain preventive protocols, quality metrics, and best practice recommendations. A written personalized care plan for preventive services as well as general preventive health recommendations were provided to patient.  Thomes Cake, [place]   12/15/2021   Nurse Notes: Pt has no concerns at this time. Advised that if anything changes to give our office a call. Office number was provided.

## 2021-12-15 NOTE — Telephone Encounter (Signed)
Called sister back. Reports pt has burning w/ urination. No blood. Sx started yesterday. Per Dr. Felecia Shelling, we will call in nitrofurantoin 100 mg #14, directions: 1 p.o. twice daily. If not better after a few days, would need to come in for UA/culture to further evaluate. She verbalized understanding and appreciation. I e-scribed rx.

## 2021-12-15 NOTE — Telephone Encounter (Signed)
Pt's sister states pt is having UTI symptoms, she'd like to know if Dr Felecia Shelling will call in the medication previllously called in, sister does not recall the name. Please call pt's sister.

## 2021-12-29 DIAGNOSIS — H524 Presbyopia: Secondary | ICD-10-CM | POA: Diagnosis not present

## 2021-12-29 DIAGNOSIS — H40013 Open angle with borderline findings, low risk, bilateral: Secondary | ICD-10-CM | POA: Diagnosis not present

## 2022-01-01 ENCOUNTER — Telehealth: Payer: Self-pay | Admitting: Internal Medicine

## 2022-01-01 ENCOUNTER — Other Ambulatory Visit: Payer: Self-pay | Admitting: Internal Medicine

## 2022-01-01 DIAGNOSIS — M159 Polyosteoarthritis, unspecified: Secondary | ICD-10-CM

## 2022-01-01 MED ORDER — IBUPROFEN 600 MG PO TABS: 600.0000 mg | ORAL_TABLET | Freq: Three times a day (TID) | ORAL | 0 refills | Status: DC | PRN

## 2022-01-01 NOTE — Telephone Encounter (Signed)
    Caller & Relationship to patient:Sister   Call back ZHQUIQ:[phone]   Date of last office visit:05/25/22   Date of next office visit:01/20/2022   Medication(s) to be refilled:  ibuprofen (ADVIL) 600 MG tablet      Preferred Pharmacy:

## 2022-01-20 ENCOUNTER — Ambulatory Visit (INDEPENDENT_AMBULATORY_CARE_PROVIDER_SITE_OTHER): Payer: Medicare Other | Admitting: Internal Medicine

## 2022-01-20 ENCOUNTER — Encounter: Payer: Self-pay | Admitting: Internal Medicine

## 2022-01-20 ENCOUNTER — Other Ambulatory Visit: Payer: Self-pay | Admitting: Obstetrics & Gynecology

## 2022-01-20 VITALS — BP 118/70 | HR 61 | Temp 98.5°F | Ht 67.0 in | Wt 160.0 lb

## 2022-01-20 DIAGNOSIS — F3341 Major depressive disorder, recurrent, in partial remission: Secondary | ICD-10-CM

## 2022-01-20 DIAGNOSIS — N912 Amenorrhea, unspecified: Secondary | ICD-10-CM

## 2022-01-20 DIAGNOSIS — Z01812 Encounter for preprocedural laboratory examination: Secondary | ICD-10-CM

## 2022-01-20 DIAGNOSIS — L219 Seborrheic dermatitis, unspecified: Secondary | ICD-10-CM | POA: Diagnosis not present

## 2022-01-20 DIAGNOSIS — E870 Hyperosmolality and hypernatremia: Secondary | ICD-10-CM

## 2022-01-20 DIAGNOSIS — F411 Generalized anxiety disorder: Secondary | ICD-10-CM | POA: Diagnosis not present

## 2022-01-20 LAB — BASIC METABOLIC PANEL
BUN: 25 mg/dL — ABNORMAL HIGH (ref 6–23)
CO2: 29 mEq/L (ref 19–32)
Calcium: 10.4 mg/dL (ref 8.4–10.5)
Chloride: 107 mEq/L (ref 96–112)
Creatinine, Ser: 0.93 mg/dL (ref 0.40–1.20)
GFR: 75.79 mL/min (ref >60.00)
Glucose, Bld: 77 mg/dL (ref 70–99)
Potassium: 3.6 mEq/L (ref 3.5–5.1)
Sodium: 143 mEq/L (ref 135–145)

## 2022-01-20 MED ORDER — TRAZODONE HCL 100 MG PO TABS: 200.0000 mg | ORAL_TABLET | Freq: Every evening | ORAL | 1 refills | Status: DC | PRN

## 2022-01-20 MED ORDER — KETOCONAZOLE 2 % EX SHAM: 1.0000 | MEDICATED_SHAMPOO | CUTANEOUS | 2 refills | Status: DC

## 2022-01-20 NOTE — Patient Instructions (Signed)
Hypernatremia Hypernatremia is an electrolyte disorder that occurs when the amount of salt (sodium) in a person's blood is too high. Electrolytes are minerals found in the body's blood and tissues. Sodium is an electrolyte that plays a role in many functions of the body. Electrolyte disorders occur when there is an imbalance between the amount of water and electrolytes in the blood, urine, and other body fluids. The balance of water and sodium in the body is vital to human function. When hypernatremia happens, the cells of the brain can become starved of water. Hypernatremia can happen to anyone, but it usually happens in people who may not drink enough fluids, such as older adults who may depend on others to get fluids. In severe cases, hypernatremia can be life-threatening. What are the causes? This condition may be caused by: Not drinking enough water. This may happen because of a decreased ability to feel thirst. Taking certain medicines, such as medicines that help remove water from the body (diuretics). Water loss through exercise or sweating. Diarrhea or vomiting. Excessive sodium intake, such as from taking salt tablets or salt water drowning. Hypernatremia may also be caused by other medical conditions, including: Conditions that affect a person's memory (dementia). A condition in which the kidneys remove too much urine from the body (polyuria). This is often related to diabetes. A condition in which the body produces too much of a hormone called cortisol (Cushing's syndrome). Other conditions that affect the balance of sodium and water in the body. Certain medical conditions that affect the kidneys. What are the signs or symptoms? Signs or symptoms of this condition may include: Increased thirst. You may not have this symptom if your ability to feel thirsty is decreased. Increased urination. Muscle twitching or cramps. Dizziness or light-headedness. Headache. Vomiting and  diarrhea. Generalized weakness. Severe symptoms may include: Confusion. Loss of consciousness. Seizures. Coma. How is this diagnosed? This condition is diagnosed based on: Your symptoms and medical history. Your health care provider will ask about: Your fluid intake. Recent occurrences of nausea and vomiting. Frequency of bowel movements. Frequency of urination. Medicines that you take. Difficulties with swallowing. A physical or neurological exam. Tests. These may include blood and urine tests. How is this treated? Depending on the severity, this condition may need to be treated right away. It often requires hospitalization. The goal of treatment is to balance the water and salt levels in your body and to find the underlying cause of your water loss. Treatment may be given through an IV or by mouth. Treatment may include the following: Increased water intake. IV fluids. Adjustment of medicines that you are taking. Fever management. Diabetes control. Ongoing lab tests will be done to help guide treatment options. Follow these instructions at home:  Drink enough fluid to keep your urine pale yellow. Take over-the-counter and prescription medicines only as told by your health care provider. Always drink fluids after exercising, vomiting, or having diarrhea. Keep all follow-up visits. This is important. Contact a health care provider if you: Have ongoing diarrhea or vomiting. Have a fever or chills. Are unable to follow recommendations from your health care provider about fluid intake. Get help right away if you: Are light-headed and weak. Have a fast heartbeat. Have problems breathing. Become confused or disoriented. Become irritable or restless. Have a seizure. Faint. Have signs of dehydration, such as: Dark urine, or very little to no urine. Cracked lips. No tears. Dry mouth. These symptoms may be an emergency. Get help right away.  Call 911. Do not wait to see if the  symptoms will go away. Do not drive yourself to the hospital. Summary Hypernatremia is when the amount of salt (sodium) in a person's blood is too high. When hypernatremia happens, the cells of the brain can become starved of water. Depending on the severity, this condition may need to be treated right away. It often requires hospitalization. The goal of treatment is to balance the water and salt levels in your body and to find the underlying cause of your water loss. This information is not intended to replace advice given to you by your health care provider. Make sure you discuss any questions you have with your health care provider. Document Revised: 01/07/2021 Document Reviewed: 01/07/2021 Elsevier Patient Education  [place].

## 2022-01-20 NOTE — Progress Notes (Signed)
Here for Depo Provera injection.   Injection was due before 01/13/22.  Patient was unable to collect urine specimen.  Patient left without getting specimen or getting injection.  (Unalbe to wait any longer).

## 2022-01-20 NOTE — Progress Notes (Unsigned)
Subjective:  Patient ID: Mary Jackson, female    DOB: 1981/04/29  Age: 41 y.o. MRN: [id number]  CC: Rash   HPI Mary Jackson presents for f/up -  She continues to complain of itchy scalp rash with scaling.  She is not using Olux foam.  She would rather use a shampoo.  Continues to struggle with anxiety and would like to increase the dose of trazodone.  Outpatient Medications Prior to Visit  Medication Sig Dispense Refill   amantadine (SYMMETREL) 100 MG capsule Take 1 capsule (100 mg total) by mouth 2 (two) times daily. 60 capsule 11   Ascorbic Acid (VITAMIN C PO) Take 1,000 mg by mouth daily.     baclofen (LIORESAL) 10 MG tablet Take 1 tablet (10 mg total) by mouth 3 (three) times daily. 270 tablet 1   Cholecalciferol (VITAMIN D3 PO) Take 5,000 Units by mouth daily.     clobetasol (OLUX) 0.05 % topical foam Apply topically 2 (two) times daily. 100 g 3   dalfampridine 10 MG TB12 Take 1 tablet (10 mg total) by mouth 2 (two) times daily. 60 tablet 11   ferrous sulfate 324 MG TBEC Take 324 mg by mouth.     gabapentin (NEURONTIN) 300 MG capsule Take 1 capsule (300 mg total) by mouth 3 (three) times daily. 270 capsule 3   ibuprofen (ADVIL) 600 MG tablet Take 1 tablet (600 mg total) by mouth every 8 (eight) hours as needed for moderate pain. 270 tablet 0   medroxyPROGESTERone (DEPO-PROVERA) 150 MG/ML injection Inject 1 mL (150 mg total) into the muscle every 3 (three) months. 1 mL 4   traZODone (DESYREL) 100 MG tablet      No facility-administered medications prior to visit.    ROS Review of Systems  Constitutional: Negative.  Negative for chills, diaphoresis, fatigue and fever.  HENT: Negative.    Eyes: Negative.   Respiratory:  Negative for cough, chest tightness, shortness of breath and wheezing.   Cardiovascular:  Negative for chest pain, palpitations and leg swelling.  Gastrointestinal:  Negative for abdominal pain, constipation, diarrhea, nausea and vomiting.   Endocrine: Negative.   Genitourinary: Negative.  Negative for difficulty urinating.  Musculoskeletal:  Positive for arthralgias and gait problem. Negative for back pain.  Skin:  Positive for rash.  Neurological:  Negative for dizziness, weakness and headaches.  Hematological:  Negative for adenopathy. Does not bruise/bleed easily.  Psychiatric/Behavioral:  Positive for dysphoric mood. Negative for decreased concentration. The patient is nervous/anxious.     Objective:  BP 118/70 (BP Location: Left Arm, Patient Position: Sitting, Cuff Size: Normal)   Pulse 61   Temp 98.5 F (36.9 C) (Oral)   Ht '5\' 7"'$  (1.702 m)   Wt 160 lb (72.6 kg)   SpO2 98%   BMI 25.06 kg/m   BP Readings from Last 3 Encounters:  01/20/22 118/70  11/10/21 103/63  07/21/21 112/72    Wt Readings from Last 3 Encounters:  01/20/22 160 lb (72.6 kg)  11/10/21 171 lb (77.6 kg)  07/21/21 172 lb (78 kg)    Physical Exam Vitals reviewed.  HENT:     Nose: Nose normal.     Mouth/Throat:     Mouth: Mucous membranes are moist.  Eyes:     General: No scleral icterus.    Conjunctiva/sclera: Conjunctivae normal.  Cardiovascular:     Rate and Rhythm: Normal rate.     Heart sounds: No murmur heard. Pulmonary:  Effort: Pulmonary effort is normal.     Breath sounds: No stridor. No wheezing, rhonchi or rales.  Abdominal:     Palpations: There is no mass.     Tenderness: There is no abdominal tenderness. There is no guarding.     Hernia: No hernia is present.  Musculoskeletal:        General: Deformity (djd) present. No swelling.     Cervical back: Neck supple.     Right lower leg: No edema.     Left lower leg: No edema.  Lymphadenopathy:     Cervical: No cervical adenopathy.  Skin:    General: Skin is warm.     Coloration: Skin is not jaundiced.     Findings: Rash present.  Neurological:     Mental Status: She is alert. Mental status is at baseline.     Cranial Nerves: No cranial nerve deficit.      Sensory: Sensory deficit present.     Motor: Weakness present.     Coordination: Coordination abnormal.     Gait: Gait abnormal.     Deep Tendon Reflexes: Reflexes abnormal.     Lab Results  Component Value Date   WBC 6.5 11/10/2021   HGB 12.1 11/10/2021   HCT 37.6 11/10/2021   PLT 330 11/10/2021   GLUCOSE 63 (L) 11/10/2021   CHOL 168 12/03/2020   TRIG 51.0 12/03/2020   HDL 69.90 12/03/2020   LDLCALC 88 12/03/2020   ALT 37 (H) 11/10/2021   AST 28 11/10/2021   NA 145 (H) 11/10/2021   K 4.3 11/10/2021   CL 107 (H) 11/10/2021   CREATININE 0.64 11/10/2021   BUN 16 11/10/2021   CO2 24 11/10/2021   TSH 0.73 12/03/2020    MM Breast Surgical Specimen  Result Date: 06/17/2021 CLINICAL DATA:  Post surgical excision of a right breast lesion following radioactive seed localization. Assess specimen. EXAM: SPECIMEN RADIOGRAPH OF THE RIGHT BREAST COMPARISON:  Previous exam(s). FINDINGS: Status post excision of the right breast. The radioactive seed and biopsy marker clip are present, completely intact, and were marked for pathology. IMPRESSION: Specimen radiograph of the right breast. Electronically Signed   By: Lajean Manes M.D.   On: 06/17/2021 09:01   Assessment & Plan:   Mary Jackson was seen today for rash.  Diagnoses and all orders for this visit:  Recurrent major depressive disorder, in partial remission ([place]) -     traZODone (DESYREL) 100 MG tablet; Take 2 tablets (200 mg total) by mouth at bedtime as needed for sleep.  GAD (generalized anxiety disorder) -     traZODone (DESYREL) 100 MG tablet; Take 2 tablets (200 mg total) by mouth at bedtime as needed for sleep.  Chronic hypernatremia- Her sodium is normal now. -     Basic metabolic panel; Future -     Basic metabolic panel  Seborrheic dermatitis of scalp -     ketoconazole (NIZORAL) 2 % shampoo; Apply 1 Application topically 2 (two) times a week.   I have changed Mary Jackson's traZODone. I am also having her start  on ketoconazole. Additionally, I am having her maintain her ferrous sulfate, Cholecalciferol (VITAMIN D3 PO), Ascorbic Acid (VITAMIN C PO), medroxyPROGESTERone, clobetasol, baclofen, dalfampridine, gabapentin, amantadine, and ibuprofen.  Meds ordered this encounter  Medications   traZODone (DESYREL) 100 MG tablet    Sig: Take 2 tablets (200 mg total) by mouth at bedtime as needed for sleep.    Dispense:  180 tablet    Refill:  1   ketoconazole (NIZORAL) 2 % shampoo    Sig: Apply 1 Application topically 2 (two) times a week.    Dispense:  120 mL    Refill:  2     Follow-up: Return in about 6 months (around 07/23/2022).  Scarlette Calico, MD

## 2022-03-09 DIAGNOSIS — G35 Multiple sclerosis: Secondary | ICD-10-CM | POA: Diagnosis not present

## 2022-03-30 ENCOUNTER — Ambulatory Visit: Payer: Medicare Other

## 2022-04-06 ENCOUNTER — Ambulatory Visit
Admission: RE | Admit: 2022-04-06 | Discharge: 2022-04-06 | Disposition: A | Discharge status: Home / Self Care | Payer: Medicare Other | Source: Ambulatory Visit | Attending: Internal Medicine | Admitting: Internal Medicine

## 2022-04-06 ENCOUNTER — Other Ambulatory Visit: Payer: Self-pay | Admitting: General Surgery

## 2022-04-06 DIAGNOSIS — Z0000 Encounter for general adult medical examination without abnormal findings: Secondary | ICD-10-CM

## 2022-04-06 DIAGNOSIS — Z9889 Other specified postprocedural states: Secondary | ICD-10-CM

## 2022-04-13 ENCOUNTER — Ambulatory Visit
Admission: RE | Admit: 2022-04-13 | Discharge: 2022-04-13 | Disposition: A | Discharge status: Home / Self Care | Payer: Medicare Other | Source: Ambulatory Visit | Attending: General Surgery | Admitting: General Surgery

## 2022-04-13 ENCOUNTER — Other Ambulatory Visit: Payer: Self-pay | Admitting: General Surgery

## 2022-04-13 DIAGNOSIS — R2231 Localized swelling, mass and lump, right upper limb: Secondary | ICD-10-CM

## 2022-04-13 DIAGNOSIS — R928 Other abnormal and inconclusive findings on diagnostic imaging of breast: Secondary | ICD-10-CM | POA: Diagnosis not present

## 2022-04-13 DIAGNOSIS — N6489 Other specified disorders of breast: Secondary | ICD-10-CM | POA: Diagnosis not present

## 2022-04-13 DIAGNOSIS — Z9889 Other specified postprocedural states: Secondary | ICD-10-CM

## 2022-05-12 DIAGNOSIS — H401131 Primary open-angle glaucoma, bilateral, mild stage: Secondary | ICD-10-CM | POA: Diagnosis not present

## 2022-05-14 ENCOUNTER — Telehealth: Payer: Self-pay | Admitting: Family Medicine

## 2022-05-14 NOTE — Telephone Encounter (Signed)
Called to remind patient of appointment with Amy and her sister(is on the DPR) asked if she still got a discount for her medication used for infusion. She wanted to know if she had to re apply for 2024 to get a discount. It is some sort of assistance but she can't remember exactly where it's from. I tried to look in the chart wasn't sure what I was looking for. Can you advise patients sister please. Thank you

## 2022-05-18 NOTE — Telephone Encounter (Signed)
Called the sister back to inform her that Mary Jackson would be the one to advise on how to move forward with pt assistance. Provided her the phone number to [facility] to call and ask. Their # is [phone]. Pt's sister was appreciative for the call back.

## 2022-05-22 NOTE — Progress Notes (Unsigned)
PATIENT: Mary Jackson DOB: Dec 28, 1980  REASON FOR VISIT: follow up HISTORY FROM: patient  No chief complaint on file.    HISTORY OF PRESENT ILLNESS:  05/22/22 ALL:  She returns for follow up for relapsing/active secondary progressive MS. She continues Ocrevus infusions q6w. MRI brain and cervical spine were stable 04/2020.   She feels that MS is stable. No changes since last being seen. Gait is stable with Rolator. No falls. She has bilateral lower weakness. She would like to work with PT as this has helped with strengthening in the past. No worsening pain. Dysesthesias was and wane. She continues Ampyra BID, amantadine '100mg'$  BID, gabapentin '300mg'$  TID and baclofen TID.   She is sleeping well. Trazodone??? makes her too sleepy. She has tried taking 1/2 tablet. Mood is good. She continues sertraline and tolerating well.   She is taking vitamin D 5,000iu daily.    History (copied from Dr Garth Bigness previous note):  Mary Jackson is a 41 y.o. woman with relapsing/active form of secondary progressive multiple sclerosis.   Update 11/10/2021: Due to converting to JCV Ab positive, she switched from Tysabri to [place].   She feels a little weaker andmore tired on Ocrevus than she did on Tysabri    Her infusion was 09/15/220.  Marland Kitchen  JCV antibody 03/25/2020 was 1.43 and 1.37 a second tine 03/2021 (middle positive)    This mpies a higher risk of PML compared to when she was negative (risk is about 1/300)   She uses a walker due to right > left  leg weakness.  She has a right foot drop.  She sometimes stumbles due to the foot drop.   Ampyra bid helps her some.   She has ataxia in her legs.   Baclofen has helped her spasms and gabapentin tid has helped her dysesthesias.      She has urinary urgency, worse the last few days.  She also feels more tired associated with a urinary urgency..   She has reduced vision OD and colors are desaturated OD.    Sometimes she has diplopia.       She is often  fatigued.  She has trouble falling asleep and seldom goes to sleep before 1 am .  She does not snore.    She has had some depression, helped by sertraline.    She has seen psychology/counseling in the past for depression, stress management and other issues.      MS History:   She was diagnosed with MS in 2008 after presenting with left leg weakness and clumsiness and a fall.  Imaging studies were performed and she was diagnosed.   She was initially placed on Copaxone but she had multiple relapses.   She was placed on Tysabri and is now on every 6 weeks.    Imaging:  MRI's of the brain dated 4/2 2019 and 04/09/2016.  The MRI showed multiple T2/flair hyperintense foci with single and confluent lesions in the periventricular, juxtacortical and deep white matter and also is a few foci in the infratentorial white matter and apparently at the cervical medullary junction.     We do not have an MRI of the cervical spine.  MRI report from 2015 did mention that there were new lesions compared to the previous MRI.   MRI of the brain 04/29/2020 showed Multiple single and confluent T2/FLAIR hyperintense foci in the hemispheres and a few foci in the cerebellum in a pattern configuration consistent with advanced chronic demyelinating plaque  related to multiple sclerosis.  None of the foci appear to be acute and there were no changes compared to the 2019 MRI.     Generalized cortical and corpus callosum atrophy.   MRI of the cervical spine 04/29/2020 showed patchy T2 hyperintense signal throughout the cervical spinal cord.  Mild multilevel degenerative changes but no spinal stenosis or nerve root compression   She has no FH of MS.   Parents have HTN.   REVIEW OF SYSTEMS: Out of a complete 14 system review of symptoms, the patient complains only of the following symptoms, chronic pain, numbness and tingling, imbalance and all other reviewed systems are negative.  ALLERGIES: No Known Allergies  HOME  MEDICATIONS: Outpatient Medications Prior to Visit  Medication Sig Dispense Refill   amantadine (SYMMETREL) 100 MG capsule Take 1 capsule (100 mg total) by mouth 2 (two) times daily. 60 capsule 11   Ascorbic Acid (VITAMIN C PO) Take 1,000 mg by mouth daily.     baclofen (LIORESAL) 10 MG tablet Take 1 tablet (10 mg total) by mouth 3 (three) times daily. 270 tablet 1   Cholecalciferol (VITAMIN D3 PO) Take 5,000 Units by mouth daily.     clobetasol (OLUX) 0.05 % topical foam Apply topically 2 (two) times daily. 100 g 3   dalfampridine 10 MG TB12 Take 1 tablet (10 mg total) by mouth 2 (two) times daily. 60 tablet 11   ferrous sulfate 324 MG TBEC Take 324 mg by mouth.     gabapentin (NEURONTIN) 300 MG capsule Take 1 capsule (300 mg total) by mouth 3 (three) times daily. 270 capsule 3   ibuprofen (ADVIL) 600 MG tablet Take 1 tablet (600 mg total) by mouth every 8 (eight) hours as needed for moderate pain. 270 tablet 0   ketoconazole (NIZORAL) 2 % shampoo Apply 1 Application topically 2 (two) times a week. 120 mL 2   medroxyPROGESTERone (DEPO-PROVERA) 150 MG/ML injection Inject 1 mL (150 mg total) into the muscle every 3 (three) months. 1 mL 4   traZODone (DESYREL) 100 MG tablet Take 2 tablets (200 mg total) by mouth at bedtime as needed for sleep. 180 tablet 1   No facility-administered medications prior to visit.    PAST MEDICAL HISTORY: Past Medical History:  Diagnosis Date   Depression with anxiety    Migraines    MS (multiple sclerosis) ([place])     PAST SURGICAL HISTORY: Past Surgical History:  Procedure Laterality Date   BREAST BIOPSY Right 03/2021   BREAST EXCISIONAL BIOPSY Right 06/17/2021   BREAST LUMPECTOMY WITH RADIOACTIVE SEED LOCALIZATION Right 06/17/2021   Procedure: RIGHT BREAST LUMPECTOMY WITH RADIOACTIVE SEED LOCALIZATION;  Surgeon: Jovita Kussmaul, MD;  Location: [facility];  Service: General;  Laterality: Right;    FAMILY HISTORY: Family History  Problem  Relation Age of Onset   Hypertension Sister    Diabetes Sister     SOCIAL HISTORY: Social History   Socioeconomic History   Marital status: Single    Spouse name: Not on file   Number of children: Not on file   Years of education: Not on file   Highest education level: Not on file  Occupational History   Not on file  Tobacco Use   Smoking status: Every Day    Types: Cigarettes   Smokeless tobacco: Never   Tobacco comments:    3-4 cigs per day  Substance and Sexual Activity   Alcohol use: Yes   Drug use: Not Currently  Comment: occ   Sexual activity: Not Currently    Partners: Male    Birth control/protection: Abstinence  Other Topics Concern   Not on file  Social History Narrative   Right handed   Caffeine use: coffee, tea, soda   Lives with sister, Rollene Fare   Social Determinants of Health   Financial Resource Strain: Low Risk  (12/15/2021)   Overall Financial Resource Strain (CARDIA)    Difficulty of Paying Living Expenses: Not hard at all  Food Insecurity: No Food Insecurity (12/15/2021)   Hunger Vital Sign    Worried About Running Out of Food in the Last Year: Never true    Ran Out of Food in the Last Year: Never true  Transportation Needs: No Transportation Needs (12/15/2021)   PRAPARE - Hydrologist (Medical): No    Lack of Transportation (Non-Medical): No  Physical Activity: Inactive (12/15/2021)   Exercise Vital Sign    Days of Exercise per Week: 0 days    Minutes of Exercise per Session: 0 min  Stress: No Stress Concern Present (12/15/2021)   [place]    Feeling of Stress : Not at all  Social Connections: Moderately Integrated (12/15/2021)   Social Connection and Isolation Panel [NHANES]    Frequency of Communication with Friends and Family: Three times a week    Frequency of Social Gatherings with Friends and Family: Once a week    Attends Religious Services: 1 to 4  times per year    Active Member of [employer] or Organizations: Yes    Attends Archivist Meetings: 1 to 4 times per year    Marital Status: Never married  Intimate Partner Violence: Not At Risk (12/15/2021)   Humiliation, Afraid, Rape, and Kick questionnaire    Fear of Current or Ex-Partner: No    Emotionally Abused: No    Physically Abused: No    Sexually Abused: No      PHYSICAL EXAM  There were no vitals filed for this visit.  There is no height or weight on file to calculate BMI.  Generalized: Well developed, in no acute distress  Cardiology: normal rate and rhythm, no murmur noted Neurological examination  Mentation: Alert oriented to time, place, history taking. Follows all commands speech and language fluent Cranial nerve II-XII: Pupils were equal round reactive to light. Extraocular movements were full, visual field were full on confrontational test. Facial sensation and strength were normal. Head turning and shoulder shrug  were normal and symmetric. Motor: The motor testing reveals 5 over 5 strength of upper extremities, 4/5 bilateral lowers. Good symmetric motor tone is noted throughout.  Sensory: Sensory testing is intact to soft touch on all 4 extremities. No evidence of extinction is noted.  Coordination: Cerebellar testing reveals mildly ataxic finger-nose-finger (L>R) and reduced heel-to-shin bilaterally.  Gait and station: Gait is ataxic, stable with Rolator, left leg drags Reflexes: Deep tendon reflexes are symmetric, brisk   DIAGNOSTIC DATA (LABS, IMAGING, TESTING) - I reviewed patient records, labs, notes, testing and imaging myself where available.      No data to display           Lab Results  Component Value Date   WBC 6.5 11/10/2021   HGB 12.1 11/10/2021   HCT 37.6 11/10/2021   MCV 81 11/10/2021   PLT 330 11/10/2021      Component Value Date/Time   NA 143 01/20/2022 1100   NA 145 (  H) 11/10/2021 1402   K 3.6 01/20/2022 1100   CL 107  01/20/2022 1100   CO2 29 01/20/2022 1100   GLUCOSE 77 01/20/2022 1100   BUN 25 (H) 01/20/2022 1100   BUN 16 11/10/2021 1402   CREATININE 0.93 01/20/2022 1100   CALCIUM 10.4 01/20/2022 1100   PROT 7.8 11/10/2021 1402   ALBUMIN 4.3 11/10/2021 1402   AST 28 11/10/2021 1402   ALT 37 (H) 11/10/2021 1402   ALKPHOS 73 11/10/2021 1402   BILITOT 0.3 11/10/2021 1402   GFRNONAA 103 10/02/2019 1008   GFRAA 119 10/02/2019 1008   Lab Results  Component Value Date   CHOL 168 12/03/2020   HDL 69.90 12/03/2020   LDLCALC 88 12/03/2020   TRIG 51.0 12/03/2020   CHOLHDL 2 12/03/2020   No results found for: "HGBA1C" Lab Results  Component Value Date   JSRPRXYV85 929 12/03/2020   Lab Results  Component Value Date   TSH 0.73 12/03/2020       ASSESSMENT AND PLAN 41 y.o. year old female  has a past medical history of Depression with anxiety, Migraines, and MS (multiple sclerosis) ([place]). here with   No diagnosis found.    She is doing well today. MS is stable. She will continue Tysabri infusions every 6 weeks. Continue Ampyra for gait, Zoloft for mood, gabapentin for dysesthesias and baclofen for muscle spasms. We will update labs today. I will send referral for PT.  I feel she would benefit from continued therapy. Safety and fall precautions reviewed. She was encouraged to stay physically active. She will follow up in 6 months, sooner if needed.    No orders of the defined types were placed in this encounter.    No orders of the defined types were placed in this encounter.     I spent 30 minutes with the patient. 50% of this time was spent counseling and educating patient on plan of care and medications.    Bradi Arbuthnot, FNP-C 05/22/2022, 9:48 AM [facility] [address], [place], [place] [address] [phone]

## 2022-05-22 NOTE — Patient Instructions (Signed)

## 2022-05-25 ENCOUNTER — Ambulatory Visit (INDEPENDENT_AMBULATORY_CARE_PROVIDER_SITE_OTHER): Payer: Medicare Other | Admitting: Family Medicine

## 2022-05-25 ENCOUNTER — Encounter: Payer: Self-pay | Admitting: Family Medicine

## 2022-05-25 VITALS — BP 106/63 | HR 83 | Ht 67.0 in | Wt 158.0 lb

## 2022-05-25 DIAGNOSIS — F32A Depression, unspecified: Secondary | ICD-10-CM | POA: Diagnosis not present

## 2022-05-25 DIAGNOSIS — G35 Multiple sclerosis: Secondary | ICD-10-CM

## 2022-05-25 DIAGNOSIS — Z79899 Other long term (current) drug therapy: Secondary | ICD-10-CM

## 2022-05-25 DIAGNOSIS — M21371 Foot drop, right foot: Secondary | ICD-10-CM

## 2022-05-25 DIAGNOSIS — R260 Ataxic gait: Secondary | ICD-10-CM | POA: Diagnosis not present

## 2022-05-25 DIAGNOSIS — E559 Vitamin D deficiency, unspecified: Secondary | ICD-10-CM | POA: Diagnosis not present

## 2022-05-26 ENCOUNTER — Telehealth: Payer: Self-pay | Admitting: *Deleted

## 2022-05-26 LAB — CBC WITH DIFFERENTIAL/PLATELET
Basophils Absolute: 0.1 10*3/uL (ref 0.0–0.2)
Basos: 1 %
EOS (ABSOLUTE): 0.1 10*3/uL (ref 0.0–0.4)
Eos: 1 %
Hematocrit: 39.3 % (ref 34.0–46.6)
Hemoglobin: 12.7 g/dL (ref 11.1–15.9)
Immature Grans (Abs): 0 10*3/uL (ref 0.0–0.1)
Immature Granulocytes: 0 %
Lymphocytes Absolute: 1.6 10*3/uL (ref 0.7–3.1)
Lymphs: 24 %
MCH: 27.1 pg (ref 26.6–33.0)
MCHC: 32.3 g/dL (ref 31.5–35.7)
MCV: 84 fL (ref 79–97)
Monocytes Absolute: 0.5 10*3/uL (ref 0.1–0.9)
Monocytes: 8 %
Neutrophils Absolute: 4.5 10*3/uL (ref 1.4–7.0)
Neutrophils: 66 %
Platelets: 261 10*3/uL (ref 150–450)
RBC: 4.68 x10E6/uL (ref 3.77–5.28)
RDW: 13.2 % (ref 11.7–15.4)
WBC: 6.7 10*3/uL (ref 3.4–10.8)

## 2022-05-26 LAB — VITAMIN D 25 HYDROXY (VIT D DEFICIENCY, FRACTURES): Vit D, 25-Hydroxy: 79.1 ng/mL (ref 30.0–100.0)

## 2022-05-26 LAB — IGG, IGA, IGM
IgA/Immunoglobulin A, Serum: 353 mg/dL — ABNORMAL HIGH (ref 87–352)
IgG (Immunoglobin G), Serum: 1266 mg/dL (ref 586–1602)
IgM (Immunoglobulin M), Srm: 61 mg/dL (ref 26–217)

## 2022-05-26 NOTE — Telephone Encounter (Signed)
Called and spoke with sister/pt. Relayed results per AL,NP note. They verbalized understanding. Confirmed she is taking Vit D 5000U po qd and sertraline '100mg'$  po qd. I updated med list.

## 2022-05-26 NOTE — Telephone Encounter (Signed)
-----   Message from Debbora Presto, NP sent at 05/26/2022  8:14 AM EST ----- Please let her know that her labs look good. Her vitamin D was normal. She can continue OTC vitamin D at dosage she is taking now. Please confirm medication list with her. She was confused about doses of vitamin D and sertraline at visit. TY.

## 2022-06-09 DIAGNOSIS — H401111 Primary open-angle glaucoma, right eye, mild stage: Secondary | ICD-10-CM | POA: Diagnosis not present

## 2022-06-16 DIAGNOSIS — H401131 Primary open-angle glaucoma, bilateral, mild stage: Secondary | ICD-10-CM | POA: Diagnosis not present

## 2022-06-16 DIAGNOSIS — H04123 Dry eye syndrome of bilateral lacrimal glands: Secondary | ICD-10-CM | POA: Diagnosis not present

## 2022-06-22 DIAGNOSIS — H401121 Primary open-angle glaucoma, left eye, mild stage: Secondary | ICD-10-CM | POA: Diagnosis not present

## 2022-07-27 DIAGNOSIS — H401131 Primary open-angle glaucoma, bilateral, mild stage: Secondary | ICD-10-CM | POA: Diagnosis not present

## 2022-08-05 ENCOUNTER — Telehealth: Payer: Self-pay | Admitting: Internal Medicine

## 2022-08-05 NOTE — Telephone Encounter (Signed)
Patients sister Rollene Fare called and said patient had received a summons for court. She was wanting to know if Dr Ronnald Ramp can write a excusal letter for patient so she doesn't have to do it. She  said because she walks with a walker and has bouts of confusion. Call back is [phone]

## 2022-08-19 ENCOUNTER — Encounter: Payer: Self-pay | Admitting: *Deleted

## 2022-08-20 ENCOUNTER — Telehealth: Payer: Self-pay | Admitting: *Deleted

## 2022-08-20 NOTE — Telephone Encounter (Signed)
Called patient to relay Mary Jackson printed letter excusing her from [employer] on 09/22/22. Pt sister Rollene Fare picked up ( has DPR access) I informed Rollene Fare that letter is ready for pick up at check in desk, must pick up before [place] said it will probably be Monday when she can pick up.

## 2022-08-26 ENCOUNTER — Other Ambulatory Visit: Payer: Self-pay | Admitting: Neurology

## 2022-09-04 ENCOUNTER — Other Ambulatory Visit: Payer: Self-pay | Admitting: Neurology

## 2022-09-07 NOTE — Telephone Encounter (Signed)
Last seen on 11/2021 Follow up scheduled on 12/01/22

## 2022-09-11 ENCOUNTER — Other Ambulatory Visit: Payer: Self-pay | Admitting: Neurology

## 2022-09-14 DIAGNOSIS — G35 Multiple sclerosis: Secondary | ICD-10-CM | POA: Diagnosis not present

## 2022-09-14 NOTE — Telephone Encounter (Signed)
Follow up scheduled on 12/01/22

## 2022-10-03 ENCOUNTER — Other Ambulatory Visit: Payer: Self-pay | Admitting: Internal Medicine

## 2022-10-03 DIAGNOSIS — F3341 Major depressive disorder, recurrent, in partial remission: Secondary | ICD-10-CM

## 2022-10-03 DIAGNOSIS — F411 Generalized anxiety disorder: Secondary | ICD-10-CM

## 2022-10-03 IMAGING — DX DG CERVICAL SPINE COMPLETE 4+V
5 series · 5 of 5 positions shown · non-contrast
Comparison: None.

CLINICAL DATA: Chronic neck pain.

EXAM:
CERVICAL SPINE - COMPLETE 4+ VIEW

[c-spine lat]
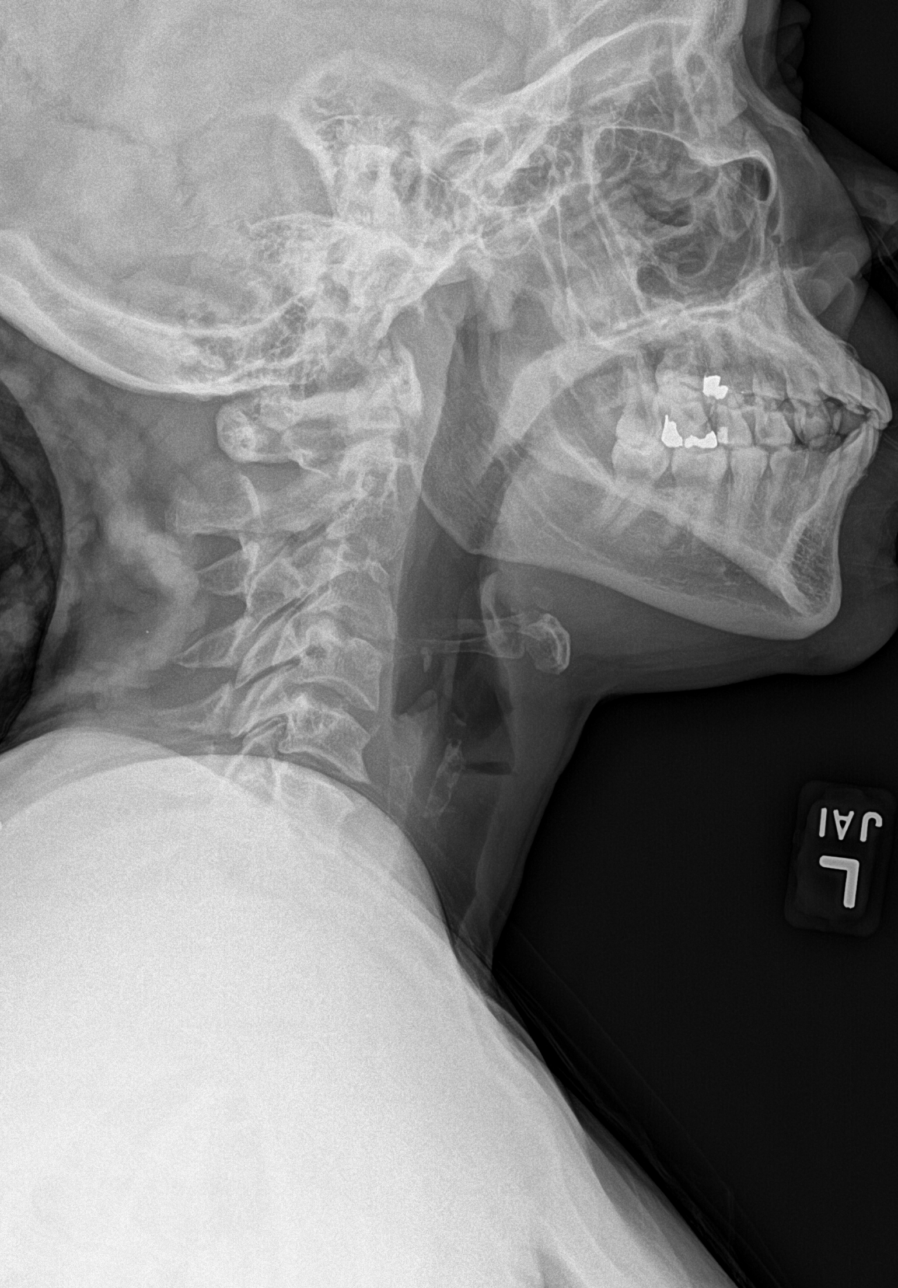

[c-spine obl (1 of 2)]
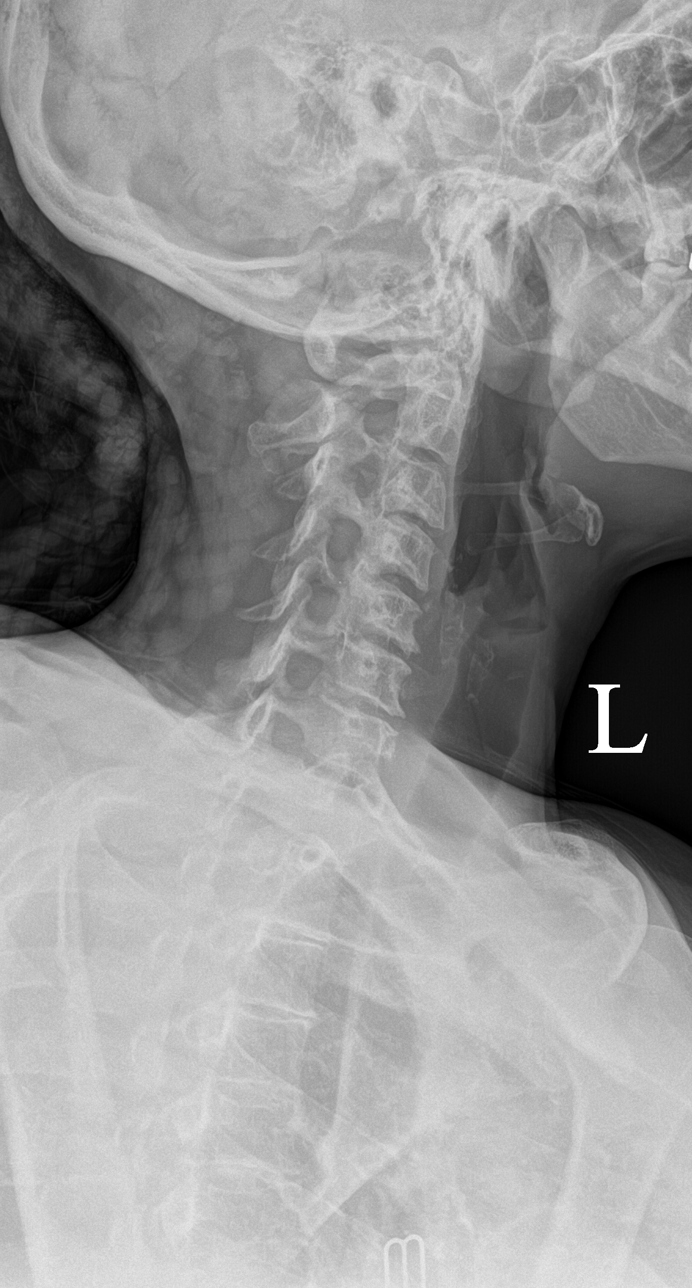

[c-spine obl (2 of 2)]
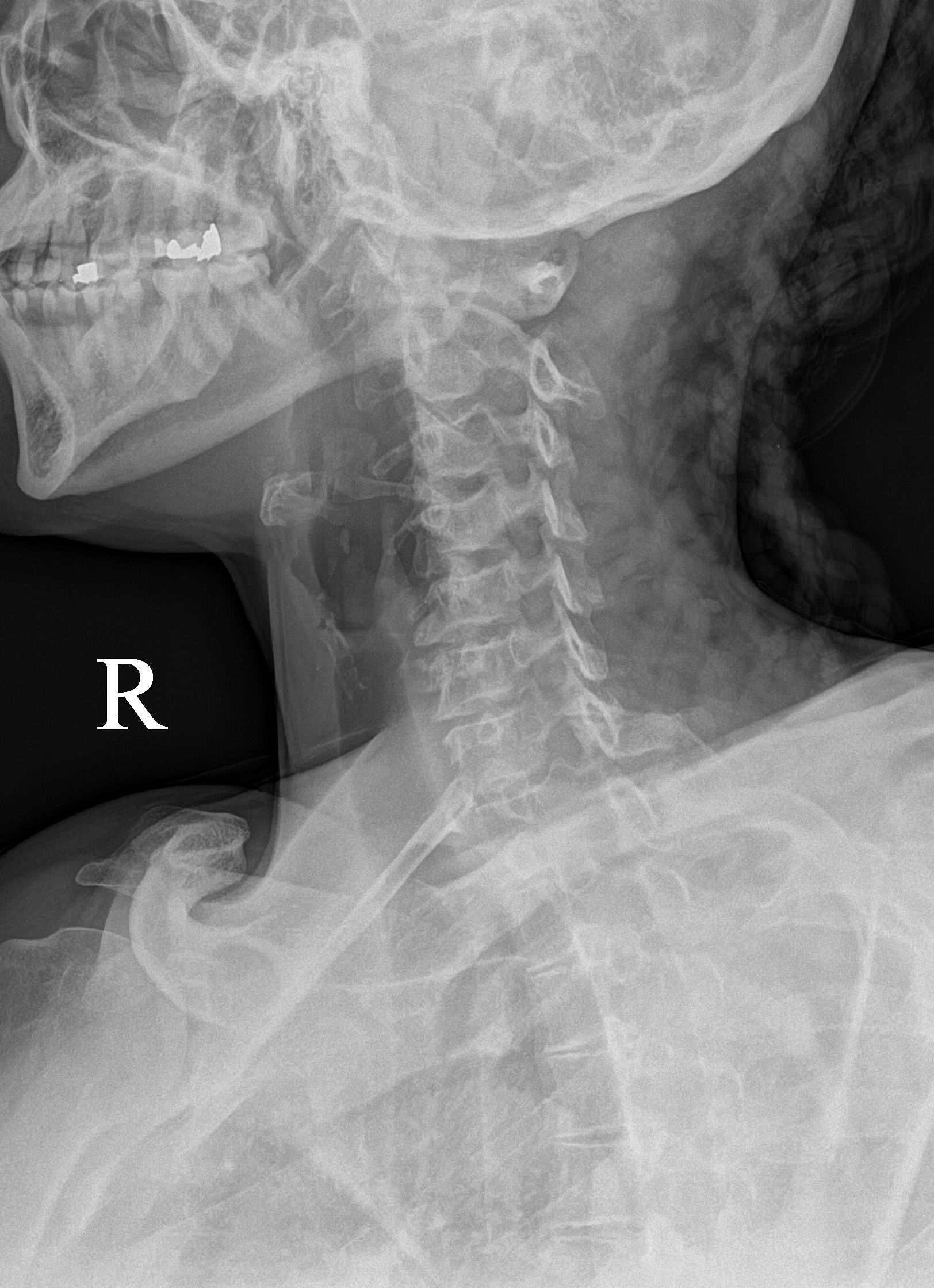

[c-spine ap]
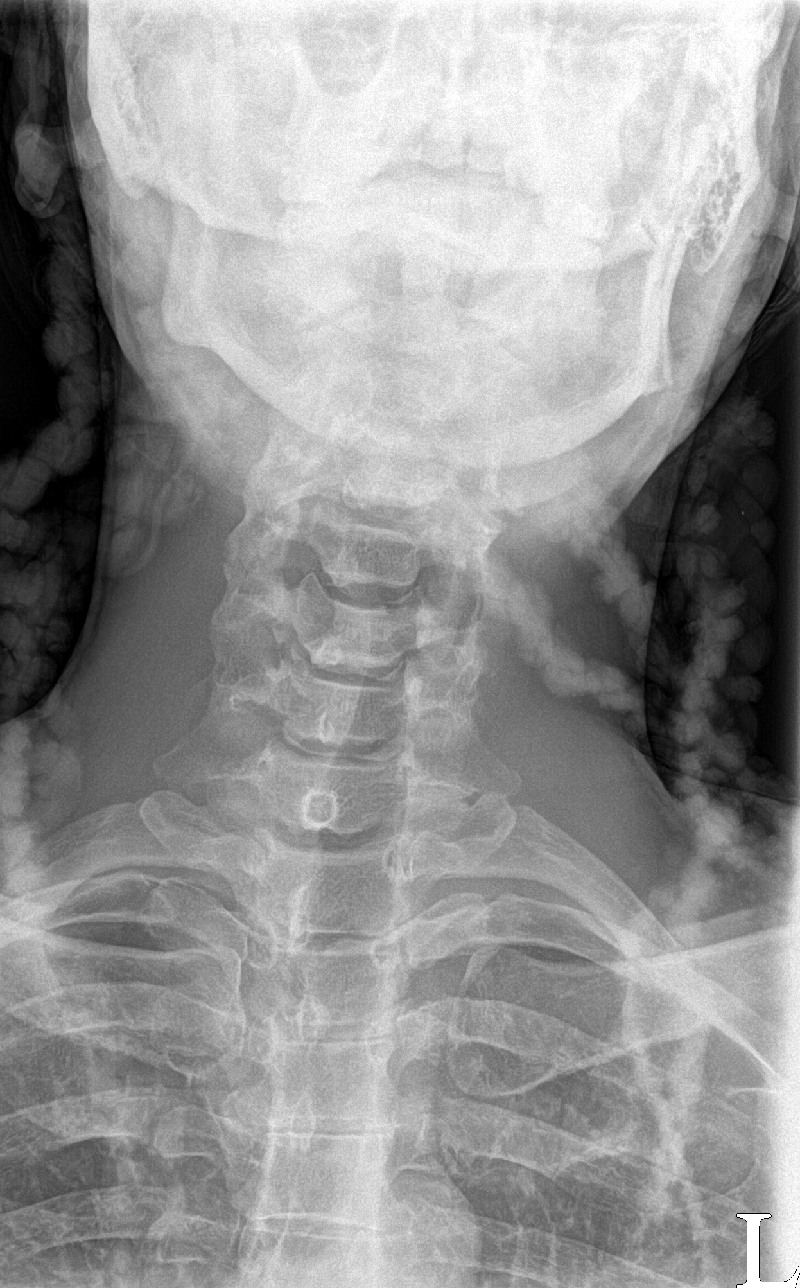

[c-spine open mouth]
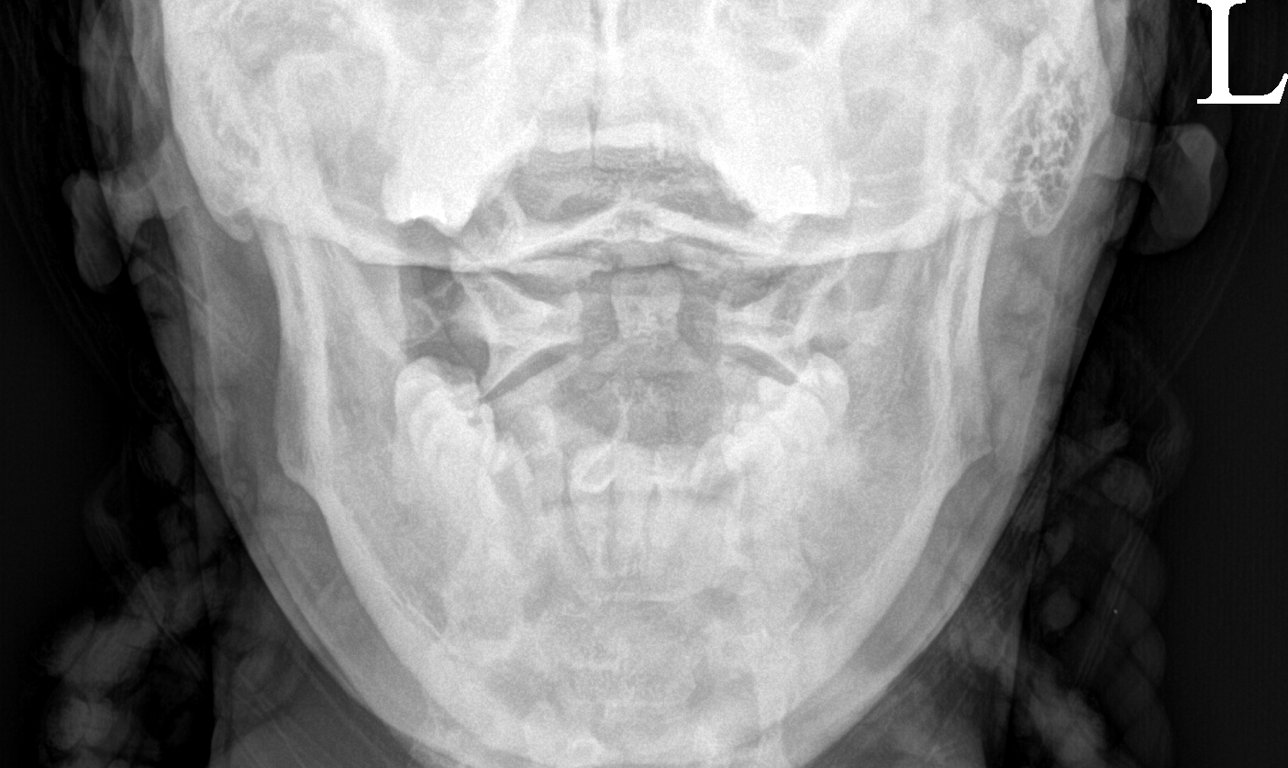

[5 of 5 positions shown; findings below may reference images not displayed]

FINDINGS: No fracture or spondylolisthesis is noted. Mild anterior osteophyte
formation is noted at C5-6 and C6-7. No neural foraminal stenosis is
noted.
IMPRESSION: Mild multilevel degenerative disc disease. No acute abnormality
seen.

## 2022-10-16 IMAGING — DX DG KNEE AP/LAT W/ SUNRISE*R*
3 series · 3 of 3 positions shown · non-contrast
Comparison: No recent prior.

CLINICAL DATA: Right knee pain.

EXAM:
RIGHT KNEE 3 VIEWS

[knee ap]
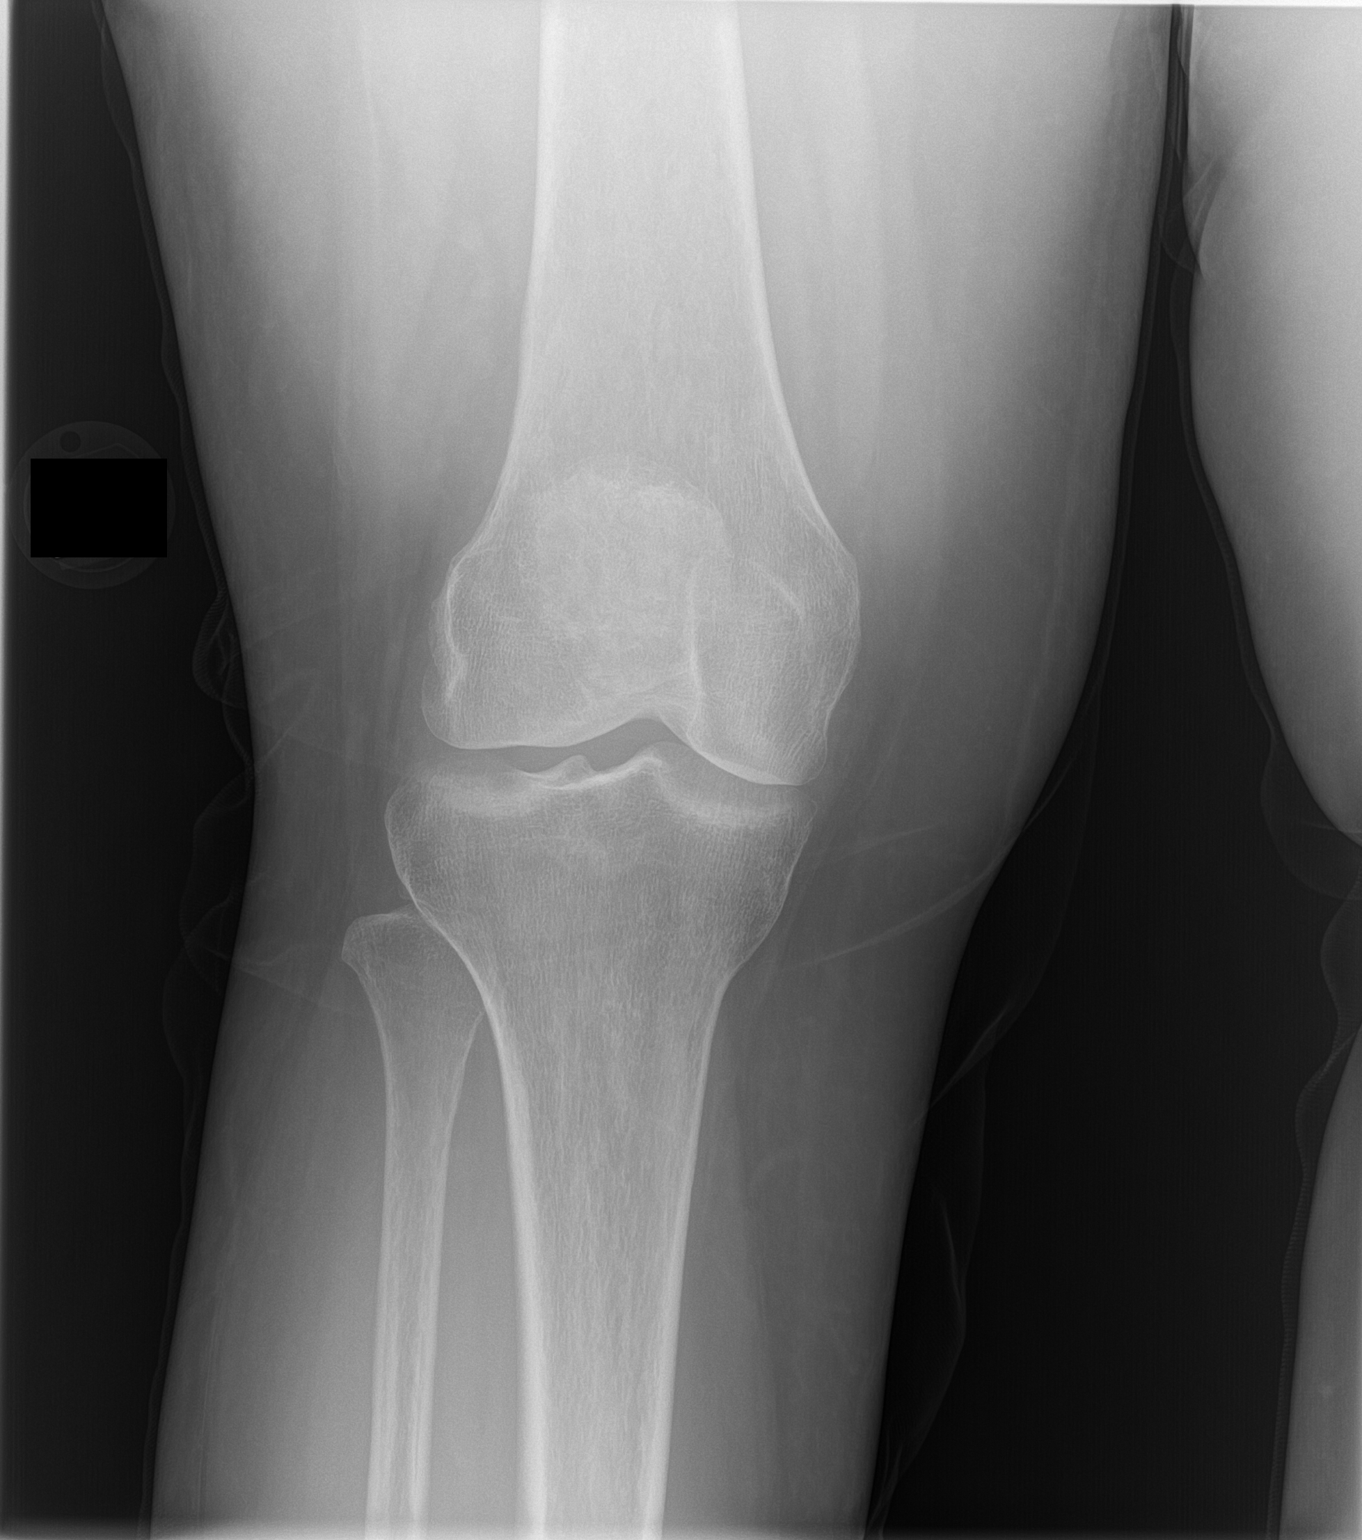

[knee lat]
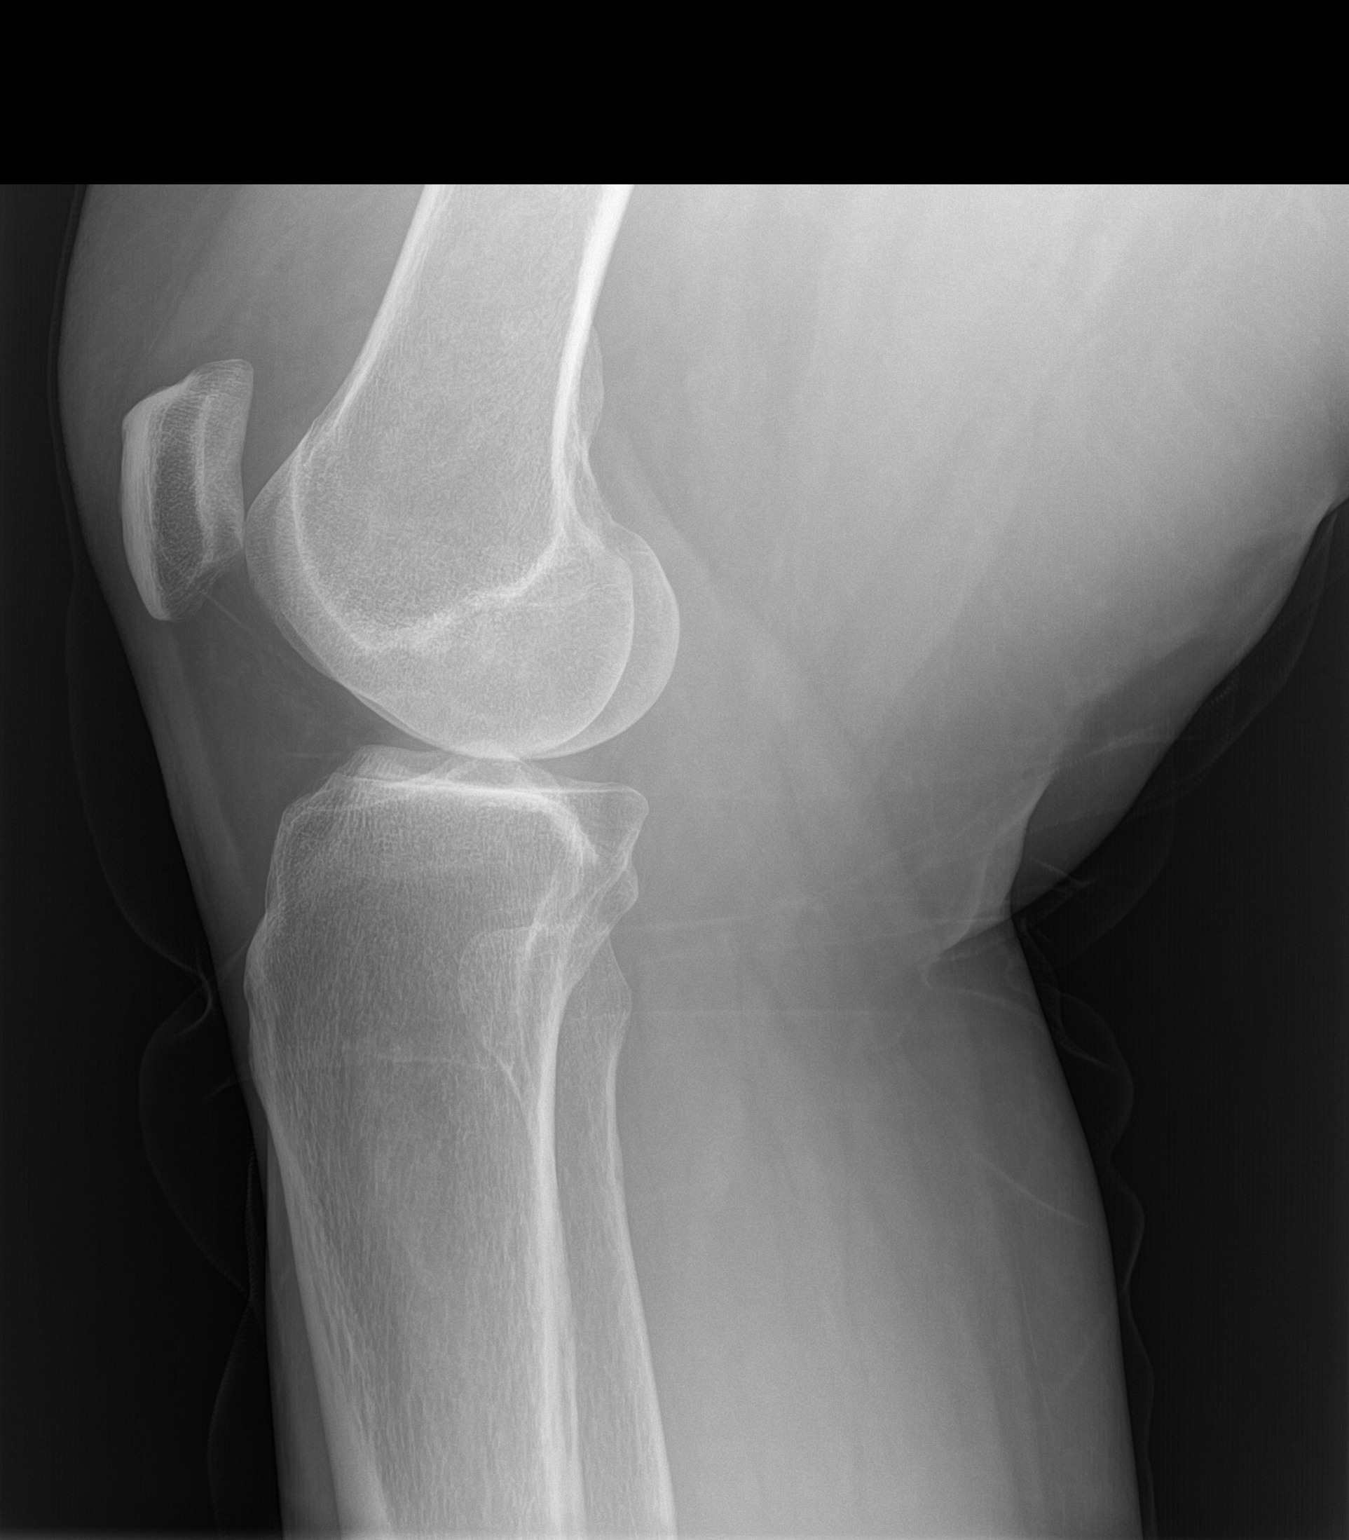

[patella]
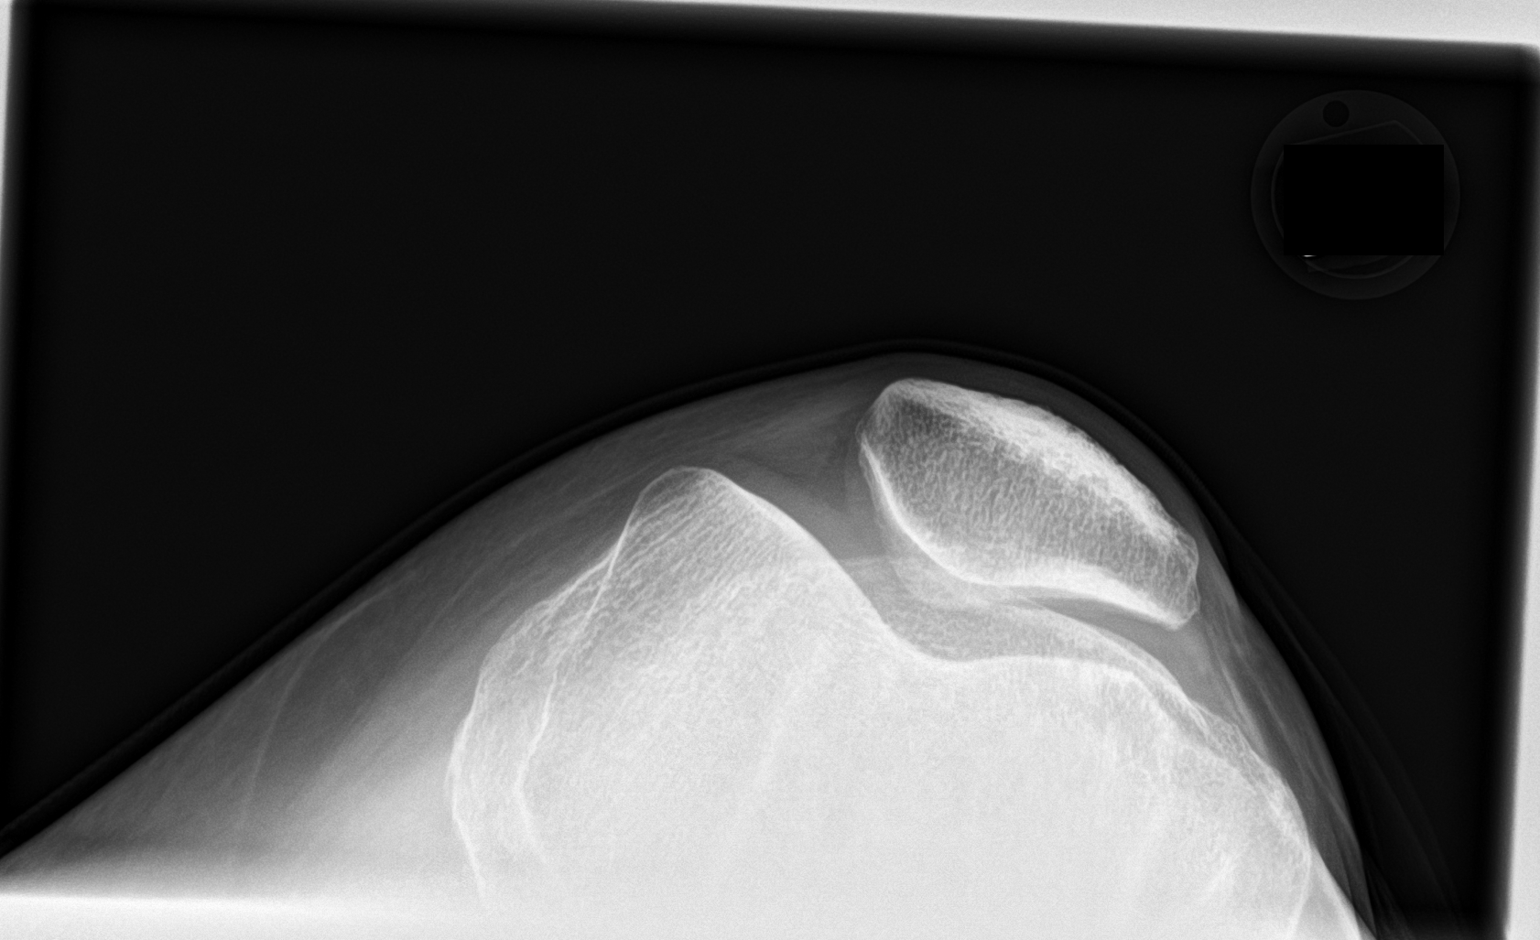

[3 of 3 positions shown; findings below may reference images not displayed]

FINDINGS: No acute bony or joint abnormality identified. No evidence of
fracture or dislocation.
IMPRESSION: No acute abnormality.

## 2022-11-07 ENCOUNTER — Other Ambulatory Visit: Payer: Self-pay | Admitting: Neurology

## 2022-11-10 ENCOUNTER — Other Ambulatory Visit: Payer: Self-pay | Admitting: Neurology

## 2022-11-10 ENCOUNTER — Other Ambulatory Visit: Payer: Self-pay

## 2022-11-10 NOTE — Telephone Encounter (Signed)
Requested Prescriptions   Pending Prescriptions Disp Refills   gabapentin (NEURONTIN) 300 MG capsule [Pharmacy Med Name: GABAPENTIN CAP 300MG  (NEUR)] 300 capsule 2    Sig: TAKE 1 CAPSULE BY MOUTH 3 TIMES  DAILY   Last seen 05/25/22 by Consiglio, next visit scheduled 12/01/22 routing to provider tofill Dispenses    Dispensed Days Supply Quantity Provider Pharmacy  GABAPENTIN  300 MG CAPS 09/03/2022 100 300 capsule Sater, Pearletha Furl, MD [facility]  gabapentin (NEURONTIN) capsule 06/25/2022 100 300 Sater, Pearletha Furl, MD

## 2022-11-12 ENCOUNTER — Other Ambulatory Visit: Payer: Self-pay | Admitting: Internal Medicine

## 2022-11-12 ENCOUNTER — Telehealth: Payer: Self-pay | Admitting: Internal Medicine

## 2022-11-12 DIAGNOSIS — M159 Polyosteoarthritis, unspecified: Secondary | ICD-10-CM

## 2022-11-12 MED ORDER — IBUPROFEN 600 MG PO TABS: 600.0000 mg | ORAL_TABLET | Freq: Three times a day (TID) | ORAL | 0 refills | Status: DC | PRN

## 2022-11-12 NOTE — Telephone Encounter (Signed)
Prescription Request  11/12/2022  LOV: 01/20/2022  What is the name of the medication or equipment? 600 mg. ibuprofen  Have you contacted your pharmacy to request a refill? No   Which pharmacy would you like this sent to?  [id number] - [place], [place] - [address] [place] [address] Phone: [phone] Fax: [phone]  O Patient notified that their request is being sent to the clinical staff for review and that they should receive a response within 2 business days.   Please advise at Mobile [phone] (mobile)

## 2022-11-17 DIAGNOSIS — H04123 Dry eye syndrome of bilateral lacrimal glands: Secondary | ICD-10-CM | POA: Diagnosis not present

## 2022-11-17 DIAGNOSIS — H401131 Primary open-angle glaucoma, bilateral, mild stage: Secondary | ICD-10-CM | POA: Diagnosis not present

## 2022-12-01 ENCOUNTER — Ambulatory Visit (INDEPENDENT_AMBULATORY_CARE_PROVIDER_SITE_OTHER): Payer: 59 | Admitting: Neurology

## 2022-12-01 ENCOUNTER — Other Ambulatory Visit: Payer: Self-pay | Admitting: Neurology

## 2022-12-01 ENCOUNTER — Telehealth: Payer: Self-pay | Admitting: Neurology

## 2022-12-01 ENCOUNTER — Encounter: Payer: Self-pay | Admitting: Neurology

## 2022-12-01 VITALS — BP 104/66 | HR 96 | Ht 67.0 in | Wt 177.0 lb

## 2022-12-01 DIAGNOSIS — G35 Multiple sclerosis: Secondary | ICD-10-CM | POA: Diagnosis not present

## 2022-12-01 DIAGNOSIS — M159 Polyosteoarthritis, unspecified: Secondary | ICD-10-CM | POA: Diagnosis not present

## 2022-12-01 DIAGNOSIS — R3915 Urgency of urination: Secondary | ICD-10-CM | POA: Diagnosis not present

## 2022-12-01 DIAGNOSIS — R269 Unspecified abnormalities of gait and mobility: Secondary | ICD-10-CM

## 2022-12-01 DIAGNOSIS — M21371 Foot drop, right foot: Secondary | ICD-10-CM

## 2022-12-01 DIAGNOSIS — R260 Ataxic gait: Secondary | ICD-10-CM | POA: Diagnosis not present

## 2022-12-01 MED ORDER — DALFAMPRIDINE ER 10 MG PO TB12: 10.0000 mg | ORAL_TABLET | Freq: Two times a day (BID) | ORAL | 0 refills | Status: DC

## 2022-12-01 MED ORDER — IBUPROFEN 600 MG PO TABS: 600.0000 mg | ORAL_TABLET | Freq: Three times a day (TID) | ORAL | 0 refills | Status: DC | PRN

## 2022-12-01 MED ORDER — GABAPENTIN 300 MG PO CAPS: ORAL_CAPSULE | ORAL | 2 refills | Status: DC

## 2022-12-01 MED ORDER — TOLTERODINE TARTRATE ER 4 MG PO CP24: 4.0000 mg | ORAL_CAPSULE | Freq: Every day | ORAL | 11 refills | Status: DC

## 2022-12-01 NOTE — Progress Notes (Signed)
[facility]  PATIENT: Mary Jackson DOB: 07/23/1980  REFERRING DOCTOR OR PCP:  Dr. Cardell Peach SOURCE: Patient, extensive notes from neurology, imaging and laboratory reports, MRI images personally reviewed.  _________________________________   HISTORICAL  CHIEF COMPLAINT:  Chief Complaint  Patient presents with   Follow-up    Pt in room 10. Pt sister in room. Her for MS follow up, uses walker. Pt said she has good and bad days. Pt said today is good day. Pt last fall was last month. Needs refill on ibuprofen.    HISTORY OF PRESENT ILLNESS:  Mary Jackson is a 42 y.o. woman with relapsing/active form of secondary progressive multiple sclerosis.  Update 12/01/2022: She is on Ocrevus (last infusion 09/2022 and next one is September 2024).  She has tolerated it well Due to converting to JCV Ab positive, she switched from Tysabri to Ocrevus.    JCV antibody 03/25/2020 was 1.43 and 1.37 a second tine 03/2021 (middle positive)      She uses a walker due to right > left  leg weakness.  She has a right foot drop.  She sometimes stumbles due to the foot drop.  She had a fall  in he bathroom and needed help to get back up.   Ampyra bid helps her some.   She has ataxia in her legs.   Baclofen has helped her leg spasms and gabapentin tid has helped her dysesthesias.     She has urinary urgency but no recent incontinence.  She has nocturia x 3 times most nights.Marland KitchenMarland KitchenShe has never been on a bladder medication.   She has constipation. She has reduced vision OD and colors are desaturated OD.    Sometimes she has diplopia.    She has a dysconjugate gaze  She is often fatigued.  She has nocturia x 3 and sometimes has trouble falling back asleep.  She also has trouble falling asleep and seldom goes to sleep before 1 am .  She does not snore.      She has had some depression, helped by sertraline.    She has seen psychology/counseling in the past for depression, stress management and other  issues.     MS History:   She was diagnosed with MS in 2008 after presenting with left leg weakness and clumsiness and a fall.  Imaging studies were performed and she was diagnosed.   She was initially placed on Copaxone but she had multiple relapses.   She was placed on Tysabri and is now on every 6 weeks.   Imaging:  MRI's of the brain dated 4/2 2019 and 04/09/2016.  The MRI showed multiple T2/flair hyperintense foci with single and confluent lesions in the periventricular, juxtacortical and deep white matter and also is a few foci in the infratentorial white matter and apparently at the cervical medullary junction.    We do not have an MRI of the cervical spine.  MRI report from 2015 did mention that there were new lesions compared to the previous MRI.  MRI of the brain 04/29/2020 showed Multiple single and confluent T2/FLAIR hyperintense foci in the hemispheres and a few foci in the cerebellum in a pattern configuration consistent with advanced chronic demyelinating plaque related to multiple sclerosis.  None of the foci appear to be acute and there were no changes compared to the 2019 MRI.     Generalized cortical and corpus callosum atrophy.  MRI of the cervical spine 04/29/2020 showed patchy T2 hyperintense signal throughout the cervical  spinal cord.  Mild multilevel degenerative changes but no spinal stenosis or nerve root compression  She has no FH of MS.   Parents have HTN.     REVIEW OF SYSTEMS: Constitutional: No fevers, chills, sweats, or change in appetite Eyes: No visual changes, double vision, eye pain Ear, nose and throat: No hearing loss, ear pain, nasal congestion, sore throat Cardiovascular: No chest pain, palpitations Respiratory:  No shortness of breath at rest or with exertion.   No wheezes GastrointestinaI: No nausea, vomiting, diarrhea, abdominal pain, fecal incontinence Genitourinary:  No dysuria, urinary retention or frequency.  No nocturia. Musculoskeletal:  No neck  pain, back pain Integumentary: No rash, pruritus, skin lesions Neurological: as above Psychiatric: No depression at this time.  No anxiety Endocrine: No palpitations, diaphoresis, change in appetite, change in weigh or increased thirst Hematologic/Lymphatic:  No anemia, purpura, petechiae. Allergic/Immunologic: No itchy/runny eyes, nasal congestion, recent allergic reactions, rashes  ALLERGIES: No Known Allergies  HOME MEDICATIONS:  Current Outpatient Medications:    amantadine (SYMMETREL) 100 MG capsule, TAKE 1 CAPSULE BY MOUTH TWICE  DAILY, Disp: 200 capsule, Rfl: 1   baclofen (LIORESAL) 10 MG tablet, TAKE 1 TABLET BY MOUTH 3 TIMES  DAILY, Disp: 270 tablet, Rfl: 3   Cholecalciferol (VITAMIN D3 PO), Take 5,000 Units by mouth daily., Disp: , Rfl:    clobetasol (OLUX) 0.05 % topical foam, Apply topically 2 (two) times daily., Disp: 100 g, Rfl: 3   ferrous sulfate 324 MG TBEC, Take 324 mg by mouth., Disp: , Rfl:    ketoconazole (NIZORAL) 2 % shampoo, Apply 1 Application topically 2 (two) times a week., Disp: 120 mL, Rfl: 2   medroxyPROGESTERone (DEPO-PROVERA) 150 MG/ML injection, Inject 1 mL (150 mg total) into the muscle every 3 (three) months., Disp: 1 mL, Rfl: 4   sertraline (ZOLOFT) 100 MG tablet, Take 100 mg by mouth daily., Disp: , Rfl:    tolterodine (DETROL LA) 4 MG 24 hr capsule, Take 1 capsule (4 mg total) by mouth daily., Disp: 30 capsule, Rfl: 11   dalfampridine 10 MG TB12, Take 1 tablet (10 mg total) by mouth 2 (two) times daily., Disp: 60 tablet, Rfl: 0   gabapentin (NEURONTIN) 300 MG capsule, Ne po qAM, one po qPM and two po qHS., Disp: 360 capsule, Rfl: 2   ibuprofen (ADVIL) 600 MG tablet, Take 1 tablet (600 mg total) by mouth every 8 (eight) hours as needed for moderate pain., Disp: 90 tablet, Rfl: 0  PAST MEDICAL HISTORY: Past Medical History:  Diagnosis Date   Depression with anxiety    Migraines    MS (multiple sclerosis) (HCC)     PAST SURGICAL  HISTORY:   FAMILY HISTORY: Family History  Problem Relation Age of Onset   Hypertension Sister    Diabetes Sister     SOCIAL HISTORY:  Social History   Socioeconomic History   Marital status: Single    Spouse name: Not on file   Number of children: Not on file   Years of education: Not on file   Highest education level: Not on file  Occupational History   Not on file  Tobacco Use   Smoking status: Every Day    Types: Cigarettes   Smokeless tobacco: Never   Tobacco comments:    3-4 cigs per day  Substance and Sexual Activity   Alcohol use: Yes   Drug use: Not Currently    Comment: occ   Sexual activity: Not Currently    Partners: Male  Birth control/protection: Abstinence  Other Topics Concern   Not on file  Social History Narrative   Right handed   Caffeine use: coffee, tea, soda   Lives with sister, Rene Kocher   Social Determinants of Health   Financial Resource Strain: Low Risk  (12/15/2021)   Overall Financial Resource Strain (CARDIA)    Difficulty of Paying Living Expenses: Not hard at all  Food Insecurity: No Food Insecurity (12/15/2021)   Hunger Vital Sign    Worried About Running Out of Food in the Last Year: Never true    Ran Out of Food in the Last Year: Never true  Transportation Needs: No Transportation Needs (12/15/2021)   PRAPARE - Administrator, Civil Service (Medical): No    Lack of Transportation (Non-Medical): No  Physical Activity: Inactive (12/15/2021)   Exercise Vital Sign    Days of Exercise per Week: 0 days    Minutes of Exercise per Session: 0 min  Stress: No Stress Concern Present (12/15/2021)   [employer] of Occupational Health - Occupational Stress Questionnaire    Feeling of Stress : Not at all  Social Connections: Moderately Integrated (12/15/2021)   Social Connection and Isolation Panel [NHANES]    Frequency of Communication with Friends and Family: Three times a week    Frequency of Social Gatherings with Friends and  Family: Once a week    Attends Religious Services: 1 to 4 times per year    Active Member of [employer] or Organizations: Yes    Attends Banker Meetings: 1 to 4 times per year    Marital Status: Never married  Intimate Partner Violence: Not At Risk (12/15/2021)   Humiliation, Afraid, Rape, and Kick questionnaire    Fear of Current or Ex-Partner: No    Emotionally Abused: No    Physically Abused: No    Sexually Abused: No     PHYSICAL EXAM  Vitals:   12/01/22 1131  BP: 104/66  Pulse: 96  Weight: 177 lb (80.3 kg)  Height: 5\' 7"  (1.702 m)    Body mass index is 27.72 kg/m.   General: The patient is well-developed and well-nourished and in no acute distress  HEENT:  Head is [place]/AT.  Sclera are anicteric.     Neck: Good range of motion.  Neurologic Exam  Mental status: The patient is alert and oriented x 3 at the time of the examination. The patient has apparent normal recent and remote memory, with an apparently normal attention span and concentration ability.   Speech is normal.  Cranial nerves: Extraocular movements show nystagmus on lateral gaze with slight disconjugate gaze looking right..   Facial strength is normal.  Trapezius and sternocleidomastoid strength is normal. No dysarthria is noted.    No obvious hearing deficits are noted.  Motor:  Muscle bulk is normal.   Tone is normal.  Strength is 5/5 in the arms, 4/5 proximally in the legs and in the feet and ankles 4+/5 the gastrocnemius muscles.     Sensory: Sensory testing is intact to vibration sensation in arms but mild reduced touch in left arm and leg.   Coordination: Cerebellar testing reveals mildly reduced left finger-nose-finger and moderately  reduced  heel-to-shin, worse on right .  Gait and station: She uses  hands to stand up from the chair.  She has an ataxic gait. And can take a few wide steps without support (but not across the room).  With a walker, she can go at least 100  feet.   She is unable  to tandem walk.  Romberg is positive.  Reflexes: Deep tendon reflexes are mildly increased in the arms  Reflexes are increased with crossed abductor responses at the knees, right > left  She has a couple beats of nonsustained ankle clonus bilaterally..       ASSESSMENT AND PLAN  Secondary progressive multiple sclerosis (HCC) - Plan: MR BRAIN W WO CONTRAST, MR CERVICAL SPINE W WO CONTRAST  Primary osteoarthritis involving multiple joints - Plan: ibuprofen (ADVIL) 600 MG tablet  Gait disturbance - Plan: MR BRAIN W WO CONTRAST, MR CERVICAL SPINE W WO CONTRAST  Ataxic gait  Right foot drop  Urinary urgency   1.    She will continue Ocrevus.  Check another MRI of the brain to determine if there is subclinical progression.  If this is occurring, consider a different disease modifying therapy.   At next visit check IgG/IgM and CBC/D 2.   Continue gabapentin, baclofen, sertraline.   Increase gabapentin to 300-300-600 mg.  Add tolterodine for bladder. 3.   Stay active and exercise as tolerated.   4.   She has a painful callous on left great toe, if worsens can see podiatry.  5.  RTC 6 months, sooner if new or worsening issues.    Milagros Middendorf A. Epimenio Foot, MD, [facility] 12/01/2022, 12:00 PM Certified in Neurology, Clinical Neurophysiology, Sleep Medicine and Neuroimaging  [facility] [address], [address] [place], [place] [address] [phone]

## 2022-12-01 NOTE — Telephone Encounter (Signed)
[employer] medicare NPR sent to GI [phone] 

## 2022-12-07 ENCOUNTER — Other Ambulatory Visit: Payer: Self-pay | Admitting: Internal Medicine

## 2022-12-07 DIAGNOSIS — L219 Seborrheic dermatitis, unspecified: Secondary | ICD-10-CM

## 2022-12-23 ENCOUNTER — Other Ambulatory Visit: Payer: Self-pay

## 2022-12-23 ENCOUNTER — Other Ambulatory Visit: Payer: Self-pay | Admitting: Neurology

## 2022-12-24 ENCOUNTER — Ambulatory Visit (INDEPENDENT_AMBULATORY_CARE_PROVIDER_SITE_OTHER): Payer: 59

## 2022-12-24 VITALS — Ht 67.0 in | Wt 177.0 lb

## 2022-12-24 DIAGNOSIS — Z0000 Encounter for general adult medical examination without abnormal findings: Secondary | ICD-10-CM

## 2022-12-24 NOTE — Progress Notes (Signed)
I connected with  Mary Jackson on 12/24/22 by a audio enabled telemedicine application and verified that I am speaking with the correct person using two identifiers.  Patient Location: Home  Provider Location: Office/Clinic  I discussed the limitations of evaluation and management by telemedicine. The patient expressed understanding and agreed to proceed.  Subjective:   Mary Jackson is a 42 y.o. female who presents for Medicare Annual (Subsequent) preventive examination.  Review of Systems     Cardiac Risk Factors include: dyslipidemia;sedentary lifestyle;smoking/ tobacco exposure     Objective:    Today's Vitals   12/24/22 1432  Weight: 177 lb (80.3 kg)  Height: 5\' 7"  (1.702 m)  PainSc: 0-No pain   Body mass index is 27.72 kg/m.     12/24/2022    2:33 PM 06/17/2021    7:03 AM 06/09/2021   12:04 PM 04/21/2019   11:10 AM  Advanced Directives  Does Patient Have a Medical Advance Directive? Yes Yes Yes No  Type of Advance Directive Healthcare Power of [employer] Power of [employer] Power of Attorney   Does patient want to make changes to medical advance directive?   No - Patient declined   Copy of Healthcare Power of Attorney in Chart? No - copy requested No - copy requested No - copy requested     Current Medications (verified) Outpatient Encounter Medications as of 12/24/2022  Medication Sig   amantadine (SYMMETREL) 100 MG capsule TAKE 1 CAPSULE BY MOUTH TWICE  DAILY   baclofen (LIORESAL) 10 MG tablet TAKE 1 TABLET BY MOUTH 3 TIMES  DAILY   Cholecalciferol (VITAMIN D3 PO) Take 5,000 Units by mouth daily.   clobetasol (OLUX) 0.05 % topical foam Apply topically 2 (two) times daily.   dalfampridine 10 MG TB12 TAKE 1 TABLET BY MOUTH TWICE  DAILY   ferrous sulfate 324 MG TBEC Take 324 mg by mouth.   gabapentin (NEURONTIN) 300 MG capsule Ne po qAM, one po qPM and two po qHS.   ibuprofen (ADVIL) 600 MG tablet Take 1 tablet (600 mg total) by mouth every  8 (eight) hours as needed for moderate pain.   ketoconazole (NIZORAL) 2 % shampoo APPLY 1 APPLICATION TOPICALLY 2 TIMES A WEEK   medroxyPROGESTERone (DEPO-PROVERA) 150 MG/ML injection Inject 1 mL (150 mg total) into the muscle every 3 (three) months.   sertraline (ZOLOFT) 100 MG tablet Take 100 mg by mouth daily.   tolterodine (DETROL LA) 4 MG 24 hr capsule Take 1 capsule (4 mg total) by mouth daily.   No facility-administered encounter medications on file as of 12/24/2022.    Allergies (verified) Patient has no known allergies.   History: Past Medical History:  Diagnosis Date   Depression with anxiety    Migraines    MS (multiple sclerosis) (HCC)    Past Surgical History:  Procedure Laterality Date   BREAST BIOPSY Right 03/2021   BREAST EXCISIONAL BIOPSY Right 06/17/2021   BREAST LUMPECTOMY WITH RADIOACTIVE SEED LOCALIZATION Right 06/17/2021   Procedure: RIGHT BREAST LUMPECTOMY WITH RADIOACTIVE SEED LOCALIZATION;  Surgeon: Griselda Miner, MD;  Location: [facility];  Service: General;  Laterality: Right;   Family History  Problem Relation Age of Onset   Hypertension Sister    Diabetes Sister    Social History   Socioeconomic History   Marital status: Single    Spouse name: Not on file   Number of children: Not on file   Years of education: Not on file  Highest education level: Not on file  Occupational History   Not on file  Tobacco Use   Smoking status: Every Day    Types: Cigarettes   Smokeless tobacco: Never   Tobacco comments:    3-4 cigs per day  Substance and Sexual Activity   Alcohol use: Yes   Drug use: Not Currently    Comment: occ   Sexual activity: Not Currently    Partners: Male    Birth control/protection: Abstinence  Other Topics Concern   Not on file  Social History Narrative   Right handed   Caffeine use: coffee, tea, soda   Lives with sister, Rene Kocher   Social Determinants of Health   Financial Resource Strain: Low Risk   (12/24/2022)   Overall Financial Resource Strain (CARDIA)    Difficulty of Paying Living Expenses: Not hard at all  Food Insecurity: No Food Insecurity (12/24/2022)   Hunger Vital Sign    Worried About Running Out of Food in the Last Year: Never true    Ran Out of Food in the Last Year: Never true  Transportation Needs: No Transportation Needs (12/24/2022)   PRAPARE - Administrator, Civil Service (Medical): No    Lack of Transportation (Non-Medical): No  Physical Activity: Inactive (12/24/2022)   Exercise Vital Sign    Days of Exercise per Week: 0 days    Minutes of Exercise per Session: 0 min  Stress: No Stress Concern Present (12/24/2022)   [employer] of Occupational Health - Occupational Stress Questionnaire    Feeling of Stress : Not at all  Social Connections: Moderately Integrated (12/24/2022)   Social Connection and Isolation Panel [NHANES]    Frequency of Communication with Friends and Family: Three times a week    Frequency of Social Gatherings with Friends and Family: Once a week    Attends Religious Services: 1 to 4 times per year    Active Member of [employer] or Organizations: Yes    Attends Banker Meetings: 1 to 4 times per year    Marital Status: Never married    Tobacco Counseling Ready to quit: Not Answered Counseling given: Not Answered Tobacco comments: 3-4 cigs per day   Clinical Intake:  Pre-visit preparation completed: Yes  Pain : No/denies pain Pain Score: 0-No pain     BMI - recorded: 27.72 Nutritional Status: BMI 25 -29 Overweight Nutritional Risks: None Diabetes: No  How often do you need to have someone help you when you read instructions, pamphlets, or other written materials from your doctor or pharmacy?: 1 - Never What is the last grade level you completed in school?: HSG  Diabetic? No  Interpreter Needed?: No  Information entered by :: Mell Guia N. Elleni Mozingo, LPN.   Activities of Daily Living    12/24/2022     2:35 PM  In your present state of health, do you have any difficulty performing the following activities:  Hearing? 0  Vision? 0  Difficulty concentrating or making decisions? 0  Walking or climbing stairs? 0  Dressing or bathing? 0  Doing errands, shopping? 0  Preparing Food and eating ? N  Using the Toilet? N  In the past six months, have you accidently leaked urine? Y  Do you have problems with loss of bowel control? N  Managing your Medications? N  Managing your Finances? N  Housekeeping or managing your Housekeeping? N    Patient Care Team: Etta Grandchild, MD as PCP - General (Internal Medicine) Epimenio Foot,  Pearletha Furl, MD (Neurology) Rodolph Bong, MD as Consulting Physician (Sports Medicine) Genia Del, MD as Consulting Physician (Obstetrics and Gynecology)  Indicate any recent Medical Services you may have received from other than [facility] providers in the past year (date may be approximate).     Assessment:   This is a routine wellness examination for [place].  Hearing/Vision screen Hearing Screening - Comments:: Denies hearing difficulties   Vision Screening - Comments:: Wears rx glasses - up to date with routine eye exams with Sharyn Dross, MD.   Dietary issues and exercise activities discussed: Current Exercise Habits: The patient does not participate in regular exercise at present, Exercise limited by: psychological condition(s);neurologic condition(s);orthopedic condition(s)   Goals Addressed             This Visit's Progress    Patient declined any healthcare goals for this year.        Depression Screen    12/24/2022    2:35 PM 12/15/2021   10:54 AM 07/21/2021   11:03 AM 12/03/2020   10:45 AM  PHQ 2/9 Scores  PHQ - 2 Score 0 0 0 0  PHQ- 9 Score 0 0 0     Fall Risk    12/24/2022    2:34 PM 12/15/2021   10:56 AM  Fall Risk   Falls in the past year? 1 0  Number falls in past yr: 1 0  Injury with Fall? 0 0  Risk for fall due to : History of fall(s)      FALL RISK PREVENTION PERTAINING TO THE HOME:  Any stairs in or around the home? No  If so, are there any without handrails? No  Home free of loose throw rugs in walkways, pet beds, electrical cords, etc? Yes  Adequate lighting in your home to reduce risk of falls? Yes   ASSISTIVE DEVICES UTILIZED TO PREVENT FALLS:  Life alert? No  Use of a cane, walker or w/c? No  Grab bars in the bathroom? No  Shower chair or bench in shower? No  Elevated toilet seat or a handicapped toilet? No   TIMED UP AND GO:  Was the test performed? No . Telephonic Visit  Cognitive Function:        12/24/2022    2:36 PM 12/15/2021   10:56 AM  6CIT Screen  What Year? 0 points 0 points  What month? 0 points 0 points  What time? 0 points 0 points  Count back from 20 0 points 0 points  Months in reverse 0 points 0 points  Repeat phrase 0 points 10 points  Total Score 0 points 10 points    Immunizations Immunization History  Administered Date(s) Administered   Influenza,inj,Quad PF,6+ Mos 07/21/2021   PFIZER(Purple Top)SARS-COV-2 Vaccination 10/01/2019, 10/22/2019, 06/01/2020   PNEUMOCOCCAL CONJUGATE-20 12/03/2020   Tdap 12/03/2020    TDAP status: Up to date  Flu Vaccine status: Up to date  Pneumococcal vaccine status: Up to date  Covid-19 vaccine status: Completed vaccines  Qualifies for Shingles Vaccine? No   Zostavax completed No   Shingrix Completed?: No.    Education has been provided regarding the importance of this vaccine. Patient has been advised to call insurance company to determine out of pocket expense if they have not yet received this vaccine. Advised may also receive vaccine at local pharmacy or Health Dept. Verbalized acceptance and understanding.  Screening Tests Health Maintenance  Topic Date Due   COVID-19 Vaccine (4 - 2023-24 season) 03/13/2022   INFLUENZA VACCINE  02/11/2023   Medicare Annual Wellness (AWV)  12/24/2023   PAP SMEAR-Modifier  03/25/2024    DTaP/Tdap/Td (2 - Td or Tdap) 12/04/2030   Hepatitis C Screening  Completed   HIV Screening  Completed   HPV VACCINES  Aged Out    Health Maintenance  Health Maintenance Due  Topic Date Due   COVID-19 Vaccine (4 - 2023-24 season) 03/13/2022    Colorectal cancer screening: Never done; due at age 59.  Mammogram status: Completed 04/13/2022. Repeat every year  Bone Density scan: Never done; not due until age 11 or advised by provider at an earlier age.  Lung Cancer Screening: (Low Dose CT Chest recommended if Age 46-80 years, 30 pack-year currently smoking OR have quit w/in 15years.) does not qualify.   Lung Cancer Screening Referral: no  Additional Screening:  Hepatitis C Screening: does qualify; Completed 04/21/2021  Vision Screening: Recommended annual ophthalmology exams for early detection of glaucoma and other disorders of the eye. Is the patient up to date with their annual eye exam?  Yes  Who is the provider or what is the name of the office in which the patient attends annual eye exams? Oliver Pila, MD. If pt is not established with a provider, would they like to be referred to a provider to establish care? No .   Dental Screening: Recommended annual dental exams for proper oral hygiene  Community Resource Referral / Chronic Care Management: CRR required this visit?  No   CCM required this visit?  No      Plan:     I have personally reviewed and noted the following in the patient's chart:   Medical and social history Use of alcohol, tobacco or illicit drugs  Current medications and supplements including opioid prescriptions. Patient is not currently taking opioid prescriptions. Functional ability and status Nutritional status Physical activity Advanced directives List of other physicians Hospitalizations, surgeries, and ER visits in previous 12 months Vitals Screenings to include cognitive, depression, and falls Referrals and appointments  In addition,  I have reviewed and discussed with patient certain preventive protocols, quality metrics, and best practice recommendations. A written personalized care plan for preventive services as well as general preventive health recommendations were provided to patient.     Mickeal Needy, LPN   03/10/5620   Nurse Notes: Normal cognitive status assessed by direct observation via telephone conversation by this Nurse Health Advisor. No abnormalities found.

## 2022-12-24 NOTE — Patient Instructions (Addendum)
Mary Jackson , Thank you for taking time to come for your Medicare Wellness Visit. I appreciate your ongoing commitment to your health goals. Please review the following plan we discussed and let me know if I can assist you in the future.   These are the goals we discussed:  Goals      Patient declined any healthcare goals for this year.        This is a list of the screening recommended for you and due dates:  Health Maintenance  Topic Date Due   COVID-19 Vaccine (4 - 2023-24 season) 03/13/2022   Flu Shot  02/11/2023   Medicare Annual Wellness Visit  12/24/2023   Pap Smear  03/25/2024   DTaP/Tdap/Td vaccine (2 - Td or Tdap) 12/04/2030   Hepatitis C Screening  Completed   HIV Screening  Completed   HPV Vaccine  Aged Out    Advanced directives: Patient has a healthcare power of attorney.  Conditions/risks identified: Yes  Next appointment: Follow up in one year for your annual wellness visit via telephone call with Nurse Henley Blyth on 12/30/2023 at 3:00 p.m.  If you need to cancel or reschedule please call [phone].  Preventive Care 40-64 Years, Female Preventive care refers to lifestyle choices and visits with your health care provider that can promote health and wellness. What does preventive care include? A yearly physical exam. This is also called an annual well check. Dental exams once or twice a year. Routine eye exams. Ask your health care provider how often you should have your eyes checked. Personal lifestyle choices, including: Daily care of your teeth and gums. Regular physical activity. Eating a healthy diet. Avoiding tobacco and drug use. Limiting alcohol use. Practicing safe sex. Taking low-dose aspirin daily starting at age 72. Taking vitamin and mineral supplements as recommended by your health care provider. What happens during an annual well check? The services and screenings done by your health care provider during your annual well check will depend on  your age, overall health, lifestyle risk factors, and family history of disease. Counseling  Your health care provider may ask you questions about your: Alcohol use. Tobacco use. Drug use. Emotional well-being. Home and relationship well-being. Sexual activity. Eating habits. Work and work Astronomer. Method of birth control. Menstrual cycle. Pregnancy history. Screening  You may have the following tests or measurements: Height, weight, and BMI. Blood pressure. Lipid and cholesterol levels. These may be checked every 5 years, or more frequently if you are over 21 years old. Skin check. Lung cancer screening. You may have this screening every year starting at age 46 if you have a 30-pack-year history of smoking and currently smoke or have quit within the past 15 years. Fecal occult blood test (FOBT) of the stool. You may have this test every year starting at age 45. Flexible sigmoidoscopy or colonoscopy. You may have a sigmoidoscopy every 5 years or a colonoscopy every 10 years starting at age 61. Hepatitis C blood test. Hepatitis B blood test. Sexually transmitted disease (STD) testing. Diabetes screening. This is done by checking your blood sugar (glucose) after you have not eaten for a while (fasting). You may have this done every 1-3 years. Mammogram. This may be done every 1-2 years. Talk to your health care provider about when you should start having regular mammograms. This may depend on whether you have a family history of breast cancer. BRCA-related cancer screening. This may be done if you have a family history of breast,  ovarian, tubal, or peritoneal cancers. Pelvic exam and Pap test. This may be done every 3 years starting at age 37. Starting at age 73, this may be done every 5 years if you have a Pap test in combination with an HPV test. Bone density scan. This is done to screen for osteoporosis. You may have this scan if you are at high risk for osteoporosis. Discuss your  test results, treatment options, and if necessary, the need for more tests with your health care provider. Vaccines  Your health care provider may recommend certain vaccines, such as: Influenza vaccine. This is recommended every year. Tetanus, diphtheria, and acellular pertussis (Tdap, Td) vaccine. You may need a Td booster every 10 years. Zoster vaccine. You may need this after age 24. Pneumococcal 13-valent conjugate (PCV13) vaccine. You may need this if you have certain conditions and were not previously vaccinated. Pneumococcal polysaccharide (PPSV23) vaccine. You may need one or two doses if you smoke cigarettes or if you have certain conditions. Talk to your health care provider about which screenings and vaccines you need and how often you need them. This information is not intended to replace advice given to you by your health care provider. Make sure you discuss any questions you have with your health care provider. Document Released: 07/26/2015 Document Revised: 03/18/2016 Document Reviewed: 04/30/2015 Elsevier Interactive Patient Education  2017 [employer].    Fall Prevention in the Home Falls can cause injuries. They can happen to people of all ages. There are many things you can do to make your home safe and to help prevent falls. What can I do on the outside of my home? Regularly fix the edges of walkways and driveways and fix any cracks. Remove anything that might make you trip as you walk through a door, such as a raised step or threshold. Trim any bushes or trees on the path to your home. Use bright outdoor lighting. Clear any walking paths of anything that might make someone trip, such as rocks or tools. Regularly check to see if handrails are loose or broken. Make sure that both sides of any steps have handrails. Any raised decks and porches should have guardrails on the edges. Have any leaves, snow, or ice cleared regularly. Use sand or salt on walking paths during  winter. Clean up any spills in your garage right away. This includes oil or grease spills. What can I do in the bathroom? Use night lights. Install grab bars by the toilet and in the tub and shower. Do not use towel bars as grab bars. Use non-skid mats or decals in the tub or shower. If you need to sit down in the shower, use a plastic, non-slip stool. Keep the floor dry. Clean up any water that spills on the floor as soon as it happens. Remove soap buildup in the tub or shower regularly. Attach bath mats securely with double-sided non-slip rug tape. Do not have throw rugs and other things on the floor that can make you trip. What can I do in the bedroom? Use night lights. Make sure that you have a light by your bed that is easy to reach. Do not use any sheets or blankets that are too big for your bed. They should not hang down onto the floor. Have a firm chair that has side arms. You can use this for support while you get dressed. Do not have throw rugs and other things on the floor that can make you trip. What can  I do in the kitchen? Clean up any spills right away. Avoid walking on wet floors. Keep items that you use a lot in easy-to-reach places. If you need to reach something above you, use a strong step stool that has a grab bar. Keep electrical cords out of the way. Do not use floor polish or wax that makes floors slippery. If you must use wax, use non-skid floor wax. Do not have throw rugs and other things on the floor that can make you trip. What can I do with my stairs? Do not leave any items on the stairs. Make sure that there are handrails on both sides of the stairs and use them. Fix handrails that are broken or loose. Make sure that handrails are as long as the stairways. Check any carpeting to make sure that it is firmly attached to the stairs. Fix any carpet that is loose or worn. Avoid having throw rugs at the top or bottom of the stairs. If you do have throw rugs,  attach them to the floor with carpet tape. Make sure that you have a light switch at the top of the stairs and the bottom of the stairs. If you do not have them, ask someone to add them for you. What else can I do to help prevent falls? Wear shoes that: Do not have high heels. Have rubber bottoms. Are comfortable and fit you well. Are closed at the toe. Do not wear sandals. If you use a stepladder: Make sure that it is fully opened. Do not climb a closed stepladder. Make sure that both sides of the stepladder are locked into place. Ask someone to hold it for you, if possible. Clearly mark and make sure that you can see: Any grab bars or handrails. First and last steps. Where the edge of each step is. Use tools that help you move around (mobility aids) if they are needed. These include: Canes. Walkers. Scooters. Crutches. Turn on the lights when you go into a dark area. Replace any light bulbs as soon as they burn out. Set up your furniture so you have a clear path. Avoid moving your furniture around. If any of your floors are uneven, fix them. If there are any pets around you, be aware of where they are. Review your medicines with your doctor. Some medicines can make you feel dizzy. This can increase your chance of falling. Ask your doctor what other things that you can do to help prevent falls. This information is not intended to replace advice given to you by your health care provider. Make sure you discuss any questions you have with your health care provider. Document Released: 04/25/2009 Document Revised: 12/05/2015 Document Reviewed: 08/03/2014 Elsevier Interactive Patient Education  2017 [employer].

## 2023-01-13 ENCOUNTER — Other Ambulatory Visit: Payer: Self-pay | Admitting: Neurology

## 2023-02-01 ENCOUNTER — Ambulatory Visit
Admission: RE | Admit: 2023-02-01 | Discharge: 2023-02-01 | Disposition: A | Discharge status: Home / Self Care | Payer: 59 | Source: Ambulatory Visit | Attending: Neurology | Admitting: Neurology

## 2023-02-01 DIAGNOSIS — G35C0 Secondary progressive multiple sclerosis, unspecified: Secondary | ICD-10-CM

## 2023-02-01 DIAGNOSIS — R269 Unspecified abnormalities of gait and mobility: Secondary | ICD-10-CM

## 2023-02-01 DIAGNOSIS — G35 Multiple sclerosis: Secondary | ICD-10-CM

## 2023-02-01 MED ORDER — GADOPICLENOL 0.5 MMOL/ML IV SOLN
7.5000 mL | Freq: Once | INTRAVENOUS | Status: AC | PRN
  Administered 2023-02-01: 7.5 mL via INTRAVENOUS

## 2023-02-06 ENCOUNTER — Other Ambulatory Visit: Payer: Self-pay | Admitting: Internal Medicine

## 2023-02-06 DIAGNOSIS — L219 Seborrheic dermatitis, unspecified: Secondary | ICD-10-CM

## 2023-02-07 ENCOUNTER — Other Ambulatory Visit: Payer: Self-pay | Admitting: Internal Medicine

## 2023-02-07 DIAGNOSIS — L219 Seborrheic dermatitis, unspecified: Secondary | ICD-10-CM

## 2023-02-22 DIAGNOSIS — H401131 Primary open-angle glaucoma, bilateral, mild stage: Secondary | ICD-10-CM | POA: Diagnosis not present

## 2023-03-16 ENCOUNTER — Other Ambulatory Visit: Payer: Self-pay | Admitting: *Deleted

## 2023-03-16 DIAGNOSIS — G35 Multiple sclerosis: Secondary | ICD-10-CM | POA: Diagnosis not present

## 2023-03-16 DIAGNOSIS — M159 Polyosteoarthritis, unspecified: Secondary | ICD-10-CM

## 2023-03-16 MED ORDER — IBUPROFEN 600 MG PO TABS
600.0000 mg | ORAL_TABLET | Freq: Three times a day (TID) | ORAL | 0 refills | Status: DC | PRN
Start: 2023-03-16 — End: 2023-04-07

## 2023-03-16 NOTE — Telephone Encounter (Signed)
Last seen on 12/01/22 Follow up scheduled on 06/21/23

## 2023-03-18 ENCOUNTER — Other Ambulatory Visit: Payer: Self-pay | Admitting: General Surgery

## 2023-03-18 DIAGNOSIS — Z1231 Encounter for screening mammogram for malignant neoplasm of breast: Secondary | ICD-10-CM

## 2023-03-20 ENCOUNTER — Other Ambulatory Visit: Payer: Self-pay | Admitting: Neurology

## 2023-03-24 NOTE — Telephone Encounter (Signed)
Last seen on 12/01/22 Follow up scheduled on 06/21/23

## 2023-03-30 ENCOUNTER — Other Ambulatory Visit: Payer: Self-pay | Admitting: *Deleted

## 2023-03-30 MED ORDER — TOLTERODINE TARTRATE ER 4 MG PO CP24: 4.0000 mg | ORAL_CAPSULE | Freq: Every day | ORAL | 0 refills | Status: DC

## 2023-04-05 ENCOUNTER — Other Ambulatory Visit: Payer: Self-pay | Admitting: Neurology

## 2023-04-05 DIAGNOSIS — M159 Polyosteoarthritis, unspecified: Secondary | ICD-10-CM

## 2023-04-07 NOTE — Telephone Encounter (Signed)
Last seen on 12/01/22 Follow up scheduled on 06/21/23

## 2023-04-26 ENCOUNTER — Ambulatory Visit
Admission: RE | Admit: 2023-04-26 | Discharge: 2023-04-26 | Disposition: A | Discharge status: Home / Self Care | Payer: 59 | Source: Ambulatory Visit | Attending: General Surgery | Admitting: General Surgery

## 2023-04-26 DIAGNOSIS — Z1231 Encounter for screening mammogram for malignant neoplasm of breast: Secondary | ICD-10-CM

## 2023-05-29 ENCOUNTER — Other Ambulatory Visit: Payer: Self-pay | Admitting: Neurology

## 2023-05-31 NOTE — Telephone Encounter (Signed)
Last seen on 12/01/22 Follow up scheduled on 06/21/23

## 2023-06-08 ENCOUNTER — Other Ambulatory Visit: Payer: Self-pay | Admitting: Neurology

## 2023-06-15 ENCOUNTER — Other Ambulatory Visit: Payer: Self-pay | Admitting: Neurology

## 2023-06-15 DIAGNOSIS — M150 Primary generalized (osteo)arthritis: Secondary | ICD-10-CM

## 2023-06-15 NOTE — Telephone Encounter (Signed)
Last seen on 12/01/22 Follow up scheduled on 06/21/23

## 2023-06-17 NOTE — Progress Notes (Signed)
PATIENT: Mary Jackson DOB: 1981-04-10  REASON FOR VISIT: follow up HISTORY FROM: patient  Chief Complaint  Patient presents with   Follow-up    Pt in room 1 sister in room. Here for MS follow up on Ocrevus. Pt reports doing okay. 1 fall since last visit. Needs refill on Detrol     HISTORY OF PRESENT ILLNESS:  06/21/23 ALL:  She returns for follow up for relapsing/active secondary progressive MS. She continues Ocrevus infusions q6M. MRI brain and cervical spine were stable 01/2023. One new lesion in left frontal lobe from 2021.   She feels that MS is stable. No changes since last being seen. Gait is stable with Rolator. She reports once fall since last visit. No injuries. She has bilateral lower weakness. Left foot drop, better with brace. No worsening pain. Dysesthesias was and wane. She continues Ampyra BID, amantadine 100mg  BID, gabapentin 300mg /300/600 (increased at last visit 11/2022) and baclofen TID.   She is sleeping well. Trazodone made her too sleepy. She has not taken this medication recently. Mood is good. She continues sertraline per PCP and tolerating well. Sister reports more delusional thoughts at times. No significant behavioral issues.   She is taking vitamin D daily. No changes in urinary or bowel functioning. Detrol was added for urinary frequency at last visit. She feels it does help some. She does tend to stay constipated. She does not drink much water. She is taking stool softeners.   No double vision. She is planning cataract surgery at the end of the month.   History (copied from Dr Bonnita Hollow previous note):  Mary Jackson is a 42 y.o. woman with relapsing/active form of secondary progressive multiple sclerosis.   Update 12/01/2022: She is on Ocrevus (last infusion 09/2022 and next one is September 2024).  She has tolerated it well Due to converting to JCV Ab positive, she switched from Tysabri to Ocrevus.    JCV antibody 03/25/2020 was 1.43 and 1.37 a second  tine 03/2021 (middle positive)       She uses a walker due to right > left  leg weakness.  She has a right foot drop.  She sometimes stumbles due to the foot drop.  She had a fall  in he bathroom and needed help to get back up.   Ampyra bid helps her some.   She has ataxia in her legs.   Baclofen has helped her leg spasms and gabapentin tid has helped her dysesthesias.      She has urinary urgency but no recent incontinence.  She has nocturia x 3 times most nights.Marland KitchenMarland KitchenShe has never been on a bladder medication.   She has constipation. She has reduced vision OD and colors are desaturated OD.    Sometimes she has diplopia.    She has a dysconjugate gaze   She is often fatigued.  She has nocturia x 3 and sometimes has trouble falling back asleep.  She also has trouble falling asleep and seldom goes to sleep before 1 am .  She does not snore.       She has had some depression, helped by sertraline.    She has seen psychology/counseling in the past for depression, stress management and other issues.      MS History:   She was diagnosed with MS in 2008 after presenting with left leg weakness and clumsiness and a fall.  Imaging studies were performed and she was diagnosed.   She was initially placed on Copaxone but  she had multiple relapses.   She was placed on Tysabri and is now on every 6 weeks.    Imaging:  MRI's of the brain dated 4/2 2019 and 04/09/2016.  The MRI showed multiple T2/flair hyperintense foci with single and confluent lesions in the periventricular, juxtacortical and deep white matter and also is a few foci in the infratentorial white matter and apparently at the cervical medullary junction.     We do not have an MRI of the cervical spine.  MRI report from 2015 did mention that there were new lesions compared to the previous MRI.   MRI of the brain 04/29/2020 showed Multiple single and confluent T2/FLAIR hyperintense foci in the hemispheres and a few foci in the cerebellum in a pattern  configuration consistent with advanced chronic demyelinating plaque related to multiple sclerosis.  None of the foci appear to be acute and there were no changes compared to the 2019 MRI.     Generalized cortical and corpus callosum atrophy.   MRI of the cervical spine 04/29/2020 showed patchy T2 hyperintense signal throughout the cervical spinal cord.  Mild multilevel degenerative changes but no spinal stenosis or nerve root compression   She has no FH of MS.   Parents have HTN.     REVIEW OF SYSTEMS: Out of a complete 14 system review of symptoms, the patient complains only of the following symptoms, chronic pain, numbness and tingling, imbalance and all other reviewed systems are negative.   ALLERGIES: No Known Allergies  HOME MEDICATIONS: Outpatient Medications Prior to Visit  Medication Sig Dispense Refill   baclofen (LIORESAL) 10 MG tablet TAKE 1 TABLET BY MOUTH 3 TIMES  DAILY 270 tablet 3   Cholecalciferol (VITAMIN D3 PO) Take 5,000 Units by mouth daily.     clobetasol (OLUX) 0.05 % topical foam Apply topically 2 (two) times daily. 100 g 3   dalfampridine 10 MG TB12 TAKE 1 TABLET BY MOUTH TWICE  DAILY 180 tablet 2   ferrous sulfate 324 MG TBEC Take 324 mg by mouth.     ibuprofen (ADVIL) 600 MG tablet TAKE 1 TABLET BY MOUTH EVERY 8  HOURS AS NEEDED FOR MODERATE  PAIN 90 tablet 2   ketoconazole (NIZORAL) 2 % shampoo APPLY 1 APPLICATION TOPICALLY 2 TIMES A WEEK 120 mL 0   sertraline (ZOLOFT) 100 MG tablet Take 100 mg by mouth daily.     amantadine (SYMMETREL) 100 MG capsule TAKE 1 CAPSULE BY MOUTH TWICE  DAILY 200 capsule 0   gabapentin (NEURONTIN) 300 MG capsule Ne po qAM, one po qPM and two po qHS. 360 capsule 2   tolterodine (DETROL LA) 4 MG 24 hr capsule TAKE 1 CAPSULE BY MOUTH DAILY 90 capsule 0   medroxyPROGESTERone (DEPO-PROVERA) 150 MG/ML injection Inject 1 mL (150 mg total) into the muscle every 3 (three) months. (Patient not taking: Reported on 06/21/2023) 1 mL 4   No  facility-administered medications prior to visit.    PAST MEDICAL HISTORY: Past Medical History:  Diagnosis Date   Depression with anxiety    Migraines    MS (multiple sclerosis) (HCC)     PAST SURGICAL HISTORY: Past Surgical History:  Procedure Laterality Date   BREAST BIOPSY Right 03/2021   BREAST EXCISIONAL BIOPSY Right 06/17/2021   BREAST LUMPECTOMY WITH RADIOACTIVE SEED LOCALIZATION Right 06/17/2021   Procedure: RIGHT BREAST LUMPECTOMY WITH RADIOACTIVE SEED LOCALIZATION;  Surgeon: Griselda Miner, MD;  Location: [facility];  Service: General;  Laterality: Right;  FAMILY HISTORY: Family History  Problem Relation Age of Onset   Hypertension Sister    Diabetes Sister     SOCIAL HISTORY: Social History   Socioeconomic History   Marital status: Single    Spouse name: Not on file   Number of children: Not on file   Years of education: Not on file   Highest education level: Not on file  Occupational History   Not on file  Tobacco Use   Smoking status: Every Day    Types: Cigarettes   Smokeless tobacco: Never   Tobacco comments:    3-4 cigs per day  Substance and Sexual Activity   Alcohol use: Yes   Drug use: Not Currently    Comment: occ   Sexual activity: Not Currently    Partners: Male    Birth control/protection: Abstinence  Other Topics Concern   Not on file  Social History Narrative   Right handed   Caffeine use: coffee, tea, soda   Lives with sister, Rene Kocher   Social Determinants of Health   Financial Resource Strain: Low Risk  (12/24/2022)   Overall Financial Resource Strain (CARDIA)    Difficulty of Paying Living Expenses: Not hard at all  Food Insecurity: No Food Insecurity (12/24/2022)   Hunger Vital Sign    Worried About Running Out of Food in the Last Year: Never true    Ran Out of Food in the Last Year: Never true  Transportation Needs: No Transportation Needs (12/24/2022)   PRAPARE - Administrator, Civil Service  (Medical): No    Lack of Transportation (Non-Medical): No  Physical Activity: Inactive (12/24/2022)   Exercise Vital Sign    Days of Exercise per Week: 0 days    Minutes of Exercise per Session: 0 min  Stress: No Stress Concern Present (12/24/2022)   [employer] of Occupational Health - Occupational Stress Questionnaire    Feeling of Stress : Not at all  Social Connections: Moderately Integrated (12/24/2022)   Social Connection and Isolation Panel [NHANES]    Frequency of Communication with Friends and Family: Three times a week    Frequency of Social Gatherings with Friends and Family: Once a week    Attends Religious Services: 1 to 4 times per year    Active Member of [employer] or Organizations: Yes    Attends Banker Meetings: 1 to 4 times per year    Marital Status: Never married  Intimate Partner Violence: Not At Risk (12/24/2022)   Humiliation, Afraid, Rape, and Kick questionnaire    Fear of Current or Ex-Partner: No    Emotionally Abused: No    Physically Abused: No    Sexually Abused: No      PHYSICAL EXAM  Vitals:   06/21/23 1328  BP: 102/70  Pulse: 78  Weight: 183 lb 8 oz (83.2 kg)  Height: 5\' 7"  (1.702 m)     Body mass index is 28.74 kg/m.  Generalized: Well developed, in no acute distress  Cardiology: normal rate and rhythm, no murmur noted Neurological examination  Mentation: Alert oriented to time, place, history taking. Follows all commands speech and language fluent Cranial nerve II-XII: Pupils were equal round reactive to light. Extraocular movements were full, visual field were full on confrontational test. Facial sensation and strength were normal. Head turning and shoulder shrug  were normal and symmetric. Motor: The motor testing reveals 5 over 5 strength of upper extremities, 4-/5 bilateral lowers. Good symmetric motor tone is noted throughout.  Sensory: Sensory testing is intact to soft touch on all 4 extremities. No evidence of  extinction is noted.  Coordination: Cerebellar testing reveals mildly ataxic finger-nose-finger (L>R) and reduced heel-to-shin bilaterally.  Gait and station: Gait is ataxic, stable with Rolator, left leg drags, Unable to tandem Reflexes: Deep tendon reflexes are symmetric, brisk    DIAGNOSTIC DATA (LABS, IMAGING, TESTING) - I reviewed patient records, labs, notes, testing and imaging myself where available.      No data to display           Lab Results  Component Value Date   WBC 6.7 05/25/2022   HGB 12.7 05/25/2022   HCT 39.3 05/25/2022   MCV 84 05/25/2022   PLT 261 05/25/2022      Component Value Date/Time   NA 143 01/20/2022 1100   NA 145 (H) 11/10/2021 1402   K 3.6 01/20/2022 1100   CL 107 01/20/2022 1100   CO2 29 01/20/2022 1100   GLUCOSE 77 01/20/2022 1100   BUN 25 (H) 01/20/2022 1100   BUN 16 11/10/2021 1402   CREATININE 0.93 01/20/2022 1100   CALCIUM 10.4 01/20/2022 1100   PROT 7.8 11/10/2021 1402   ALBUMIN 4.3 11/10/2021 1402   AST 28 11/10/2021 1402   ALT 37 (H) 11/10/2021 1402   ALKPHOS 73 11/10/2021 1402   BILITOT 0.3 11/10/2021 1402   GFRNONAA 103 10/02/2019 1008   GFRAA 119 10/02/2019 1008   Lab Results  Component Value Date   CHOL 168 12/03/2020   HDL 69.90 12/03/2020   LDLCALC 88 12/03/2020   TRIG 51.0 12/03/2020   CHOLHDL 2 12/03/2020   No results found for: "HGBA1C" Lab Results  Component Value Date   VITAMINB12 589 12/03/2020   Lab Results  Component Value Date   TSH 0.73 12/03/2020       ASSESSMENT AND PLAN 42 y.o. year old female  has a past medical history of Depression with anxiety, Migraines, and MS (multiple sclerosis) (HCC). here with     ICD-10-CM   1. Secondary progressive multiple sclerosis (HCC)  G35 IgG, IgA, IgM    CBC with Differential/Platelets      She is doing well today. MS is stable. She will continue Ocrevus infusions every 6 months. Continue Ampyra and amantadine for gait, gabapentin for dysesthesias  and baclofen for muscle spasms. PCP now filling sertraline. We will update labs today. Safety and fall precautions reviewed. PT offered but declined at this time. She was encouraged to stay physically active. She will follow up in 6 months, sooner if needed.    Orders Placed This Encounter  Procedures   IgG, IgA, IgM   CBC with Differential/Platelets     Meds ordered this encounter  Medications   amantadine (SYMMETREL) 100 MG capsule    Sig: Take 1 capsule (100 mg total) by mouth 2 (two) times daily.    Dispense:  180 capsule    Refill:  3    Order Specific Question:   Supervising Provider    Answer:   Anson Fret [[id number]]   gabapentin (NEURONTIN) 300 MG capsule    Sig: Take 1 capsule (300 mg total) by mouth every morning AND 1 capsule (300 mg total) daily with lunch AND 2 capsules (600 mg total) at bedtime. po qAM, one po qPM and two po qHS.Marland Kitchen    Dispense:  360 capsule    Refill:  3    Please send a replace/new response with 100-Day Supply if appropriate to maximize member  benefit. Requesting 1 year supply.    Order Specific Question:   Supervising Provider    Answer:   Anson Fret [[id number]]   tolterodine (DETROL LA) 4 MG 24 hr capsule    Sig: Take 1 capsule (4 mg total) by mouth daily.    Dispense:  90 capsule    Refill:  3    Order Specific Question:   Supervising Provider    Answer:   Anson Fret [[id number]]      AOZ HYQMV, FNP-C 06/21/2023, 2:51 PM [facility] [address], [address] [place], [place] [address] [phone]

## 2023-06-17 NOTE — Patient Instructions (Signed)
Below is our plan:  We will continue current treatment plan.   Please make sure you are staying well hydrated. I recommend 50-60 ounces daily. Well balanced diet and regular exercise encouraged. Consistent sleep schedule with 6-8 hours recommended.   Please continue follow up with care team as directed.   Follow up with Dr Sater in 6 months   You may receive a survey regarding today's visit. I encourage you to leave honest feed back as I do use this information to improve patient care. Thank you for seeing me today!    

## 2023-06-21 ENCOUNTER — Ambulatory Visit (INDEPENDENT_AMBULATORY_CARE_PROVIDER_SITE_OTHER): Payer: 59 | Admitting: Family Medicine

## 2023-06-21 ENCOUNTER — Telehealth: Payer: Self-pay | Admitting: *Deleted

## 2023-06-21 ENCOUNTER — Encounter: Payer: Self-pay | Admitting: Family Medicine

## 2023-06-21 VITALS — BP 102/70 | HR 78 | Ht 67.0 in | Wt 183.5 lb

## 2023-06-21 DIAGNOSIS — G35 Multiple sclerosis: Secondary | ICD-10-CM | POA: Diagnosis not present

## 2023-06-21 MED ORDER — AMANTADINE HCL 100 MG PO CAPS: 100.0000 mg | ORAL_CAPSULE | Freq: Two times a day (BID) | ORAL | 3 refills | Status: DC

## 2023-06-21 MED ORDER — BACLOFEN 10 MG PO TABS: 10.0000 mg | ORAL_TABLET | Freq: Three times a day (TID) | ORAL | 3 refills | Status: DC

## 2023-06-21 MED ORDER — TOLTERODINE TARTRATE ER 4 MG PO CP24: 4.0000 mg | ORAL_CAPSULE | Freq: Every day | ORAL | 3 refills | Status: DC

## 2023-06-21 MED ORDER — GABAPENTIN 300 MG PO CAPS: ORAL_CAPSULE | ORAL | 3 refills | Status: DC

## 2023-06-21 NOTE — Addendum Note (Signed)
Addended by: Shawnie Dapper L on: 06/21/2023 04:54 PM   Modules accepted: Orders

## 2023-06-21 NOTE — Telephone Encounter (Signed)
Called [facility] to see what dose of baclofen pt filling and how often per AL,NP request. Spoke w/ tech. Last refilled 10mg  1 po TID (04/14/23) qty 270. No refills remaining. No other baclofen rx's on file.   Called [facility] at [phone]. Spoke w/ pharmacist. They are not filling baclofen for pt.

## 2023-06-22 LAB — CBC WITH DIFFERENTIAL/PLATELET
Basophils Absolute: 0 10*3/uL (ref 0.0–0.2)
Basos: 1 %
EOS (ABSOLUTE): 0.1 10*3/uL (ref 0.0–0.4)
Eos: 1 %
Hematocrit: 40.1 % (ref 34.0–46.6)
Hemoglobin: 13 g/dL (ref 11.1–15.9)
Immature Grans (Abs): 0 10*3/uL (ref 0.0–0.1)
Immature Granulocytes: 0 %
Lymphocytes Absolute: 2.4 10*3/uL (ref 0.7–3.1)
Lymphs: 34 %
MCH: 27.6 pg (ref 26.6–33.0)
MCHC: 32.4 g/dL (ref 31.5–35.7)
MCV: 85 fL (ref 79–97)
Monocytes Absolute: 0.6 10*3/uL (ref 0.1–0.9)
Monocytes: 9 %
Neutrophils Absolute: 3.9 10*3/uL (ref 1.4–7.0)
Neutrophils: 55 %
Platelets: 295 10*3/uL (ref 150–450)
RBC: 4.71 x10E6/uL (ref 3.77–5.28)
RDW: 12.4 % (ref 11.7–15.4)
WBC: 7 10*3/uL (ref 3.4–10.8)

## 2023-06-22 LAB — IGG, IGA, IGM
IgA/Immunoglobulin A, Serum: 341 mg/dL (ref 87–352)
IgG (Immunoglobin G), Serum: 1337 mg/dL (ref 586–1602)
IgM (Immunoglobulin M), Srm: 52 mg/dL (ref 26–217)

## 2023-06-28 DIAGNOSIS — H401131 Primary open-angle glaucoma, bilateral, mild stage: Secondary | ICD-10-CM | POA: Diagnosis not present

## 2023-06-30 ENCOUNTER — Other Ambulatory Visit: Payer: Self-pay | Admitting: Neurology

## 2023-06-30 DIAGNOSIS — M150 Primary generalized (osteo)arthritis: Secondary | ICD-10-CM

## 2023-06-30 NOTE — Telephone Encounter (Signed)
Last seen on 06/21/23 Follow up scheduled on 01/03/24 Too soon to refill, next refill due to end of Dec.

## 2023-08-11 ENCOUNTER — Other Ambulatory Visit: Payer: Self-pay | Admitting: Neurology

## 2023-08-31 ENCOUNTER — Other Ambulatory Visit: Payer: Self-pay | Admitting: Neurology

## 2023-08-31 DIAGNOSIS — M150 Primary generalized (osteo)arthritis: Secondary | ICD-10-CM

## 2023-09-06 ENCOUNTER — Encounter: Payer: Self-pay | Admitting: *Deleted

## 2023-09-07 ENCOUNTER — Other Ambulatory Visit: Payer: Self-pay | Admitting: *Deleted

## 2023-09-07 DIAGNOSIS — G35 Multiple sclerosis: Secondary | ICD-10-CM

## 2023-09-07 MED ORDER — OCREVUS 300 MG/10ML IV SOLN: 600.0000 mg | INTRAVENOUS | 0 refills | Status: DC

## 2023-09-07 NOTE — Telephone Encounter (Signed)
 Holly/[facility] asked rx be sent to [facility] for Ocrevus. She spoke w/ pt sister.

## 2023-09-20 DIAGNOSIS — G35 Multiple sclerosis: Secondary | ICD-10-CM | POA: Diagnosis not present

## 2023-10-20 ENCOUNTER — Telehealth: Payer: Self-pay | Admitting: *Deleted

## 2023-10-20 NOTE — Telephone Encounter (Signed)
 [employer] [place] filled out and signed by Dr.Sater. copy made and left in file cabinet. Original copy placed in medical records box.

## 2023-10-20 NOTE — Telephone Encounter (Signed)
 Pt GTA form ready for p/u

## 2023-11-01 DIAGNOSIS — H25813 Combined forms of age-related cataract, bilateral: Secondary | ICD-10-CM | POA: Diagnosis not present

## 2023-11-01 DIAGNOSIS — H401131 Primary open-angle glaucoma, bilateral, mild stage: Secondary | ICD-10-CM | POA: Diagnosis not present

## 2023-11-01 DIAGNOSIS — H16223 Keratoconjunctivitis sicca, not specified as Sjogren's, bilateral: Secondary | ICD-10-CM | POA: Diagnosis not present

## 2023-11-01 DIAGNOSIS — H04123 Dry eye syndrome of bilateral lacrimal glands: Secondary | ICD-10-CM | POA: Diagnosis not present

## 2023-12-30 ENCOUNTER — Ambulatory Visit: Payer: 59

## 2024-01-03 ENCOUNTER — Encounter: Payer: Self-pay | Admitting: Neurology

## 2024-01-03 ENCOUNTER — Ambulatory Visit (INDEPENDENT_AMBULATORY_CARE_PROVIDER_SITE_OTHER): Payer: 59 | Admitting: Neurology

## 2024-01-03 VITALS — BP 112/68 | HR 64 | Ht 67.0 in | Wt 198.0 lb

## 2024-01-03 DIAGNOSIS — Z79899 Other long term (current) drug therapy: Secondary | ICD-10-CM

## 2024-01-03 DIAGNOSIS — M21371 Foot drop, right foot: Secondary | ICD-10-CM | POA: Diagnosis not present

## 2024-01-03 DIAGNOSIS — E559 Vitamin D deficiency, unspecified: Secondary | ICD-10-CM

## 2024-01-03 DIAGNOSIS — R260 Ataxic gait: Secondary | ICD-10-CM

## 2024-01-03 DIAGNOSIS — R269 Unspecified abnormalities of gait and mobility: Secondary | ICD-10-CM | POA: Diagnosis not present

## 2024-01-03 DIAGNOSIS — R3915 Urgency of urination: Secondary | ICD-10-CM | POA: Diagnosis not present

## 2024-01-03 DIAGNOSIS — G35 Multiple sclerosis: Secondary | ICD-10-CM

## 2024-01-03 DIAGNOSIS — F32A Depression, unspecified: Secondary | ICD-10-CM

## 2024-01-03 NOTE — Patient Instructions (Signed)
 For constipation, take one scoop of Miralax (or store brand) every other night.   Depending on results can increase to one scoop every night.

## 2024-01-03 NOTE — Progress Notes (Signed)
 [facility]  PATIENT: Mary Jackson DOB: [birth year]  REFERRING DOCTOR OR PCP:  Dr. Romualdo SOURCE: Patient, extensive notes from neurology, imaging and laboratory reports, MRI images personally reviewed.  _________________________________   HISTORICAL  CHIEF COMPLAINT:  Chief Complaint  Patient presents with   RM10/MS    Pt is here with her Sister . Pt states that she had fallen yesterday. Pt states that she feels like her medication is working. Pt is wanting to get her Vitamin  D ordered so she won't have to keep going to the store. Pt states she is her neck and her back are hurting her as well as her feet. Pt states that her toe is throbbing right now and she is having a shooting pain. Pt states that her left side is bothering her. Pt states that her back is hurting her now.     HISTORY OF PRESENT ILLNESS:  Mary Jackson is a 43 y.o. woman with relapsing/active form of secondary progressive multiple sclerosis.  Update 01/03/2024 She is on Ocrevus  (last infusion 09/2023 and next one is September 2025).  She has tolerated it well.   She sometimes feels more tired at the end of each cycle.  Due to converting to JCV Ab positive, she switched from Tysabri  to Ocrevus .    JCV antibody 03/25/2020 was 1.43 and 1.37 a second tine 03/2021 (middle positive)      She uses a walker due to right > left  leg weakness.  She has a right foot drop.  She denies spasticity.   She sometimes stumbles due to the foot drop and she ha fall every couple months.    Ampyra  bid helps her some.   She feels her balance is poor..   Baclofen  has helped her leg spasms and gabapentin  tid has helped her dysesthesias.     She has urinary urgency but no recent incontinence.  She had nocturia x 3 times most nights. But on tolterodine  his is less of a problem.   She reports reduced vision OS .    She rarely has diplopia.    She has a dysconjugate gaze  She is often fatigued.  She has nocturia x 3 and  sometimes has trouble falling back asleep.  She also has trouble falling asleep and seldom goes to sleep before 1 am .  She does not snore.      She has had some depression, helped by sertraline .        She gets occasional spasm of left back pain.     MS History:   She was diagnosed with MS in 2008 after presenting with left leg weakness and clumsiness and a fall.  Imaging studies were performed and she was diagnosed.   She was initially placed on Copaxone but she had multiple relapses.   She was placed on Tysabri  and is now on every 6 weeks.   Imaging:  MRI's of the brain dated 4/2 2019 and 04/09/2016.  The MRI showed multiple T2/flair hyperintense foci with single and confluent lesions in the periventricular, juxtacortical and deep white matter and also is a few foci in the infratentorial white matter and apparently at the cervical medullary junction.    We do not have an MRI of the cervical spine.  MRI report from 2015 did mention that there were new lesions compared to the previous MRI.  MRI of the brain 04/29/2020 showed Multiple single and confluent T2/FLAIR hyperintense foci in the hemispheres and a few foci  in the cerebellum in a pattern configuration consistent with advanced chronic demyelinating plaque related to multiple sclerosis.  None of the foci appear to be acute and there were no changes compared to the 2019 MRI.     Generalized cortical and corpus callosum atrophy.  MRI of the cervical spine 04/29/2020 showed patchy T2 hyperintense signal throughout the cervical spinal cord.  Mild multilevel degenerative changes but no spinal stenosis or nerve root compression  MRI brain 02/01/2023 showed Extensive T2/FLAIR hyperintense signal and confluent foci in the cerebral hemispheres and discrete foci in the cerebellar hemispheres and left anteromedial thalamus in a pattern consistent with chronic demyelinating plaque associated with multiple sclerosis. None of the foci enhanced or appear to be  acute. Compared to the MRI from 04/29/2020, there appear to be 1 new focus in the left frontal lobe.[name]   MRI cervical spine  02/01/2023 showed Mild spinal cord atrophy and patchy T2 hyperintense signal throughout the cervical spinal cord.  None of the changes appears to be acute and they did not appear to be progression compared to the 02/01/2023 MRI.  These are consistent with chronic demyelinating plaque associated with her multiple sclerosis.   Minimal multilevel degenerative changes not leading to spinal stenosis or nerve root compression.  This is stable compared to the 2021 MRI.    She has no FH of MS.   Parents have HTN.     REVIEW OF SYSTEMS: Constitutional: No fevers, chills, sweats, or change in appetite Eyes: No visual changes, double vision, eye pain Ear, nose and throat: No hearing loss, ear pain, nasal congestion, sore throat Cardiovascular: No chest pain, palpitations Respiratory:  No shortness of breath at rest or with exertion.   No wheezes GastrointestinaI: No nausea, vomiting, diarrhea, abdominal pain, fecal incontinence Genitourinary:  No dysuria, urinary retention or frequency.  No nocturia. Musculoskeletal:  No neck pain, back pain Integumentary: No rash, pruritus, skin lesions Neurological: as above Psychiatric: No depression at this time.  No anxiety Endocrine: No palpitations, diaphoresis, change in appetite, change in weigh or increased thirst Hematologic/Lymphatic:  No anemia, purpura, petechiae. Allergic/Immunologic: No itchy/runny eyes, nasal congestion, recent allergic reactions, rashes  ALLERGIES: No Known Allergies  HOME MEDICATIONS:  Current Outpatient Medications:    amantadine  (SYMMETREL ) 100 MG capsule, Take 1 capsule (100 mg total) by mouth 2 (two) times daily., Disp: 180 capsule, Rfl: 3   baclofen  (LIORESAL ) 10 MG tablet, Take 1 tablet (10 mg total) by mouth 3 (three) times daily., Disp: 270 tablet, Rfl: 3   Cholecalciferol (VITAMIN D3 PO), Take  5,000 Units by mouth daily., Disp: , Rfl:    dalfampridine  10 MG TB12, TAKE 1 TABLET BY MOUTH TWICE  DAILY, Disp: 180 tablet, Rfl: 2   ferrous sulfate 324 MG TBEC, Take 324 mg by mouth., Disp: , Rfl:    gabapentin  (NEURONTIN ) 300 MG capsule, Take 1 capsule (300 mg total) by mouth every morning AND 1 capsule (300 mg total) daily with lunch AND 2 capsules (600 mg total) at bedtime. po qAM, one po qPM and two po qHS.., Disp: 360 capsule, Rfl: 3   ibuprofen  (ADVIL ) 600 MG tablet, TAKE 1 TABLET BY MOUTH EVERY 8  HOURS AS NEEDED FOR MODERATE  PAIN, Disp: 270 tablet, Rfl: 3   ocrelizumab  (OCREVUS ) 300 MG/10ML injection, Inject 20 mLs (600 mg total) into the vein every 6 (six) months., Disp: 20 mL, Rfl: 0   sertraline  (ZOLOFT ) 100 MG tablet, Take 100 mg by mouth daily., Disp: , Rfl:  tolterodine  (DETROL  LA) 4 MG 24 hr capsule, Take 1 capsule (4 mg total) by mouth daily., Disp: 90 capsule, Rfl: 3   clobetasol  (OLUX ) 0.05 % topical foam, Apply topically 2 (two) times daily. (Patient not taking: Reported on 01/03/2024), Disp: 100 g, Rfl: 3   ketoconazole  (NIZORAL ) 2 % shampoo, APPLY 1 APPLICATION TOPICALLY 2 TIMES A WEEK (Patient not taking: Reported on 01/03/2024), Disp: 120 mL, Rfl: 0  PAST MEDICAL HISTORY: Past Medical History:  Diagnosis Date   Depression with anxiety    Migraines    MS (multiple sclerosis) (HCC)     PAST SURGICAL HISTORY:   FAMILY HISTORY: Family History  Problem Relation Age of Onset   Hypertension Sister    Diabetes Sister    Multiple sclerosis Neg Hx     SOCIAL HISTORY:  Social History   Socioeconomic History   Marital status: Single    Spouse name: Not on file   Number of children: Not on file   Years of education: Not on file   Highest education level: Not on file  Occupational History   Not on file  Tobacco Use   Smoking status: Every Day    Types: Cigarettes   Smokeless tobacco: Never   Tobacco comments:    3-4 cigs per day  Substance and Sexual  Activity   Alcohol use: Yes   Drug use: Not Currently    Comment: occ   Sexual activity: Not Currently    Partners: Male    Birth control/protection: Abstinence  Other Topics Concern   Not on file  Social History Narrative   Right handed   Caffeine use: coffee, tea, soda   Lives with sister, Angeline   Social Drivers of Health   Financial Resource Strain: Low Risk  (12/24/2022)   Overall Financial Resource Strain (CARDIA)    Difficulty of Paying Living Expenses: Not hard at all  Food Insecurity: No Food Insecurity (12/24/2022)   Hunger Vital Sign    Worried About Running Out of Food in the Last Year: Never true    Ran Out of Food in the Last Year: Never true  Transportation Needs: No Transportation Needs (12/24/2022)   PRAPARE - Administrator, Civil Service (Medical): No    Lack of Transportation (Non-Medical): No  Physical Activity: Inactive (12/24/2022)   Exercise Vital Sign    Days of Exercise per Week: 0 days    Minutes of Exercise per Session: 0 min  Stress: No Stress Concern Present (12/24/2022)   [employer] of Occupational Health - Occupational Stress Questionnaire    Feeling of Stress : Not at all  Social Connections: Moderately Integrated (12/24/2022)   Social Connection and Isolation Panel    Frequency of Communication with Friends and Family: Three times a week    Frequency of Social Gatherings with Friends and Family: Once a week    Attends Religious Services: 1 to 4 times per year    Active Member of [employer] or Organizations: Yes    Attends Banker Meetings: 1 to 4 times per year    Marital Status: Never married  Intimate Partner Violence: Not At Risk (12/24/2022)   Humiliation, Afraid, Rape, and Kick questionnaire    Fear of Current or Ex-Partner: No    Emotionally Abused: No    Physically Abused: No    Sexually Abused: No     PHYSICAL EXAM  Vitals:   01/03/24 1056  BP: 112/68  Pulse: 64  SpO2: 99%  Weight: 198 lb (89.8 kg)   Height: 5' 7 (1.702 m)    Body mass index is 31.01 kg/m.   General: The patient is well-developed and well-nourished and in no acute distress  HEENT:  Head is [place]/AT.  Sclera are anicteric.     Neck: Good range of motion.  Neurologic Exam  Mental status: The patient is alert and oriented x 3 at the time of the examination. The patient has apparent normal recent and remote memory, with an apparently normal attention span and concentration ability.   Speech is normal.  Cranial nerves: Extraocular movements show nystagmus on lateral gaze with slight disconjugate gaze looking right..   Facial strength is normal.  Trapezius and sternocleidomastoid strength is normal. No dysarthria is noted.    No obvious hearing deficits are noted.  Motor:  Muscle bulk is normal.   Tone is normal.  Strength is 5/5 in the arms, 4/5 proximally in the right and 4+ left iliopsoas ad 4-4+ elsewhere.     Sensory: Sensory testing is intact to vibration sensation in arms but mild reduced touch in left arm and leg.   Coordination: She has mildly reduced left finger-nose-finger and moderately  reduced heel-to-shin, worse on right .  Gait and station: She uses  hands to stand up from the chair.  She has an ataxic gait. Needs support to take > 2 steps.    With a walker, she can go at least 100 feet.  She is unable to do tandem walk.  The Romberg is positive.  Reflexes: Deep tendon reflexes are mildly increased in the arms  Reflexes are increased with crossed abductor responses at the knees, right > left  She has a couple beats of nonsustained ankle clonus bilaterally..       ASSESSMENT AND PLAN  Secondary progressive multiple sclerosis (HCC)  Gait disturbance  Right foot drop  Ataxic gait  High risk medication use  Vitamin D  deficiency  Urinary urgency  Depression, unspecified depression type   1.   She will continue Ocrevus .  We will check lab work today.  Around the time of the next visit, we  will check MRI of the brain to determine if there is subclinical progression.  If this is occurring, consider a different disease modifying therapy.   Also , at next visit check IgG/IgM and CBC/D 2.   Continue gabapentin , baclofen , sertraline .   Continue tolterodine  for bladder. 3.   Stay active and exercise as tolerated.   4.   RTC 6 months, sooner if new or worsening issues.  This visit is part of a comprehensive longitudinal care medical relationship regarding the patients primary diagnosis of MS and related concerns.   Wing Gfeller A. Vear, MD, [facility] 01/03/2024, 11:20 AM Certified in Neurology, Clinical Neurophysiology, Sleep Medicine and Neuroimaging  [facility] [address], [address] [place], [place] [address] [phone]

## 2024-01-04 ENCOUNTER — Ambulatory Visit: Payer: Self-pay | Admitting: Neurology

## 2024-01-04 LAB — CBC WITH DIFFERENTIAL/PLATELET
Basophils Absolute: 0 10*3/uL (ref 0.0–0.2)
Basos: 1 %
EOS (ABSOLUTE): 0.1 10*3/uL (ref 0.0–0.4)
Eos: 1 %
Hematocrit: 38 % (ref 34.0–46.6)
Hemoglobin: 12.3 g/dL (ref 11.1–15.9)
Immature Grans (Abs): 0 10*3/uL (ref 0.0–0.1)
Immature Granulocytes: 0 %
Lymphocytes Absolute: 1.7 10*3/uL (ref 0.7–3.1)
Lymphs: 32 %
MCH: 28 pg (ref 26.6–33.0)
MCHC: 32.4 g/dL (ref 31.5–35.7)
MCV: 86 fL (ref 79–97)
Monocytes Absolute: 0.4 10*3/uL (ref 0.1–0.9)
Monocytes: 8 %
Neutrophils Absolute: 3 10*3/uL (ref 1.4–7.0)
Neutrophils: 58 %
Platelets: 266 10*3/uL (ref 150–450)
RBC: 4.4 x10E6/uL (ref 3.77–5.28)
RDW: 13.1 % (ref 11.7–15.4)
WBC: 5.3 10*3/uL (ref 3.4–10.8)

## 2024-01-04 LAB — IGG, IGA, IGM
IgA/Immunoglobulin A, Serum: 313 mg/dL (ref 87–352)
IgG (Immunoglobin G), Serum: 1234 mg/dL (ref 586–1602)
IgM (Immunoglobulin M), Srm: 49 mg/dL (ref 26–217)

## 2024-01-04 LAB — VITAMIN D 25 HYDROXY (VIT D DEFICIENCY, FRACTURES): Vit D, 25-Hydroxy: 38.9 ng/mL (ref 30.0–100.0)

## 2024-01-04 NOTE — Telephone Encounter (Signed)
 Spoke with patient sister Angeline and informed her with the below. She will relay to patient.

## 2024-01-04 NOTE — Telephone Encounter (Signed)
-----   Message from Charlie DELENA Crete sent at 01/04/2024  9:00 AM EDT ----- Please let them know that the blood work looked good.  The vitamin D  was normal but in the low normal range.  Therefore, she should take 1 or 2000 units of vitamin D  daily ----- Message ----- From: Interface, [facility] Lab Results In Sent: 01/04/2024   5:36 AM EDT To: Charlie DELENA Crete, MD

## 2024-01-04 NOTE — Telephone Encounter (Addendum)
 Called pt at [phone]. Voicemail full and unable to LVM.  This is same number listed for sister/Regina (on DPR).

## 2024-01-12 ENCOUNTER — Ambulatory Visit

## 2024-01-13 ENCOUNTER — Ambulatory Visit (INDEPENDENT_AMBULATORY_CARE_PROVIDER_SITE_OTHER)

## 2024-01-13 VITALS — Ht 67.0 in | Wt 198.0 lb

## 2024-01-13 DIAGNOSIS — Z0000 Encounter for general adult medical examination without abnormal findings: Secondary | ICD-10-CM

## 2024-01-13 NOTE — Progress Notes (Signed)
 Subjective:   Mary Jackson is a 43 y.o. who presents for a Medicare Wellness preventive visit.  As a reminder, Annual Wellness Visits don't include a physical exam, and some assessments may be limited, especially if this visit is performed virtually. We may recommend an in-person follow-up visit with your provider if needed.  Visit Complete: Virtual I connected with  Mary Jackson on 01/13/24 by a audio enabled telemedicine application and verified that I am speaking with the correct person using two identifiers.  Patient Location: Home  Provider Location: Office/Clinic  I discussed the limitations of evaluation and management by telemedicine. The patient expressed understanding and agreed to proceed.  Vital Signs: Because this visit was a virtual/telehealth visit, some criteria may be missing or patient reported. Any vitals not documented were not able to be obtained and vitals that have been documented are patient reported.  VideoDeclined- This patient declined Librarian, academic. Therefore the visit was completed with audio only.  Persons Participating in Visit: Patient.  AWV Questionnaire: No: Patient Medicare AWV questionnaire was not completed prior to this visit.  Cardiac Risk Factors include: advanced age (>71men, >84 women);dyslipidemia;smoking/ tobacco exposure     Objective:    Today's Vitals   01/13/24 0854  Weight: 198 lb (89.8 kg)  Height: 5' 7 (1.702 m)   Body mass index is 31.01 kg/m.     01/13/2024    8:54 AM 12/24/2022    2:33 PM 06/17/2021    7:03 AM 06/09/2021   12:04 PM 04/21/2019   11:10 AM  Advanced Directives  Does Patient Have a Medical Advance Directive? Yes Yes Yes Yes No  Type of Estate agent of [place];Living will Healthcare Power of [employer] Power of [employer] Power of Attorney   Does patient want to make changes to medical advance directive?    No - Patient declined    Copy of Healthcare Power of Attorney in Chart? No - copy requested No - copy requested No - copy requested No - copy requested     Current Medications (verified) Outpatient Encounter Medications as of 01/13/2024  Medication Sig   amantadine  (SYMMETREL ) 100 MG capsule Take 1 capsule (100 mg total) by mouth 2 (two) times daily.   baclofen  (LIORESAL ) 10 MG tablet Take 1 tablet (10 mg total) by mouth 3 (three) times daily.   Cholecalciferol (VITAMIN D3 PO) Take 5,000 Units by mouth daily.   clobetasol  (OLUX ) 0.05 % topical foam Apply topically 2 (two) times daily.   dalfampridine  10 MG TB12 TAKE 1 TABLET BY MOUTH TWICE  DAILY   ferrous sulfate 324 MG TBEC Take 324 mg by mouth.   gabapentin  (NEURONTIN ) 300 MG capsule Take 1 capsule (300 mg total) by mouth every morning AND 1 capsule (300 mg total) daily with lunch AND 2 capsules (600 mg total) at bedtime. po qAM, one po qPM and two po qHS..   ibuprofen  (ADVIL ) 600 MG tablet TAKE 1 TABLET BY MOUTH EVERY 8  HOURS AS NEEDED FOR MODERATE  PAIN   ketoconazole  (NIZORAL ) 2 % shampoo APPLY 1 APPLICATION TOPICALLY 2 TIMES A WEEK   ocrelizumab  (OCREVUS ) 300 MG/10ML injection Inject 20 mLs (600 mg total) into the vein every 6 (six) months.   sertraline  (ZOLOFT ) 100 MG tablet Take 100 mg by mouth daily.   tolterodine  (DETROL  LA) 4 MG 24 hr capsule Take 1 capsule (4 mg total) by mouth daily.   No facility-administered encounter medications on file as  of 01/13/2024.    Allergies (verified) Patient has no known allergies.   History: Past Medical History:  Diagnosis Date   Depression with anxiety    Migraines    MS (multiple sclerosis) (HCC)    Past Surgical History:  Procedure Laterality Date   BREAST BIOPSY Right 03/2021   BREAST EXCISIONAL BIOPSY Right 06/17/2021   BREAST LUMPECTOMY WITH RADIOACTIVE SEED LOCALIZATION Right 06/17/2021   Procedure: RIGHT BREAST LUMPECTOMY WITH RADIOACTIVE SEED LOCALIZATION;  Surgeon: Curvin Deward MOULD, MD;  Location:  [facility];  Service: General;  Laterality: Right;   Family History  Problem Relation Age of Onset   Hypertension Sister    Diabetes Sister    Multiple sclerosis Neg Hx    Social History   Socioeconomic History   Marital status: Single    Spouse name: Not on file   Number of children: Not on file   Years of education: Not on file   Highest education level: Not on file  Occupational History   Not on file  Tobacco Use   Smoking status: Every Day    Current packs/day: 1.00    Average packs/day: 1 pack/day for 29.5 years (29.5 ttl pk-yrs)    Types: Cigarettes    Start date: 07/13/1994   Smokeless tobacco: Never   Tobacco comments:    3-4 cigs per day  Substance and Sexual Activity   Alcohol use: Not Currently   Drug use: Not Currently    Comment: occ   Sexual activity: Not Currently    Partners: Male    Birth control/protection: Abstinence  Other Topics Concern   Not on file  Social History Narrative   Right handed   Caffeine use: coffee, tea, soda   Lives with sister, Angeline   Social Drivers of Health   Financial Resource Strain: Low Risk  (01/13/2024)   Overall Financial Resource Strain (CARDIA)    Difficulty of Paying Living Expenses: Not hard at all  Food Insecurity: No Food Insecurity (01/13/2024)   Hunger Vital Sign    Worried About Running Out of Food in the Last Year: Never true    Ran Out of Food in the Last Year: Never true  Transportation Needs: No Transportation Needs (01/13/2024)   PRAPARE - Administrator, Civil Service (Medical): No    Lack of Transportation (Non-Medical): No  Physical Activity: Insufficiently Active (01/13/2024)   Exercise Vital Sign    Days of Exercise per Week: 1 day    Minutes of Exercise per Session: 10 min  Stress: No Stress Concern Present (01/13/2024)   [employer] of Occupational Health - Occupational Stress Questionnaire    Feeling of Stress: Not at all  Social Connections: Socially Isolated  (01/13/2024)   Social Connection and Isolation Panel    Frequency of Communication with Friends and Family: Three times a week    Frequency of Social Gatherings with Friends and Family: Once a week    Attends Religious Services: Never    Database administrator or Organizations: No    Attends Engineer, structural: Never    Marital Status: Never married    Tobacco Counseling Ready to quit: No Counseling given: Yes Tobacco comments: 3-4 cigs per day    Clinical Intake:  Pre-visit preparation completed: Yes  Pain : No/denies pain     BMI - recorded: 31.01 Nutritional Status: BMI > 30  Obese Nutritional Risks: None Diabetes: No  No results found for: HGBA1C   How  often do you need to have someone help you when you read instructions, pamphlets, or other written materials from your doctor or pharmacy?: 1 - Never  Interpreter Needed?: No  Information entered by :: Verdie Saba, CMA   Activities of Daily Living     01/13/2024    8:59 AM  In your present state of health, do you have any difficulty performing the following activities:  Hearing? 0  Vision? 0  Difficulty concentrating or making decisions? 0  Walking or climbing stairs? 0  Dressing or bathing? 0  Doing errands, shopping? 0  Preparing Food and eating ? N  Using the Toilet? N  In the past six months, have you accidently leaked urine? N  Do you have problems with loss of bowel control? N  Managing your Medications? N  Managing your Finances? N  Housekeeping or managing your Housekeeping? N    Patient Care Team: Joshua Debby CROME, MD as PCP - General (Internal Medicine) Sater, Charlie LABOR, MD (Neurology) Joane Artist RAMAN, MD as Consulting Physician (Sports Medicine) Lavoie, Marie-Lyne, MD as Consulting Physician (Obstetrics and Gynecology) Fleeta Zerita DASEN, MD as Consulting Physician (Ophthalmology)  I have updated your Care Teams any recent Medical Services you may have received from other providers in  the past year.     Assessment:   This is a routine wellness examination for [place].  Hearing/Vision screen Hearing Screening - Comments:: Denies hearing difficulties   Vision Screening - Comments:: Wears rx glasses - up to date with routine eye exams with Dr Fleeta   Goals Addressed               This Visit's Progress     Patient Stated (pt-stated)        Patient stated she plans to exercise more and use the walker less       Depression Screen     01/13/2024    9:06 AM 12/24/2022    2:35 PM 12/15/2021   10:54 AM 07/21/2021   11:03 AM 12/03/2020   10:45 AM  PHQ 2/9 Scores  PHQ - 2 Score 0 0 0 0 0  PHQ- 9 Score 3 0 0 0     Fall Risk     01/13/2024    9:00 AM 12/24/2022    2:34 PM 12/15/2021   10:56 AM  Fall Risk   Falls in the past year? 1 1 0  Number falls in past yr: 0 1 0  Comment 1    Injury with Fall? 0 0 0  Risk for fall due to : Impaired balance/gait History of fall(s)   Follow up Falls evaluation completed;Falls prevention discussed      MEDICARE RISK AT HOME:  Medicare Risk at Home Any stairs in or around the home?: Yes (outside) If so, are there any without handrails?: No Home free of loose throw rugs in walkways, pet beds, electrical cords, etc?: Yes Adequate lighting in your home to reduce risk of falls?: Yes Life alert?: No Use of a cane, walker or w/c?: Yes (cane/walker) Grab bars in the bathroom?: No Shower chair or bench in shower?: No Elevated toilet seat or a handicapped toilet?: Yes  TIMED UP AND GO:  Was the test performed?  No  Cognitive Function: 6CIT completed        01/13/2024    9:04 AM 12/24/2022    2:36 PM 12/15/2021   10:56 AM  6CIT Screen  What Year? 0 points 0 points 0 points  What  month? 0 points 0 points 0 points  What time? 0 points 0 points 0 points  Count back from 20 0 points 0 points 0 points  Months in reverse 0 points 0 points 0 points  Repeat phrase 0 points 0 points 10 points  Total Score 0 points 0 points 10 points     Immunizations Immunization History  Administered Date(s) Administered   Influenza,inj,Quad PF,6+ Mos 07/21/2021   PFIZER(Purple Top)SARS-COV-2 Vaccination 10/01/2019, 10/22/2019, 06/01/2020   PNEUMOCOCCAL CONJUGATE-20 12/03/2020   Tdap 12/03/2020    Screening Tests Health Maintenance  Topic Date Due   Hepatitis B Vaccines (1 of 3 - 19+ 3-dose series) Never done   HPV VACCINES (1 - Risk 3-dose SCDM series) Never done   COVID-19 Vaccine (4 - 2024-25 season) 03/14/2023   INFLUENZA VACCINE  02/11/2024   Cervical Cancer Screening (HPV/Pap Cotest)  03/25/2024   Medicare Annual Wellness (AWV)  01/12/2025   DTaP/Tdap/Td (2 - Td or Tdap) 12/04/2030   Pneumococcal Vaccine 66-74 Years old  Completed   Hepatitis C Screening  Completed   HIV Screening  Completed   Meningococcal B Vaccine  Aged Out    Health Maintenance  Health Maintenance Due  Topic Date Due   Hepatitis B Vaccines (1 of 3 - 19+ 3-dose series) Never done   HPV VACCINES (1 - Risk 3-dose SCDM series) Never done   COVID-19 Vaccine (4 - 2024-25 season) 03/14/2023   Health Maintenance Items Addressed:01/13/2024   Additional Screening:  Vision Screening: Recommended annual ophthalmology exams for early detection of glaucoma and other disorders of the eye. Would you like a referral to an eye doctor? No    Dental Screening: Recommended annual dental exams for proper oral hygiene  Community Resource Referral / Chronic Care Management: CRR required this visit?  No   CCM required this visit?  No   Plan:    I have personally reviewed and noted the following in the patient's chart:   Medical and social history Use of alcohol, tobacco or illicit drugs  Current medications and supplements including opioid prescriptions. Patient is not currently taking opioid prescriptions. Functional ability and status Nutritional status Physical activity Advanced directives List of other physicians Hospitalizations, surgeries,  and ER visits in previous 12 months Vitals Screenings to include cognitive, depression, and falls Referrals and appointments  In addition, I have reviewed and discussed with patient certain preventive protocols, quality metrics, and best practice recommendations. A written personalized care plan for preventive services as well as general preventive health recommendations were provided to patient.   Verdie CHRISTELLA Saba, CMA   01/13/2024   After Visit Summary: (MyChart) Due to this being a telephonic visit, the after visit summary with patients personalized plan was offered to patient via MyChart   Notes: Advised the pt to schedule an appt w/PCP.

## 2024-01-13 NOTE — Patient Instructions (Addendum)
 Mary Jackson , Thank you for taking time out of your busy schedule to complete your Annual Wellness Visit with me. I enjoyed our conversation and look forward to speaking with you again next year. I, as well as your care team,  appreciate your ongoing commitment to your health goals. Please review the following plan we discussed and let me know if I can assist you in the future. Your Game plan/ To Do List     Follow up Visits: Next Medicare AWV with our clinical staff: 01/17/2025   Have you seen your provider in the last 6 months (3 months if uncontrolled diabetes)? Yes Next Office Visit with your provider: to be scheduled by the patient  Clinician Recommendations:  Aim for 30 minutes of exercise or brisk walking, 6-8 glasses of water, and 5 servings of fruits and vegetables each day. Educated and advised on getting the Hepatitis B vaccines.      This is a list of the screening recommended for you and due dates:  Health Maintenance  Topic Date Due   Hepatitis B Vaccine (1 of 3 - 19+ 3-dose series) Never done   HPV Vaccine (1 - Risk 3-dose SCDM series) Never done   COVID-19 Vaccine (4 - 2024-25 season) 03/14/2023   Flu Shot  02/11/2024   Pap with HPV screening  03/25/2024   Medicare Annual Wellness Visit  01/12/2025   DTaP/Tdap/Td vaccine (2 - Td or Tdap) 12/04/2030   Pneumococcal Vaccination  Completed   Hepatitis C Screening  Completed   HIV Screening  Completed   Meningitis B Vaccine  Aged Out    Advanced directives: (Copy Requested) Please bring a copy of your health care power of attorney and living will to the office to be added to your chart at your convenience. You can mail to [facility] [address] [place], [place] [address] or email to [email or web address] Advance Care Planning is important because it:  [x]  Makes sure you receive the medical care that is consistent with your values, goals, and preferences  [x]  It provides guidance to your family and  loved ones and reduces their decisional burden about whether or not they are making the right decisions based on your wishes.  Follow the link provided in your after visit summary or read over the paperwork we have mailed to you to help you started getting your Advance Directives in place. If you need assistance in completing these, please reach out to us  so that we can help you!

## 2024-02-01 ENCOUNTER — Telehealth: Payer: Self-pay | Admitting: *Deleted

## 2024-02-01 ENCOUNTER — Other Ambulatory Visit: Payer: Self-pay

## 2024-02-01 MED ORDER — TOLTERODINE TARTRATE ER 4 MG PO CP24: 4.0000 mg | ORAL_CAPSULE | Freq: Every day | ORAL | 1 refills | Status: DC

## 2024-02-01 NOTE — Telephone Encounter (Signed)
  I called to speak with patient and sister Angeline and informed her that Rx is out of stock with [facility]. I asked if she wanted to try a local pharmacy, Angeline asked me to send Rx to local [facility]. Rx sent.   Angeline is aware to follow up if any issues.

## 2024-02-17 ENCOUNTER — Other Ambulatory Visit: Payer: Self-pay | Admitting: Neurology

## 2024-02-17 NOTE — Telephone Encounter (Signed)
 Last seen on 01/03/24 Follow up scheduled 07/25/24   Dispensed Days Supply Quantity Provider Pharmacy  DALFAMPRIDINE  ER  10 MG TB12 12/16/2023 90 180 tablet Sater, Charlie LABOR, MD [facility]    Requesting refill too soon.

## 2024-02-23 ENCOUNTER — Other Ambulatory Visit: Payer: Self-pay | Admitting: *Deleted

## 2024-02-23 DIAGNOSIS — G35 Multiple sclerosis: Secondary | ICD-10-CM

## 2024-02-23 MED ORDER — OCREVUS 300 MG/10ML IV SOLN: 600.0000 mg | INTRAVENOUS | 0 refills | Status: AC

## 2024-03-01 ENCOUNTER — Other Ambulatory Visit: Payer: Self-pay | Admitting: Neurology

## 2024-03-06 ENCOUNTER — Encounter: Payer: Self-pay | Admitting: Internal Medicine

## 2024-03-06 ENCOUNTER — Ambulatory Visit: Admitting: Internal Medicine

## 2024-03-06 VITALS — BP 106/68 | HR 83 | Temp 98.2°F | Resp 16 | Ht 67.0 in | Wt 200.2 lb

## 2024-03-06 DIAGNOSIS — G35 Multiple sclerosis: Secondary | ICD-10-CM

## 2024-03-06 DIAGNOSIS — E870 Hyperosmolality and hypernatremia: Secondary | ICD-10-CM

## 2024-03-06 DIAGNOSIS — L84 Corns and callosities: Secondary | ICD-10-CM

## 2024-03-06 DIAGNOSIS — L219 Seborrheic dermatitis, unspecified: Secondary | ICD-10-CM

## 2024-03-06 DIAGNOSIS — E785 Hyperlipidemia, unspecified: Secondary | ICD-10-CM

## 2024-03-06 DIAGNOSIS — M150 Primary generalized (osteo)arthritis: Secondary | ICD-10-CM | POA: Diagnosis not present

## 2024-03-06 DIAGNOSIS — L209 Atopic dermatitis, unspecified: Secondary | ICD-10-CM

## 2024-03-06 DIAGNOSIS — Z0001 Encounter for general adult medical examination with abnormal findings: Secondary | ICD-10-CM

## 2024-03-06 DIAGNOSIS — E559 Vitamin D deficiency, unspecified: Secondary | ICD-10-CM

## 2024-03-06 LAB — BASIC METABOLIC PANEL WITH GFR
BUN: 13 mg/dL (ref 6–23)
CO2: 26 meq/L (ref 19–32)
Calcium: 9.1 mg/dL (ref 8.4–10.5)
Chloride: 107 meq/L (ref 96–112)
Creatinine, Ser: 0.72 mg/dL (ref 0.40–1.20)
GFR: 101.51 mL/min (ref >60.00)
Glucose, Bld: 62 mg/dL — ABNORMAL LOW (ref 70–99)
Potassium: 4 meq/L (ref 3.5–5.1)
Sodium: 140 meq/L (ref 135–145)

## 2024-03-06 LAB — LIPID PANEL
Cholesterol: 155 mg/dL (ref 0–200)
HDL: 61.8 mg/dL (ref >39.00)
LDL Cholesterol: 83 mg/dL (ref 0–99)
NonHDL: 93.23
Total CHOL/HDL Ratio: 3
Triglycerides: 53 mg/dL (ref 0.0–149.0)
VLDL: 10.6 mg/dL (ref 0.0–40.0)

## 2024-03-06 LAB — HEPATIC FUNCTION PANEL
ALT: 12 U/L (ref 0–35)
AST: 14 U/L (ref 0–37)
Albumin: 4 g/dL (ref 3.5–5.2)
Alkaline Phosphatase: 49 U/L (ref 39–117)
Bilirubin, Direct: 0.1 mg/dL (ref 0.0–0.3)
Total Bilirubin: 0.3 mg/dL (ref 0.2–1.2)
Total Protein: 6.8 g/dL (ref 6.0–8.3)

## 2024-03-06 LAB — TSH: TSH: 1.02 u[IU]/mL (ref 0.35–5.50)

## 2024-03-06 LAB — VITAMIN D 25 HYDROXY (VIT D DEFICIENCY, FRACTURES): VITD: 38.62 ng/mL (ref 30.00–100.00)

## 2024-03-06 MED ORDER — KETOCONAZOLE 2 % EX SHAM: MEDICATED_SHAMPOO | CUTANEOUS | 5 refills | Status: AC

## 2024-03-06 MED ORDER — CLOBETASOL PROPIONATE 0.05 % EX FOAM
Freq: Two times a day (BID) | CUTANEOUS | 3 refills | Status: AC
Start: 2024-03-06 — End: ?

## 2024-03-06 NOTE — Patient Instructions (Signed)

## 2024-03-06 NOTE — Progress Notes (Signed)
 Subjective:  Patient ID: Mary Jackson, female    DOB: 02-Sep-1980  Age: 43 y.o. MRN: [id number]  CC: Annual Exam (Patient states that her feet and her toes are a problem. She has a pain in the left side of her body and she can't figure out where its coming from. ), Supplies (Patient states that she needs a motorized wheelchair, a shower chair, shower bar, shampoo and cream for her head, and feet. ), and Rash   HPI BELLATRIX DEVONSHIRE presents for a CPX and f/up ----  Discussed the use of AI scribe software for clinical note transcription with the patient, who gave verbal consent to proceed.  History of Present Illness Mary Jackson is a 43 year old female with multiple sclerosis who presents with weakness and mobility issues.  She experiences weakness primarily on the left side of her body, persisting for several months, causing difficulty lying on her right side due to discomfort on the left. She is under the care of a neurologist for her multiple sclerosis.  She has pain in her right knee that worsens with exercise and requires rubbing for relief, impacting her ability to stand and move comfortably.  She has calluses on both feet, with the left foot being more painful than the right, causing significant pain when walking.  She mentions having an ingrown toenail on her left foot.  She has a history of scalp issues for which she has used medical shampoo, but she has run out of the supply.  She has a history of falls, with the most recent occurring a couple of months ago, requiring assistance to get up after falling.  She experiences constipation, although she had a bowel movement this morning and some yesterday evening.  No fevers, chills, night sweats, chest pain, shortness of breath, or trouble eating and swallowing.   Patient suffers from MS which impairs their ability to perform daily activities like (toileting, feeding, dressing, grooming, bathing). In the  home. A (cane, walker, crutch) will not resolve issue with performing activities of daily living. A motorized wheelchair will allow patient to safely perform dialy activities.  <then select one of the following> 1. Patient can't self-propel the wheelchair while engaging in frequent activities such as (laundry, toileting, meals) which cannot be performed in a standard or lightweight wheelchair due to the weight of the chair.     Durable Medical Equipment  (From admission, onward)           Start     Ordered   03/08/24 0000  DME Wheelchair electric       Comments: Shower bars and Shower chair   03/08/24 1156              Outpatient Medications Prior to Visit  Medication Sig Dispense Refill   amantadine  (SYMMETREL ) 100 MG capsule Take 1 capsule (100 mg total) by mouth 2 (two) times daily. 180 capsule 3   baclofen  (LIORESAL ) 10 MG tablet Take 1 tablet (10 mg total) by mouth 3 (three) times daily. 270 tablet 3   Cholecalciferol (VITAMIN D3 PO) Take 5,000 Units by mouth daily.     dalfampridine  10 MG TB12 TAKE 1 TABLET BY MOUTH TWICE  DAILY 180 tablet 1   ferrous sulfate 324 MG TBEC Take 324 mg by mouth.     gabapentin  (NEURONTIN ) 300 MG capsule Take 1 capsule (300 mg total) by mouth every morning AND 1 capsule (  300 mg total) daily with lunch AND 2 capsules (600 mg total) at bedtime. po qAM, one po qPM and two po qHS.. 360 capsule 3   ibuprofen  (ADVIL ) 600 MG tablet TAKE 1 TABLET BY MOUTH EVERY 8  HOURS AS NEEDED FOR MODERATE  PAIN 270 tablet 3   ocrelizumab  (OCREVUS ) 300 MG/10ML injection Inject 20 mLs (600 mg total) into the vein every 6 (six) months. 20 mL 0   sertraline  (ZOLOFT ) 100 MG tablet Take 100 mg by mouth daily.     tolterodine  (DETROL  LA) 4 MG 24 hr capsule Take 1 capsule (4 mg total) by mouth daily. 90 capsule 1   clobetasol  (OLUX ) 0.05 % topical foam Apply topically 2 (two) times daily. 100 g 3   ketoconazole  (NIZORAL ) 2 % shampoo APPLY 1 APPLICATION TOPICALLY 2 TIMES A  WEEK 120 mL 0   No facility-administered medications prior to visit.    ROS Review of Systems  Constitutional:  Positive for fatigue. Negative for appetite change, chills, diaphoresis and fever.  HENT: Negative.    Eyes: Negative.   Respiratory: Negative.  Negative for cough, chest tightness, shortness of breath and wheezing.   Cardiovascular:  Negative for chest pain, palpitations and leg swelling.  Gastrointestinal: Negative.  Negative for abdominal pain, constipation, diarrhea, nausea and vomiting.  Endocrine: Negative.   Genitourinary: Negative.  Negative for difficulty urinating.  Musculoskeletal:  Positive for back pain and gait problem. Negative for arthralgias, joint swelling and myalgias.  Skin: Negative.   Neurological:  Positive for speech difficulty, weakness and numbness. Negative for dizziness, tremors and light-headedness.  Hematological:  Negative for adenopathy. Does not bruise/bleed easily.  Psychiatric/Behavioral:  Positive for behavioral problems, confusion and decreased concentration. Negative for hallucinations, sleep disturbance and suicidal ideas.     Objective:  BP 106/68 (BP Location: Left Arm, Patient Position: Sitting, Cuff Size: Normal)   Pulse 83   Temp 98.2 F (36.8 C) (Oral)   Resp 16   Ht 5' 7 (1.702 m)   Wt 200 lb 3.2 oz (90.8 kg)   LMP 02/28/2024 (Approximate)   SpO2 97%   BMI 31.36 kg/m   BP Readings from Last 3 Encounters:  03/06/24 106/68  01/03/24 112/68  06/21/23 102/70    Wt Readings from Last 3 Encounters:  03/06/24 200 lb 3.2 oz (90.8 kg)  01/13/24 198 lb (89.8 kg)  01/03/24 198 lb (89.8 kg)    Physical Exam Vitals reviewed.  Constitutional:      General: She is not in acute distress.    Appearance: She is ill-appearing. She is not toxic-appearing or diaphoretic.  HENT:     Nose: Nose normal.     Mouth/Throat:     Mouth: Mucous membranes are moist.  Eyes:     General: Visual field deficit present. No scleral  icterus.    Conjunctiva/sclera: Conjunctivae normal.  Cardiovascular:     Rate and Rhythm: Normal rate and regular rhythm.     Heart sounds: No murmur heard.    No friction rub. No gallop.  Pulmonary:     Effort: Pulmonary effort is normal.     Breath sounds: No stridor. No wheezing, rhonchi or rales.  Abdominal:     General: Abdomen is protuberant. There is no distension.     Palpations: There is no mass.     Tenderness: There is no abdominal tenderness. There is no guarding.     Hernia: No hernia is present.  Musculoskeletal:  General: Normal range of motion.     Cervical back: Neck supple.     Right lower leg: No edema.     Left lower leg: No edema.  Lymphadenopathy:     Cervical: No cervical adenopathy.  Skin:    Findings: No erythema or rash.  Neurological:     Mental Status: She is alert.     Cranial Nerves: Cranial nerve deficit, dysarthria and facial asymmetry present.     Sensory: Sensory deficit present.     Motor: Weakness, atrophy and abnormal muscle tone present. No tremor, seizure activity or pronator drift.     Coordination: Romberg sign positive. Coordination abnormal. Finger-Nose-Finger Test abnormal and Heel to [facility] Test abnormal. Impaired rapid alternating movements.     Gait: Gait abnormal.     Deep Tendon Reflexes: Reflexes abnormal.     Comments: Strength   2/5 R U and LE's 4/5 L U and LE's  Psychiatric:        Mood and Affect: Mood normal.        Behavior: Behavior normal.     Lab Results  Component Value Date   WBC 5.3 01/03/2024   HGB 12.3 01/03/2024   HCT 38.0 01/03/2024   PLT 266 01/03/2024   GLUCOSE 62 (L) 03/06/2024   CHOL 155 03/06/2024   TRIG 53.0 03/06/2024   HDL 61.80 03/06/2024   LDLCALC 83 03/06/2024   ALT 12 03/06/2024   AST 14 03/06/2024   NA 140 03/06/2024   K 4.0 03/06/2024   CL 107 03/06/2024   CREATININE 0.72 03/06/2024   BUN 13 03/06/2024   CO2 26 03/06/2024   TSH 1.02 03/06/2024    MM 3D SCREENING  MAMMOGRAM BILATERAL BREAST Result Date: 04/28/2023 CLINICAL DATA:  Screening. EXAM: DIGITAL SCREENING BILATERAL MAMMOGRAM WITH TOMOSYNTHESIS AND CAD TECHNIQUE: Bilateral screening digital craniocaudal and mediolateral oblique mammograms were obtained. Bilateral screening digital breast tomosynthesis was performed. The images were evaluated with computer-aided detection. COMPARISON:  Previous exam(s). ACR Breast Density Category b: There are scattered areas of fibroglandular density. FINDINGS: There are no findings suspicious for malignancy. IMPRESSION: No mammographic evidence of malignancy. A result letter of this screening mammogram will be mailed directly to the patient. RECOMMENDATION: Screening mammogram in one year. (Code:SM-B-01Y) BI-RADS CATEGORY  1: Negative. Electronically Signed   By: Bard Moats M.D.   On: 04/28/2023 08:19    Assessment & Plan:  Primary osteoarthritis involving multiple joints -     Basic metabolic panel with GFR; Future -     Ambulatory referral to Home Health -     Ambulatory referral to Physical Medicine Rehab  Seborrheic dermatitis of scalp -     Ketoconazole ; Apply topically 2 (two) times a week.  Dispense: 120 mL; Refill: 5 -     Clobetasol  Propionate; Apply topically 2 (two) times daily.  Dispense: 100 g; Refill: 3  Atopic dermatitis of scalp -     Clobetasol  Propionate; Apply topically 2 (two) times daily.  Dispense: 100 g; Refill: 3  Secondary progressive multiple sclerosis (HCC) -     Basic metabolic panel with GFR; Future -     Ambulatory referral to Home Health -     Ambulatory referral to Physical Medicine Rehab -     AMB Referral VBCI Care Management -     For home use only DME Wheelchair electric  Vitamin D  deficiency -     VITAMIN D  25 Hydroxy (Vit-D Deficiency, Fractures); Future  Chronic hypernatremia- Na+ is normal. -  Basic metabolic panel with GFR; Future -     TSH; Future -     Hepatic function panel; Future  Dyslipidemia, goal  LDL below 160- Statin is not indicated. -     Lipid panel; Future  Callus of foot -     Ambulatory referral to Podiatry -     For home use only DME Wheelchair electric  Encounter for general adult medical examination with abnormal findings- Exam completed, labs reviewed, vaccines reviewed, no cancer screenings indicated, pt ed material was given.      Follow-up: Return in about 6 months (around 09/06/2024).  Debby Molt, MD

## 2024-03-07 ENCOUNTER — Ambulatory Visit: Payer: Self-pay | Admitting: Internal Medicine

## 2024-03-20 ENCOUNTER — Telehealth: Payer: Self-pay | Admitting: *Deleted

## 2024-03-20 DIAGNOSIS — G35 Multiple sclerosis: Secondary | ICD-10-CM | POA: Diagnosis not present

## 2024-03-20 NOTE — Progress Notes (Signed)
 Complex Care Management Note  Care Guide Note 03/20/2024 Name: [name] MRN: [id number] DOB: April 27, 1981  Mary Jackson is a 43 y.o. year old female who sees Joshua Debby CROME, MD for primary care. I reached out to Consepcion MARLA Forbes by phone today to offer complex care management services.  Ms. Hoog was given information about Complex Care Management services today including:   The Complex Care Management services include support from the care team which includes your Nurse Care Manager, Clinical Social Worker, or Pharmacist.  The Complex Care Management team is here to help remove barriers to the health concerns and goals most important to you. Complex Care Management services are voluntary, and the patient may decline or stop services at any time by request to their care team member.   Complex Care Management Consent Status: Patient agreed to services and verbal consent obtained.   Follow up plan:  Telephone appointment with complex care management team member scheduled for:  03/29/2024  Encounter Outcome:  Patient Scheduled  Thedford Franks, CMA [place]  [facility], [facility] Guide Direct Dial: [phone]  Fax: [phone] Website: [email or web address]

## 2024-03-21 ENCOUNTER — Encounter: Payer: Self-pay | Admitting: Podiatry

## 2024-03-21 ENCOUNTER — Ambulatory Visit (INDEPENDENT_AMBULATORY_CARE_PROVIDER_SITE_OTHER): Admitting: Podiatry

## 2024-03-21 DIAGNOSIS — L84 Corns and callosities: Secondary | ICD-10-CM | POA: Diagnosis not present

## 2024-03-21 DIAGNOSIS — L600 Ingrowing nail: Secondary | ICD-10-CM

## 2024-03-21 MED ORDER — AMMONIUM LACTATE 12 % EX CREA: 1.0000 | TOPICAL_CREAM | CUTANEOUS | 0 refills | Status: AC | PRN

## 2024-03-21 NOTE — Patient Instructions (Signed)

## 2024-03-21 NOTE — Progress Notes (Signed)
  Subjective:  Patient ID: Mary Jackson, female    DOB: March 07, 1981,   MRN: [id number]  Chief Complaint  Patient presents with   Callouses    I have calluses.  They hurt from time to time.   Nail Problem    My left big toenail throbs.  I have an ingrown toenail.    43 y.o. female presents for concern of calluses and ingrown toenail. Relates they have been present for years. She has managed to deal with the ingrowns with soaks and trimming but would like to be done with them. Also relates calluses that are painful and she has tried some creams that help but continue to come back.  She has a history of MS.  . Denies any other pedal complaints. Denies n/v/f/c.   Past Medical History:  Diagnosis Date   Depression with anxiety    Migraines    MS (multiple sclerosis) (HCC)     Objective:  Physical Exam: Vascular: DP/PT pulses 2/4 bilateral. CFT <3 seconds. Normal hair growth on digits. No edema.  Skin. No lacerations or abrasions bilateral feet. Bilateral incurvation noted of bilateral hallux. No erythema edema or purulence noted. Tender to palpation. Hyperkeratotic tissue noted sub fifth metatarsal head bilateral Musculoskeletal: MMT 5/5 bilateral lower extremities in DF, PF, Inversion and Eversion. Deceased ROM in DF of ankle joint.  Neurological: Sensation intact to light touch.   Assessment:   1. Ingrown right greater toenail   2. Ingrown left greater toenail   3. Callus of foot      Plan:  Patient was evaluated and treated and all questions answered. Discussed ingrown toenails etiology and treatment options including procedure for removal vs conservative care.  Patient requesting removal of ingrown nail today. Procedure below.  Discussed procedure and post procedure care and patient expressed understanding.  Hyperkeratotic tissue debrided as courtesy today. Ammonium lactate  sent to pharmacy  Will follow-up in 2 weeks for nail check or sooner if any problems arise.     Procedure:  Procedure: partial Nail Avulsion of bilaterally hallux bilateral nail border.  Surgeon: Asberry Failing, DPM  Pre-op Dx: Ingrown toenail without infection Post-op: Same  Place of Surgery: Office exam room.  Indications for surgery: Painful and ingrown toenail.    The patient is requesting removal of nail with  chemical matrixectomy. Risks and complications were discussed with the patient for which they understand and written consent was obtained. Under sterile conditions a total of 3 mL of  1% lidocaine  plain was infiltrated in a hallux block fashion. Once anesthetized, the skin was prepped in sterile fashion. A tourniquet was then applied. Next the bilateral aspect of hallux nail border was then sharply excised making sure to remove the entire offending nail border.  Next phenol was then applied under standard conditions to permanently destroy the matrix and copiously irrigated. Silvadene was applied. A dry sterile dressing was applied. After application of the dressing the tourniquet was removed and there is found to be an immediate capillary refill time to the digit. The patient tolerated the procedure well without any complications. Post procedure instructions were discussed the patient for which he verbally understood. Follow-up in two weeks for nail check or sooner if any problems are to arise. Discussed signs/symptoms of infection and directed to call the office immediately should any occur or go directly to the emergency room. In the meantime, encouraged to call the office with any questions, concerns, changes symptoms.   Asberry Failing, DPM

## 2024-03-29 ENCOUNTER — Other Ambulatory Visit: Payer: Self-pay

## 2024-03-29 NOTE — Patient Outreach (Addendum)
 LCSW called patient at scheduled time and spoke with patient and patient's sister. LCSW explained the reason for the referral. They explained that they are needing assistance with DME equipment and disease management for MS. LCSW informed them that it appeared that an order was put in on 03/08/2024.  LCSW scheduled a visit with RN Juana for 04/07/2024 to assist with further management of care. LCSW reviewed SDOH and mental screenings and they were negative and no reports of needs at this time. LCSW asked about care giver stress and patient cares for herself. LCSW informed them that if they have social worker needs in the future that they can reach out.  Olam Ally, MSW, LCSW [place]  [facility], [facility] Licensed Clinical Social Worker Direct Dial: [phone]

## 2024-03-30 ENCOUNTER — Telehealth: Payer: Self-pay | Admitting: *Deleted

## 2024-04-04 ENCOUNTER — Encounter: Payer: Self-pay | Admitting: Podiatry

## 2024-04-04 ENCOUNTER — Ambulatory Visit (INDEPENDENT_AMBULATORY_CARE_PROVIDER_SITE_OTHER): Admitting: Podiatry

## 2024-04-04 DIAGNOSIS — L600 Ingrowing nail: Secondary | ICD-10-CM | POA: Diagnosis not present

## 2024-04-04 DIAGNOSIS — L84 Corns and callosities: Secondary | ICD-10-CM

## 2024-04-04 NOTE — Progress Notes (Signed)
  Subjective:  Patient ID: Mary Jackson, female    DOB: 1980/10/24,   MRN: [id number]  Chief Complaint  Patient presents with   Nail Problem    They're doing better.   Callouses    The area where that callus is on my left foot hurts.    43 y.o. female presents for follow-up of bilateral hallux ingrown nail procedures. Relates doing well and has been soaking as instructed. Relates callus is hurting on her left foot.  History of MS. . Denies any other pedal complaints. Denies n/v/f/c.   Past Medical History:  Diagnosis Date   Depression with anxiety    Migraines    MS (multiple sclerosis)     Objective:  Physical Exam: Vascular: DP/PT pulses 2/4 bilateral. CFT <3 seconds. Normal hair growth on digits. No edema.  Skin. No lacerations or abrasions bilateral feet. Bilateral hallux nails doing well no signs of infection. Hyperkeratotic lesion noted sub fifth metatarsal head on left  Musculoskeletal: MMT 5/5 bilateral lower extremities in DF, PF, Inversion and Eversion. Deceased ROM in DF of ankle joint.  Neurological: Sensation intact to light touch.   Assessment:   1. Ingrown right greater toenail   2. Ingrown left greater toenail   3. Callus of foot      Plan:  Patient was evaluated and treated and all questions answered. Toe was evaluated and appears to be healing well.  May discontinue soaks and neosporin.  Hyperkeratotic lesion on left debrided as courtesy for patient.  May return to salon.  Patient to follow-up as needed.    Asberry Failing, DPM

## 2024-04-07 ENCOUNTER — Telehealth

## 2024-04-18 ENCOUNTER — Other Ambulatory Visit: Payer: Self-pay

## 2024-04-18 DIAGNOSIS — G35C0 Secondary progressive multiple sclerosis, unspecified: Secondary | ICD-10-CM

## 2024-04-18 NOTE — Patient Outreach (Unsigned)
 Complex Care Management   Visit Note  04/18/2024  Name:  Mary Jackson MRN: [id number] DOB: February 19, 1981  Situation: Referral received for Complex Care Management related to {Criteria:32550} I obtained verbal consent from {CHL AMB Patient/Caregiver:28184}.  Visit completed with {CHL AMB Patient/Caregiver:28184}  {VISIT LOCATION:32553}  Background:   Past Medical History:  Diagnosis Date   Depression with anxiety    Migraines    MS (multiple sclerosis)     Assessment: Patient Reported Symptoms:  Cognitive Cognitive Status: No symptoms reported      Neurological Neurological Review of Symptoms: No symptoms reported Neurological Management Strategies: Medication therapy, Routine screening Neurological Self-Management Outcome: 3 (uncertain)  HEENT HEENT Symptoms Reported: No symptoms reported      Cardiovascular Cardiovascular Symptoms Reported: No symptoms reported    Respiratory Respiratory Symptoms Reported: No symptoms reported    Endocrine Endocrine Symptoms Reported: No symptoms reported Is patient diabetic?: No    Gastrointestinal Gastrointestinal Symptoms Reported: No symptoms reported      Genitourinary Genitourinary Symptoms Reported: Urgency Genitourinary Management Strategies: Incontinence garment/pad (patient report every now and then wears diapers) Genitourinary Self-Management Outcome: 5 (very good)  Integumentary Integumentary Symptoms Reported: Other Other Integumentary Symptoms: patient reports dry skin and uses lotion and vaseline to moisturize. Skin Management Strategies: Routine screening Skin Self-Management Outcome: 4 (good)  Musculoskeletal Musculoskelatal Symptoms Reviewed: Limited mobility Additional Musculoskeletal Details: patient reports uses walker to get around the home, but unable to walk long distances like walking outside. would like equipment: shower chair, shower rails, handheald shower, motorized chair, ramp, physical therapy for. has  shower chair,possible may benefit for shower bench. Musculoskeletal Management Strategies: Medication therapy, Medical device, Routine screening      Psychosocial Psychosocial Symptoms Reported: No symptoms reported     Quality of Family Relationships: supportive, involved Do you feel physically threatened by others?: No    04/18/2024    PHQ2-9 Depression Screening   Little interest or pleasure in doing things    Feeling down, depressed, or hopeless    PHQ-2 - Total Score    Trouble falling or staying asleep, or sleeping too much    Feeling tired or having little energy    Poor appetite or overeating     Feeling bad about yourself - or that you are a failure or have let yourself or your family down    Trouble concentrating on things, such as reading the newspaper or watching television    Moving or speaking so slowly that other people could have noticed.  Or the opposite - being so fidgety or restless that you have been moving around a lot more than usual    Thoughts that you would be better off dead, or hurting yourself in some way    PHQ2-9 Total Score    If you checked off any problems, how difficult have these problems made it for you to do your work, take care of things at home, or get along with other people    Depression Interventions/Treatment      There were no vitals filed for this visit.  Medications Reviewed Today     Reviewed by Yudit Modesitt M, RN (Registered Nurse) on 04/18/24 at 1145  Med List Status: <None>   Medication Order Taking? Sig Documenting Provider Last Dose Status Informant  amantadine  (SYMMETREL ) 100 MG capsule [id number] Yes Take 1 capsule (100 mg total) by mouth 2 (two) times daily. Shelton, Amy, NP  Active   ammonium lactate  (AMLACTIN) 12 % cream [id number] Yes Apply  1 Application topically as needed for dry skin. Sikora, Rebecca, DPM  Active   baclofen  (LIORESAL ) 10 MG tablet [id number] Yes Take 1 tablet (10 mg total) by mouth 3 (three) times daily.  Romanski, Amy, NP  Active   Cholecalciferol (VITAMIN D3 PO) [id number] Yes Take 5,000 Units by mouth daily. [provider]  Active   clobetasol  (OLUX ) 0.05 % topical foam [id number] Yes Apply topically 2 (two) times daily. Joshua Debby CROME, MD  Active   dalfampridine  10 MG TB12 [id number] Yes TAKE 1 TABLET BY MOUTH TWICE  DAILY Sater, Charlie LABOR, MD  Active   ferrous sulfate 324 MG TBEC [id number] Yes Take 324 mg by mouth. [provider]  Active   gabapentin  (NEURONTIN ) 300 MG capsule [id number] Yes Take 1 capsule (300 mg total) by mouth every morning AND 1 capsule (300 mg total) daily with lunch AND 2 capsules (600 mg total) at bedtime. po qAM, one po qPM and two po qHS.[name] Laris, Amy, NP  Active   ibuprofen  (ADVIL ) 600 MG tablet [id number] Yes TAKE 1 TABLET BY MOUTH EVERY 8  HOURS AS NEEDED FOR MODERATE  PAIN Sater, Charlie LABOR, MD  Active   ketoconazole  (NIZORAL ) 2 % shampoo [id number] Yes Apply topically 2 (two) times a week. Joshua Debby CROME, MD  Active   ocrelizumab  (OCREVUS ) 300 MG/10ML injection [id number] Yes Inject 20 mLs (600 mg total) into the vein every 6 (six) months. Sater, Charlie LABOR, MD  Active   sertraline  (ZOLOFT ) 100 MG tablet [id number] Yes Take 100 mg by mouth daily. [provider]  Active   tolterodine  (DETROL  LA) 4 MG 24 hr capsule [id number] Yes Take 1 capsule (4 mg total) by mouth daily. Sater, Charlie LABOR, MD  Active             Recommendation:   {RECOMMENDATONS:32554}  Follow Up Plan:   Telephone follow up appointment date/time:  ***  Heddy Shutter, RN, MSN, BSN, CCM [place]  [facility], Population Health Case Manager Phone: [phone]

## 2024-04-19 NOTE — Patient Instructions (Signed)
 Visit Information  Thank you for taking time to visit with me today. Please don't hesitate to contact me if I can be of assistance to you before our next scheduled appointment.  Our next appointment is by telephone on 05/02/24 at 10:30 am Please call the care guide team at [phone] if you need to cancel or reschedule your appointment.   Following is a copy of your care plan:   Goals Addressed             This Visit's Progress    VBCI RN Care Plan       Problems:  Care Coordination needs related to DME needs: Motorized Chair, Administrator, Civil Service, Handheld shower, ramp No Advanced Directives in place  Goal: Over the next 90 days the Patient will collaborate with the care management team towards completion of advanced directives per patient discretion as evidenced by patient report and/or review of chart  continue to work with Medical illustrator and/or Social Worker to address care management and care coordination needs related to MS as evidenced by adherence to care management team scheduled appointments      Interventions:   Falls Interventions: Provided written and verbal education re: potential causes of falls and Fall prevention strategies Advised patient of importance of notifying provider of falls Screening for signs and symptoms of depression related to chronic disease state  Assessed social determinant of health barriers Evaluation of current treatment plan related to MS mobility concerns, DME needs self-management and patient's adherence to plan as established by provider. Discussed plans with patient for ongoing care management follow up and provided patient with direct contact information for care management team Care coordination: Patient has [employer] dual complete. RNCM will get patient connected with [facility] Care Manager to assist with care management needs.   Patient Self-Care Activities:  Attend all scheduled provider appointments Call provider office for new concerns or questions   Take medications as prescribed    Plan:  Telephone follow up appointment with care management team member scheduled for:  04/28/24 at 10:00 am           Please call the Suicide and Crisis Lifeline: 988 call the [place]  National Suicide Prevention Lifeline: 7657265338 or TTY: 306-341-3173 TTY 321-581-2935) to talk to a trained counselor call 1-800-273-TALK (toll free, 24 hour hotline) if you are experiencing a Mental Health or Behavioral Health Crisis or need someone to talk to.  Patient verbalizes understanding of instructions and care plan provided today and agrees to view in MyChart. Active MyChart status and patient understanding of how to access instructions and care plan via MyChart confirmed with patient.     Heddy Shutter, RN, MSN, BSN, CCM [place]  [facility], Population Health Case Manager Phone: [phone]

## 2024-04-25 ENCOUNTER — Other Ambulatory Visit: Payer: Self-pay | Admitting: General Surgery

## 2024-04-25 DIAGNOSIS — Z1231 Encounter for screening mammogram for malignant neoplasm of breast: Secondary | ICD-10-CM

## 2024-04-29 ENCOUNTER — Other Ambulatory Visit: Payer: Self-pay | Admitting: Neurology

## 2024-04-29 DIAGNOSIS — M150 Primary generalized (osteo)arthritis: Secondary | ICD-10-CM

## 2024-05-01 ENCOUNTER — Other Ambulatory Visit: Payer: Self-pay

## 2024-05-01 NOTE — Telephone Encounter (Signed)
 Last seen on 01/03/24 Follow up scheduled on 07/25/24

## 2024-05-02 ENCOUNTER — Telehealth: Payer: Self-pay

## 2024-05-02 NOTE — Patient Instructions (Signed)
 Velda K Shukla - I am sorry I was unable to reach you today for our scheduled appointment. I work with Joshua Debby CROME, MD and am calling to support your healthcare needs. Please contact me at [phone] at your earliest convenience. I look forward to speaking with you soon.   Thank you,   Heddy Shutter, RN, MSN, BSN, CCM [place]  [facility], Population Health Case Manager Phone: [phone]

## 2024-05-02 NOTE — Addendum Note (Signed)
 Addended by: Macio Kissoon M on: 05/02/2024 11:07 AM   Modules accepted: Orders

## 2024-05-03 ENCOUNTER — Other Ambulatory Visit: Payer: Self-pay | Admitting: *Deleted

## 2024-05-03 MED ORDER — GABAPENTIN 300 MG PO CAPS: ORAL_CAPSULE | ORAL | 0 refills | Status: DC

## 2024-05-03 NOTE — Telephone Encounter (Signed)
 Last seen on Dec 03, 1978 Follow up scheduled on 07/25/24

## 2024-05-05 ENCOUNTER — Telehealth: Payer: Self-pay

## 2024-05-05 NOTE — Progress Notes (Signed)
 Complex Care Management Note Care Guide Note  05/05/2024 Name: Mary Jackson MRN: [id number] DOB: March 27, 1981   Complex Care Management Outreach Attempts: An unsuccessful telephone outreach was attempted today to offer the patient information about available complex care management services.  Follow Up Plan:  Additional outreach attempts will be made to offer the patient complex care management information and services.   Encounter Outcome:  No Answer  Maylene Crocker [facility]  [facility] Guide Direct Dial: [phone]  Fax: [phone] Website: [email or web address]

## 2024-05-08 ENCOUNTER — Telehealth: Payer: Self-pay

## 2024-05-08 NOTE — Progress Notes (Signed)
 Complex Care Management Note Care Guide Note  05/08/2024 Name: [name] MRN: [id number] DOB: 08-14-1980  Mary Jackson is a 43 y.o. year old female who is a primary care patient of Joshua Debby CROME, MD . The community resource team was consulted for assistance with Transportation Needs  and Food Insecurity  SDOH screenings and interventions completed:  Yes  Social Drivers of Health From This Encounter   Food Insecurity: Food Insecurity Present (05/08/2024)   Hunger Vital Sign    Worried About Running Out of Food in the Last Year: Sometimes true    Ran Out of Food in the Last Year: Sometimes true  Housing: Low Risk  (05/08/2024)   Housing Stability Vital Sign    Unable to Pay for Housing in the Last Year: No    Number of Times Moved in the Last Year: 0    Homeless in the Last Year: No  Financial Resource Strain: Low Risk  (05/08/2024)   Overall Financial Resource Strain (CARDIA)    Difficulty of Paying Living Expenses: Not very hard  Transportation Needs: No Transportation Needs (05/08/2024)   PRAPARE - Administrator, Civil Service (Medical): No    Lack of Transportation (Non-Medical): No  Utilities: Not At Risk (05/08/2024)   Utilities    Threatened with loss of utilities: No    SDOH Interventions Today    Flowsheet Row Most Recent Value  SDOH Interventions   Food Insecurity Interventions Other (Comment)  [Mailing [employer] and [employer] application.]  Transportation Interventions Other (Comment)  [Mailing [employer] [place] application and [employer] transportation information.]     Care guide performed the following interventions: Patient provided with information about care guide support team and interviewed to confirm resource needs.  Follow Up Plan:  No further follow up planned at this time. The patient has been provided with needed resources.  Encounter Outcome:  Patient Visit Completed  Jalisha Enneking [facility]  [facility] Guide Direct Dial: [phone]  Fax: [phone] Website: [email or web address]

## 2024-05-09 ENCOUNTER — Ambulatory Visit
Admission: RE | Admit: 2024-05-09 | Discharge: 2024-05-09 | Disposition: A | Discharge status: Home / Self Care | Source: Ambulatory Visit | Attending: General Surgery | Admitting: General Surgery

## 2024-05-09 DIAGNOSIS — Z1231 Encounter for screening mammogram for malignant neoplasm of breast: Secondary | ICD-10-CM

## 2024-05-17 ENCOUNTER — Other Ambulatory Visit: Payer: Self-pay

## 2024-05-17 NOTE — Patient Instructions (Signed)
 Visit Information  Thank you for taking time to visit with me today. Please don't hesitate to contact me if I can be of assistance to you before our next scheduled appointment.  Your next care management appointment is by telephone on 05/31/24 at 1:30 pm   Please call the care guide team at [phone] if you need to cancel, schedule, or reschedule an appointment.   Please call the Suicide and Crisis Lifeline: 988 call the [place]  National Suicide Prevention Lifeline: 939-375-1493 or TTY: 2600327466 TTY 503-289-0582) to talk to a trained counselor if you are experiencing a Mental Health or Behavioral Health Crisis or need someone to talk to.  Heddy Shutter, RN, MSN, BSN, CCM [place]  [facility], Population Health Case Manager Phone: [phone]

## 2024-05-17 NOTE — Patient Outreach (Unsigned)
 Complex Care Management   Visit Note  05/17/2024  Name:  Mary Jackson MRN: [id number] DOB: [birth year]  Situation: Referral received for Complex Care Management related to MS I obtained verbal consent from Patient.  Visit completed with Patient  on the phone  Background:   Past Medical History:  Diagnosis Date   Depression with anxiety    Migraines    MS (multiple sclerosis)     Assessment: Patient Reported Symptoms:  Cognitive Cognitive Status: No symptoms reported      Neurological Neurological Review of Symptoms: Weakness [place] Neurological Symptoms/Conditions [RPT]: History of Multiple Sclerosis-mobility issues Neurological Management Strategies: Medication therapy, Medical device, Routine screening  HEENT HEENT Symptoms Reported: No symptoms reported      Cardiovascular Cardiovascular Symptoms Reported: No symptoms reported    Respiratory Respiratory Symptoms Reported: No symptoms reported    Endocrine Endocrine Symptoms Reported: No symptoms reported    Gastrointestinal Gastrointestinal Symptoms Reported: No symptoms reported      Genitourinary Genitourinary Symptoms Reported: Urgency Genitourinary Management Strategies: Incontinence garment/pad  Integumentary Integumentary Symptoms Reported: Other Other Integumentary Symptoms: callous to foot managed by podiatry    Musculoskeletal Musculoskelatal Symptoms Reviewed: Limited mobility, Weakness, Unsteady gait, Difficulty walking Additional Musculoskeletal Details: history of multiple sclerosis Musculoskeletal Management Strategies: Medication therapy, Routine screening Musculoskeletal Self-Management Outcome: 3 (uncertain) Falls in the past year?: Yes Number of falls in past year: 2 or more Was there an injury with Fall?: No Fall Risk Category Calculator: 2 Patient Fall Risk Level: Moderate Fall Risk Patient at Risk for Falls Due to: History of fall(s), Impaired balance/gait, Impaired mobility Fall risk  Follow up: Falls prevention discussed, Education provided, Falls evaluation completed  Psychosocial Psychosocial Symptoms Reported: Not assessed          There were no vitals filed for this visit.  Medications Reviewed Today     Reviewed by Mica Releford M, RN (Registered Nurse) on 05/17/24 at 1156  Med List Status: <None>   Medication Order Taking? Sig Documenting Provider Last Dose Status Informant  amantadine  (SYMMETREL ) 100 MG capsule [id number] Yes Take 1 capsule (100 mg total) by mouth 2 (two) times daily. Caudillo, Amy, NP  Active   ammonium lactate  (AMLACTIN) 12 % cream [id number] Yes Apply 1 Application topically as needed for dry skin. Sikora, Rebecca, DPM  Active   baclofen  (LIORESAL ) 10 MG tablet [id number] Yes Take 1 tablet (10 mg total) by mouth 3 (three) times daily. Shimon, Amy, NP  Active   Cholecalciferol (VITAMIN D3 PO) [id number] Yes Take 5,000 Units by mouth daily. [provider]  Active   clobetasol  (OLUX ) 0.05 % topical foam [id number] Yes Apply topically 2 (two) times daily. Joshua Debby CROME, MD  Active   dalfampridine  10 MG TB12 [id number] Yes TAKE 1 TABLET BY MOUTH TWICE  DAILY Sater, Charlie LABOR, MD  Active   ferrous sulfate 324 MG TBEC [id number] Yes Take 324 mg by mouth. [provider]  Active   gabapentin  (NEURONTIN ) 300 MG capsule [id number] Yes Take 1 capsule (300 mg total) by mouth every morning AND 1 capsule (300 mg total) daily with lunch AND 2 capsules (600 mg total) at bedtime. po qAM, one po qPM and two po qHS. Vear Charlie LABOR, MD  Active   ibuprofen  (ADVIL ) 600 MG tablet [id number] Yes TAKE 1 TABLET BY MOUTH EVERY 8  HOURS AS NEEDED FOR MODERATE  PAIN Sater, Charlie LABOR, MD  Active   ketoconazole  (NIZORAL ) 2 % shampoo [id number] Yes Apply topically 2 (two)  times a week. Joshua Debby CROME, MD  Active   ocrelizumab  (OCREVUS ) 300 MG/10ML injection [id number] Yes Inject 20 mLs (600 mg total) into the vein every 6 (six) months. Sater, Charlie LABOR, MD  Active    sertraline  (ZOLOFT ) 100 MG tablet [id number] Yes Take 100 mg by mouth daily. [provider]  Active   tolterodine  (DETROL  LA) 4 MG 24 hr capsule [id number] Yes Take 1 capsule (4 mg total) by mouth daily. Sater, Charlie LABOR, MD  Active           Recommendation:   Continue Current Plan of Care Care Coordination regarding DME equipment  Follow Up Plan:   Telephone follow up appointment date/time:  05/31/24 at 1:30 pm  Heddy Shutter, RN, MSN, BSN, CCM [place]  [facility], Population Health Case Manager Phone: [phone]

## 2024-05-21 ENCOUNTER — Other Ambulatory Visit: Payer: Self-pay

## 2024-05-21 ENCOUNTER — Emergency Department (HOSPITAL_COMMUNITY)
Admission: EM | Admit: 2024-05-21 | Discharge: 2024-05-21 | Disposition: A | Discharge status: Home / Self Care | Attending: Emergency Medicine | Admitting: Emergency Medicine

## 2024-05-21 ENCOUNTER — Encounter (HOSPITAL_COMMUNITY): Payer: Self-pay | Admitting: *Deleted

## 2024-05-21 ENCOUNTER — Emergency Department (HOSPITAL_COMMUNITY)

## 2024-05-21 DIAGNOSIS — M51369 Other intervertebral disc degeneration, lumbar region without mention of lumbar back pain or lower extremity pain: Secondary | ICD-10-CM

## 2024-05-21 DIAGNOSIS — M5126 Other intervertebral disc displacement, lumbar region: Secondary | ICD-10-CM | POA: Diagnosis not present

## 2024-05-21 DIAGNOSIS — R531 Weakness: Secondary | ICD-10-CM | POA: Insufficient documentation

## 2024-05-21 DIAGNOSIS — M549 Dorsalgia, unspecified: Secondary | ICD-10-CM | POA: Diagnosis present

## 2024-05-21 DIAGNOSIS — D649 Anemia, unspecified: Secondary | ICD-10-CM | POA: Insufficient documentation

## 2024-05-21 DIAGNOSIS — G35D Multiple sclerosis, unspecified: Secondary | ICD-10-CM

## 2024-05-21 DIAGNOSIS — M5441 Lumbago with sciatica, right side: Secondary | ICD-10-CM

## 2024-05-21 LAB — CBC
HCT: 36.9 % (ref 36.0–46.0)
Hemoglobin: 11.2 g/dL — ABNORMAL LOW (ref 12.0–15.0)
MCH: 26.4 pg (ref 26.0–34.0)
MCHC: 30.4 g/dL (ref 30.0–36.0)
MCV: 86.8 fL (ref 80.0–100.0)
Platelets: 247 K/uL (ref 150–400)
RBC: 4.25 MIL/uL (ref 3.87–5.11)
RDW: 14 % (ref 11.5–15.5)
WBC: 7.5 K/uL (ref 4.0–10.5)
nRBC: 0 % (ref 0.0–0.2)

## 2024-05-21 LAB — COMPREHENSIVE METABOLIC PANEL WITH GFR
ALT: <5 U/L (ref 0–44)
AST: 15 U/L (ref 15–41)
Albumin: 3.7 g/dL (ref 3.5–5.0)
Alkaline Phosphatase: 54 U/L (ref 38–126)
Anion gap: 8 (ref 5–15)
BUN: 10 mg/dL (ref 6–20)
CO2: 24 mmol/L (ref 22–32)
Calcium: 9.1 mg/dL (ref 8.9–10.3)
Chloride: 108 mmol/L (ref 98–111)
Creatinine, Ser: 0.67 mg/dL (ref 0.44–1.00)
GFR, Estimated: >60 mL/min (ref >60)
Glucose, Bld: 71 mg/dL (ref 70–99)
Potassium: 4.3 mmol/L (ref 3.5–5.1)
Sodium: 140 mmol/L (ref 135–145)
Total Bilirubin: 0.3 mg/dL (ref 0.0–1.2)
Total Protein: 6.5 g/dL (ref 6.5–8.1)

## 2024-05-21 LAB — URINALYSIS, ROUTINE W REFLEX MICROSCOPIC
Bilirubin Urine: NEGATIVE
Glucose, UA: NEGATIVE mg/dL
Hgb urine dipstick: NEGATIVE
Ketones, ur: NEGATIVE mg/dL
Leukocytes,Ua: NEGATIVE
Nitrite: NEGATIVE
Protein, ur: NEGATIVE mg/dL
Specific Gravity, Urine: 1.015 (ref 1.005–1.030)
pH: 7 (ref 5.0–8.0)

## 2024-05-21 LAB — CK: Total CK: 127 U/L (ref 38–234)

## 2024-05-21 MED ORDER — SODIUM CHLORIDE 0.9 % IV BOLUS
1000.0000 mL | Freq: Once | INTRAVENOUS | Status: AC
  Administered 2024-05-21: 1000 mL via INTRAVENOUS

## 2024-05-21 MED ORDER — KETOROLAC TROMETHAMINE 15 MG/ML IJ SOLN
15.0000 mg | Freq: Once | INTRAMUSCULAR | Status: AC
Start: 2024-05-21 — End: 2024-05-21
  Administered 2024-05-21: 15 mg via INTRAVENOUS
  Filled 2024-05-21: qty 1

## 2024-05-21 MED ORDER — GADOBUTROL 1 MMOL/ML IV SOLN
8.0000 mL | Freq: Once | INTRAVENOUS | Status: AC | PRN
  Administered 2024-05-21: 8 mL via INTRAVENOUS

## 2024-05-21 MED ORDER — MORPHINE SULFATE (PF) 4 MG/ML IV SOLN
4.0000 mg | Freq: Once | INTRAVENOUS | Status: AC
  Administered 2024-05-21: 4 mg via INTRAVENOUS
  Filled 2024-05-21: qty 1

## 2024-05-21 MED ORDER — METOCLOPRAMIDE HCL 5 MG/ML IJ SOLN
10.0000 mg | Freq: Once | INTRAMUSCULAR | Status: AC
  Administered 2024-05-21: 10 mg via INTRAVENOUS
  Filled 2024-05-21: qty 2

## 2024-05-21 MED ORDER — LIDOCAINE 5 % EX PTCH: 1.0000 | MEDICATED_PATCH | CUTANEOUS | 0 refills | Status: AC

## 2024-05-21 MED ORDER — OXYCODONE-ACETAMINOPHEN 5-325 MG PO TABS: 1.0000 | ORAL_TABLET | Freq: Four times a day (QID) | ORAL | 0 refills | Status: AC | PRN

## 2024-05-21 MED ORDER — PREDNISONE 50 MG PO TABS
50.0000 mg | ORAL_TABLET | Freq: Once | ORAL | Status: AC
  Administered 2024-05-21: 50 mg via ORAL
  Filled 2024-05-21: qty 1

## 2024-05-21 MED ORDER — DIPHENHYDRAMINE HCL 25 MG PO CAPS
25.0000 mg | ORAL_CAPSULE | Freq: Once | ORAL | Status: AC
  Administered 2024-05-21: 25 mg via ORAL
  Filled 2024-05-21: qty 1

## 2024-05-21 MED ORDER — PREDNISONE 50 MG PO TABS: 50.0000 mg | ORAL_TABLET | Freq: Every day | ORAL | 0 refills | Status: AC

## 2024-05-21 MED ORDER — MORPHINE SULFATE (PF) 2 MG/ML IV SOLN
2.0000 mg | Freq: Once | INTRAVENOUS | Status: AC
  Administered 2024-05-21: 2 mg via INTRAVENOUS
  Filled 2024-05-21: qty 1

## 2024-05-21 MED ORDER — KETOROLAC TROMETHAMINE 15 MG/ML IJ SOLN
15.0000 mg | Freq: Once | INTRAMUSCULAR | Status: AC
  Administered 2024-05-21: 15 mg via INTRAVENOUS
  Filled 2024-05-21: qty 1

## 2024-05-21 NOTE — ED Provider Notes (Signed)
 [place] EMERGENCY DEPARTMENT AT [facility] Provider Note   CSN: [id number] Arrival date & time: 05/21/24  1058     Patient presents with: Back Pain   Mary Jackson is a 43 y.o. female.    Back Pain  Patient is a 43 year old female with past medical history significant for MS, depression, anxiety, migraines  She presents emergency room today with complaints of 2 years of headaches, some left-sided arm and leg weakness that worsened over the past 1 week although her left upper extremity weakness seems to have improved currently.  She has issues with gait at baseline MRI in October 2021 showed multiple single and confluent T2 and FLAIR hyperintense foci and most recent MRI brain done 02/07/2023 showed progression of MS but no acute lesions.       Prior to Admission medications   Medication Sig Start Date End Date Taking? Authorizing Provider  amantadine  (SYMMETREL ) 100 MG capsule Take 1 capsule (100 mg total) by mouth 2 (two) times daily. 06/21/23   Ederer, Amy, NP  ammonium lactate  (AMLACTIN) 12 % cream Apply 1 Application topically as needed for dry skin. 03/21/24   Sikora, Rebecca, DPM  baclofen  (LIORESAL ) 10 MG tablet Take 1 tablet (10 mg total) by mouth 3 (three) times daily. 06/21/23   Camplin, Amy, NP  Cholecalciferol (VITAMIN D3 PO) Take 5,000 Units by mouth daily.    [provider]  clobetasol  (OLUX ) 0.05 % topical foam Apply topically 2 (two) times daily. 03/06/24   Joshua Debby CROME, MD  dalfampridine  10 MG TB12 TAKE 1 TABLET BY MOUTH TWICE  DAILY 03/01/24   Sater, Charlie LABOR, MD  ferrous sulfate 324 MG TBEC Take 324 mg by mouth.    [provider]  gabapentin  (NEURONTIN ) 300 MG capsule Take 1 capsule (300 mg total) by mouth every morning AND 1 capsule (300 mg total) daily with lunch AND 2 capsules (600 mg total) at bedtime. po qAM, one po qPM and two po qHS. 05/03/24   Sater, Charlie LABOR, MD  ibuprofen  (ADVIL ) 600 MG tablet TAKE 1 TABLET BY MOUTH EVERY  8  HOURS AS NEEDED FOR MODERATE  PAIN 05/01/24   Sater, Charlie LABOR, MD  ketoconazole  (NIZORAL ) 2 % shampoo Apply topically 2 (two) times a week. 03/06/24   Joshua Debby CROME, MD  ocrelizumab  (OCREVUS ) 300 MG/10ML injection Inject 20 mLs (600 mg total) into the vein every 6 (six) months. 02/23/24   Sater, Charlie LABOR, MD  sertraline  (ZOLOFT ) 100 MG tablet Take 100 mg by mouth daily.    [provider]  tolterodine  (DETROL  LA) 4 MG 24 hr capsule Take 1 capsule (4 mg total) by mouth daily. 02/01/24   Sater, Charlie LABOR, MD    Allergies: Patient has no known allergies.    Review of Systems  Musculoskeletal:  Positive for back pain.    Updated Vital Signs BP 119/60   Pulse (!) 54   Temp 98 F (36.7 C)   Resp 18   Wt 90.7 kg   LMP 05/18/2024 (Exact Date)   SpO2 100%   BMI 31.32 kg/m   Physical Exam Vitals and nursing note reviewed.  Constitutional:      General: She is not in acute distress. HENT:     Head: Normocephalic and atraumatic.     Nose: Nose normal.  Eyes:     General: No scleral icterus. Cardiovascular:     Rate and Rhythm: Normal rate and regular rhythm.     Pulses:  Normal pulses.     Heart sounds: Normal heart sounds.  Pulmonary:     Effort: Pulmonary effort is normal. No respiratory distress.     Breath sounds: No wheezing.  Abdominal:     Palpations: Abdomen is soft.     Tenderness: There is no abdominal tenderness.  Musculoskeletal:     Cervical back: Normal range of motion.     Right lower leg: No edema.     Left lower leg: No edema.  Skin:    General: Skin is warm and dry.     Capillary Refill: Capillary refill takes less than 2 seconds.  Neurological:     Mental Status: She is alert. Mental status is at baseline.     Comments: Alert and oriented to self, place, time and event.   Speech is fluent, clear without dysarthria or dysphasia.   Patient is diffusely weak in lower extremities seems to have some discomfort with moving either leg, bilateral  upper extremities seem to have normal 5/5 strength Sensation intact in upper/lower extremities   CN I not tested  CN II grossly intact visual fields bilaterally. Did not visualize posterior eye.  CN III, IV, VI PERRLA and EOMs intact bilaterally  CN V Intact sensation to sharp and light touch to the face  CN VII facial movements symmetric  CN VIII not tested  CN IX, X no uvula deviation, symmetric rise of soft palate  CN XI 5/5 SCM and trapezius strength bilaterally  CN XII Midline tongue protrusion, symmetric L/R movements   Psychiatric:        Mood and Affect: Mood normal.        Behavior: Behavior normal.     (all labs ordered are listed, but only abnormal results are displayed) Labs Reviewed  CBC - Abnormal; Notable for the following components:      Result Value   Hemoglobin 11.2 (*)    All other components within normal limits  COMPREHENSIVE METABOLIC PANEL WITH GFR  URINALYSIS, ROUTINE W REFLEX MICROSCOPIC  CK    EKG: None  Radiology: No results found.   Procedures   Medications Ordered in the ED - No data to display  Clinical Course as of 05/21/24 1509  Sun May 21, 2024  1406 2 years of headaches Some L sided weakness in arm and leg that has worsened over past 1 week.  [WF]  1443 Discussed case with Dr. Merrianne of neurology who recommended MRI brain and C-spine given her complaints of left upper extremity symptoms. [WF]  1508 Chronic MS with chronic back pain, acute L upper extremity and lower extremity weakness. Known progressed MS.  [CB]    Clinical Course User Index [CB] Beola Terrall RAMAN, PA-C [WF] Neldon Hamp RAMAN, [place]                                 Medical Decision Making Amount and/or Complexity of Data Reviewed Labs: ordered. Radiology: ordered.  Risk Prescription drug management.   This patient presents to the ED for concern of back pain, this involves a number of treatment options, and is a complaint that carries with it a moderate risk of  complications and morbidity. A differential diagnosis was considered for the patient's symptoms which is discussed below:   The emergent differential diagnosis for back pain includes but is not limited to fracture, muscle strain, cauda equina, spinal stenosis. DDD, ankylosing spondylitis, acute ligamentous injury, disk herniation,  spondylolisthesis, Epidural compression syndrome, metastatic cancer, transverse myelitis, vertebral osteomyelitis, diskitis, kidney stone, pyelonephritis, AAA, Perforated ulcer, Retrocecal appendicitis, pancreatitis, bowel obstruction, retroperitoneal hemorrhage or mass, meningitis.    Co morbidities: Discussed in HPI   Brief History:  Patient is a 44 year old female with past medical history significant for MS, depression, anxiety, migraines  She presents emergency room today with complaints of 2 years of headaches, some left-sided arm and leg weakness that worsened over the past 1 week although her left upper extremity weakness seems to have improved currently.  She has issues with gait at baseline MRI in October 2021 showed multiple single and confluent T2 and FLAIR hyperintense foci and most recent MRI brain done 02/07/2023 showed progression of MS but no acute lesions.    EMR reviewed including pt PMHx, past surgical history and past visits to ER.   See HPI for more details   Lab Tests:   I ordered and independently interpreted labs. Labs notable for CK normal urine pending, CMP and CBC unremarkable.  Slight anemia compared to last labs however not significant drop  Imaging Studies:  Patient pending MRI brain CT T and L-spine    Cardiac Monitoring:  The patient was maintained on a cardiac monitor.  I personally viewed and interpreted the cardiac monitored which showed an underlying rhythm of: NSR EKG non-ischemic   Medicines ordered:  I ordered medication including Toradol Benadryl  Reglan.  Normal saline for headache Reevaluation of the  patient after these medicines showed that the patient improved I have reviewed the patients home medicines and have made adjustments as needed   Critical Interventions:     Consults/Attending Physician   I discussed this case with my attending physician who cosigned this note including patient's presenting symptoms, physical exam, and planned diagnostics and interventions. Attending physician stated agreement with plan or made changes to plan which were implemented.   Reevaluation:  After the interventions noted above I re-evaluated patient and found that they have :improved   Social Determinants of Health:      Problem List / ED Course:  Patient with history of MS here with new weakness she endorses left upper extremity and left lower extremity weakness but on my exam has symmetric upper extremity normal strength.  Does have seem to have decreased mobility of the lower extremities.  Discussed with neurology will MRI brain and entire spine.  Patient care handed off to oncoming team will follow-up   Dispostion:    Final diagnoses:  None    ED Discharge Orders     None          Neldon Hamp RAMAN, [place] 05/21/24 1549    Geraldene Hamilton, MD 05/23/24 1325

## 2024-05-21 NOTE — ED Notes (Signed)
 Patient transported to MRI

## 2024-05-21 NOTE — ED Notes (Signed)
 Pt returned from MRI

## 2024-05-21 NOTE — ED Provider Notes (Signed)
  Physical Exam  BP 118/81   Pulse 72   Temp 98 F (36.7 C)   Resp 17   Wt 90.7 kg   LMP 05/18/2024 (Exact Date)   SpO2 100%   BMI 31.32 kg/m   Physical Exam Vitals and nursing note reviewed.  Constitutional:      General: She is not in acute distress.    Appearance: Normal appearance. She is not ill-appearing.  HENT:     Head: Normocephalic and atraumatic.  Eyes:     Conjunctiva/sclera: Conjunctivae normal.  Cardiovascular:     Rate and Rhythm: Normal rate.  Pulmonary:     Effort: Pulmonary effort is normal. No respiratory distress.  Skin:    General: Skin is warm and dry.  Neurological:     General: No focal deficit present.     Mental Status: She is alert and oriented to person, place, and time. Mental status is at baseline.     Procedures  Procedures  ED Course / MDM   Clinical Course as of 05/21/24 2052  [place] May 21, 2024  1406 2 years of headaches Some L sided weakness in arm and leg that has worsened over past 1 week.  [WF]  1443 Discussed case with Dr. Merrianne of neurology who recommended MRI brain and C-spine given her complaints of left upper extremity symptoms. [WF]  1508 Chronic MS with chronic back pain, acute L upper extremity and lower extremity weakness. Known progressed MS.  [CB]    Clinical Course User Index [CB] Beola Terrall RAMAN, PA-C [WF] Neldon Hamp RAMAN, [place]   Medical Decision Making Amount and/or Complexity of Data Reviewed Labs: ordered. Radiology: ordered.  Risk Prescription drug management.   See previous provider note.  Patient care transferred over from [place], [place].  At time of handoff, awaiting MRIs.  Patient presented to ED today with chronic neck, presenting with 1 week of left upper extremity and left lower extremity progressive weakness and low back pain.  However noted and he had a symmetric strength, normal with good upper extremities.  Did have decreased mobility in his lower extremities bilaterally.  Last seen  on 01/03/2023 by Dr. Vear with neurology, noted to have R foot drop, R>L weakness.  MRI on 02/07/2023 showed progression of MS with no acute lesions.  Provided additional pain medications for her to undergo MRI.  MRI scan shows L4-L5 disc bulge as well as chronic findings.  Spoke with Dr. Vanessa with neurology who recommended sending her home on 7-day course of 50 mg prednisone once a day.  Continue follow-up with neurology as well as sent home with short course of pain medication to use as needed.  Patient vital signs have remained stable throughout the course of patient's time in the ED. Low suspicion for any other emergent pathology at this time. I believe this patient is safe to be discharged. Provided strict return to ER precautions. Patient expressed agreement and understanding of plan. All questions were answered.      Beola Terrall RAMAN DEVONNA 05/21/24 2052    Patt Alm Macho, MD 05/21/24 [phone]

## 2024-05-21 NOTE — Discharge Instructions (Addendum)
 You are seen today for low back pain and.  Your MRIs today did not show any acute changes to your MS but did show a disc bulge at L4-L5.  This is likely causing some of the symptoms you experienced today with low back pain.  Recommend continue to follow-up with neurology as well as take prednisone for the next 6 days.  I am also sending some Percocet for you to use as needed.  Uses only in instances of extreme pain.  Using lidocaine  patches and Tylenol  for main pain medications.  Take Tylenol  (acetominophen)  650mg  every 4-6 hours, as needed for pain or fever. Do not take more than 4,000 mg in a 24-hour period. As this may cause liver damage. While this is rare, if you begin to develop yellowing of the skin or eyes, stop taking and return to ER immediately.  Return to the ED for any new or worsening symptoms.

## 2024-05-21 NOTE — ED Notes (Signed)
 Pt unable to tolerate MRI, brought back to ED- order for pain med requested

## 2024-05-21 NOTE — ED Triage Notes (Signed)
 BIB [facility] from home for sciatica-like L lower back pain radiating down leg. Denies fall or injury. Onset 11/1, worse yesterday. H/o MS. Denies sx other than pain. Mentions some incontinence r/t MS. VSS. Sister present in her home. Stroke scale negative. A&Ox4. LMP Thursday and continues. EMS mentioned odor of UTI. Pt alert, NAD, calm, interactive, resps e/u, speaking in clear complete sentences, skin W&D.

## 2024-05-21 NOTE — ED Notes (Addendum)
 Attempted to transfer patient to wheelchair to use restroom for urine sample, said she couldn't stand, didn't want a bedpan. Nurse informed.

## 2024-05-24 ENCOUNTER — Other Ambulatory Visit: Payer: Self-pay

## 2024-05-24 MED ORDER — AMANTADINE HCL 100 MG PO CAPS: 100.0000 mg | ORAL_CAPSULE | Freq: Two times a day (BID) | ORAL | 3 refills | Status: DC

## 2024-05-24 NOTE — Telephone Encounter (Signed)
 Last filled by patient : 03/06/24 as a 100 day supply  last prescibed 06/21/23  Last office visit : 01/03/24 Next office visit : 07/25/24 medication is not listed in last office note to continue please advise.

## 2024-05-25 ENCOUNTER — Telehealth

## 2024-05-31 ENCOUNTER — Other Ambulatory Visit: Payer: Self-pay

## 2024-05-31 DIAGNOSIS — G35C0 Secondary progressive multiple sclerosis, unspecified: Secondary | ICD-10-CM

## 2024-05-31 NOTE — Patient Outreach (Signed)
 Complex Care Management   Visit Note  05/31/2024  Name:  Mary Jackson MRN: [id number] DOB: 06/19/1981  Situation: Referral received for Complex Care Management related to MS I obtained verbal consent from Patient.  Visit completed with Patient  on the phone  Background:   Past Medical History:  Diagnosis Date   Depression with anxiety    Migraines    MS (multiple sclerosis)     Assessment: Patient Reported Symptoms:  Cognitive Cognitive Status: No symptoms reported      Neurological Neurological Review of Symptoms: Weakness    HEENT HEENT Symptoms Reported: No symptoms reported      Cardiovascular Cardiovascular Symptoms Reported: No symptoms reported    Respiratory Respiratory Symptoms Reported: No symptoms reported    Endocrine Endocrine Symptoms Reported: No symptoms reported    Gastrointestinal Gastrointestinal Symptoms Reported: No symptoms reported      Genitourinary Genitourinary Symptoms Reported: Urgency    Integumentary Integumentary Symptoms Reported: No symptoms reported    Musculoskeletal Musculoskelatal Symptoms Reviewed: Limited mobility, Weakness, Difficulty walking Additional Musculoskeletal Details: patient reports uses rollator walker to get around        Psychosocial Psychosocial Symptoms Reported: No symptoms reported         There were no vitals filed for this visit. Pain Score: 0-No pain  Medications Reviewed Today     Reviewed by Napoleon Monacelli M, RN (Registered Nurse) on 05/31/24 at 1435  Med List Status: <None>   Medication Order Taking? Sig Documenting Provider Last Dose Status Informant  amantadine  (SYMMETREL ) 100 MG capsule [id number] Yes Take 1 capsule (100 mg total) by mouth 2 (two) times daily. Sater, Charlie LABOR, MD  Active   ammonium lactate  (AMLACTIN) 12 % cream [id number] Yes Apply 1 Application topically as needed for dry skin. Sikora, Rebecca, DPM  Active   baclofen  (LIORESAL ) 10 MG tablet [id number] Yes Take 1 tablet  (10 mg total) by mouth 3 (three) times daily. Critcher, Amy, NP  Active   Cholecalciferol (VITAMIN D3 PO) [id number] Yes Take 5,000 Units by mouth daily. [provider]  Active   clobetasol  (OLUX ) 0.05 % topical foam [id number] Yes Apply topically 2 (two) times daily. Joshua Debby CROME, MD  Active   dalfampridine  10 MG TB12 [id number] Yes TAKE 1 TABLET BY MOUTH TWICE  DAILY Sater, Charlie LABOR, MD  Active   ferrous sulfate 324 MG TBEC [id number] Yes Take 324 mg by mouth. [provider]  Active   gabapentin  (NEURONTIN ) 300 MG capsule [id number] Yes Take 1 capsule (300 mg total) by mouth every morning AND 1 capsule (300 mg total) daily with lunch AND 2 capsules (600 mg total) at bedtime. po qAM, one po qPM and two po qHS. Vear Charlie LABOR, MD  Active   ibuprofen  (ADVIL ) 600 MG tablet [id number] Yes TAKE 1 TABLET BY MOUTH EVERY 8  HOURS AS NEEDED FOR MODERATE  PAIN Sater, Charlie LABOR, MD  Active   ketoconazole  (NIZORAL ) 2 % shampoo [id number] Yes Apply topically 2 (two) times a week. Joshua Debby CROME, MD  Active   lidocaine  (LIDODERM ) 5 % [id number] Yes Place 1 patch onto the skin daily. Remove & Discard patch within 12 hours or as directed by MD Beola Terrall RAMAN, PA-C  Active   ocrelizumab  (OCREVUS ) 300 MG/10ML injection [id number] Yes Inject 20 mLs (600 mg total) into the vein every 6 (six) months. Sater, Charlie LABOR, MD  Active   oxyCODONE -acetaminophen  (PERCOCET/ROXICET) 5-325 MG tablet [id number] Yes Take 1 tablet by mouth every  6 (six) hours as needed for severe pain (pain score 7-10). Beola Terrall RAMAN, PA-C  Active   sertraline  (ZOLOFT ) 100 MG tablet [id number] Yes Take 100 mg by mouth daily. [provider]  Active   tolterodine  (DETROL  LA) 4 MG 24 hr capsule [id number] Yes Take 1 capsule (4 mg total) by mouth daily. Sater, Charlie LABOR, MD  Active           Recommendation:   Continue Current Plan of Care  Follow Up Plan:   Telephone follow up appointment date/time:  06/26/24 at 12 pm  Heddy Shutter, RN, MSN, BSN, CCM [place]  [facility], Population Health Case Manager Phone: [phone]

## 2024-05-31 NOTE — Patient Instructions (Signed)
 Visit Information  Thank you for taking time to visit with me today. Please don't hesitate to contact me if I can be of assistance to you before our next scheduled appointment.  Your next care management appointment is by telephone on 06/26/24 at 12:00 pm  Please call the care guide team at [phone] if you need to cancel, schedule, or reschedule an appointment.   Please call the Suicide and Crisis Lifeline: 988 call the [place]  National Suicide Prevention Lifeline: 564-585-1276 or TTY: 540-500-1003 TTY 478 128 2911) to talk to a trained counselor if you are experiencing a Mental Health or Behavioral Health Crisis or need someone to talk to.  Heddy Shutter, RN, MSN, BSN, CCM [place]  [facility], Population Health Case Manager Phone: [phone]

## 2024-06-01 NOTE — Addendum Note (Signed)
 Addended by: Dagmawi Venable M on: 06/01/2024 01:14 PM   Modules accepted: Orders

## 2024-06-02 ENCOUNTER — Other Ambulatory Visit: Payer: Self-pay | Admitting: Internal Medicine

## 2024-06-02 ENCOUNTER — Other Ambulatory Visit: Payer: Self-pay

## 2024-06-02 DIAGNOSIS — G35C0 Secondary progressive multiple sclerosis, unspecified: Secondary | ICD-10-CM

## 2024-06-02 NOTE — Patient Outreach (Signed)
 Social Drivers of Health  Community Resource and Care Coordination Visit Note   06/02/2024  Name: Mary Jackson MRN: [id number] DOB:1981/04/26  Situation: Referral received for [facility] needs assessment and assistance related to Transportation DME Equipment . I obtained verbal consent from Caregiver.  Visit completed with Caregiver on the phone.   Background:      Assessment:   Goals Addressed             This Visit's Progress    BSW goals       Current SDOH Barriers:  Transportation Lack of DME Equipment   Interventions: Patient interviewed and appropriate screenings performed Referred patient to community resources  Referral for DME equipment was sent to PCP          Recommendation:   attend all scheduled provider appointments Reach out to resources provided  Follow Up Plan:   Telephone follow-up 04/20/2024 at 10am   Orlean Fey, BSW [place]  [facility] Social Worker, [employer] [facility] [phone]

## 2024-06-02 NOTE — Patient Instructions (Signed)
 Visit Information  Thank you for taking time to visit with me today. Please don't hesitate to contact me if I can be of assistance to you before our next scheduled appointment.  Our next appointment is by telephone on 04/20/2024 at 10am Please call the care guide team at [phone] if you need to cancel or reschedule your appointment.   Following is a copy of your care plan:   Goals Addressed             This Visit's Progress    BSW goals       Current SDOH Barriers:  Transportation Lack of DME Equipment   Interventions: Patient interviewed and appropriate screenings performed Referred patient to community resources  Referral for DME equipment was sent to PCP          Please call the Suicide and Crisis Lifeline: 988 call the [place]  National Suicide Prevention Lifeline: 848-125-9821 or TTY: 218-187-3768 TTY 610 198 6990) to talk to a trained counselor call 1-800-273-TALK (toll free, 24 hour hotline) go to [facility] [address] [place] [phone]) call 911 if you are experiencing a Mental Health or Behavioral Health Crisis or need someone to talk to.  Caregiver verbalized understanding of Care plan and visit instructions communicated this visit  Orlean Fey, BSW [facility]  [facility] Social Worker, [facility] [phone]

## 2024-06-04 ENCOUNTER — Other Ambulatory Visit: Payer: Self-pay | Admitting: Internal Medicine

## 2024-06-04 DIAGNOSIS — G35C0 Secondary progressive multiple sclerosis, unspecified: Secondary | ICD-10-CM

## 2024-06-06 ENCOUNTER — Other Ambulatory Visit: Payer: Self-pay

## 2024-06-06 MED ORDER — AMANTADINE HCL 100 MG PO CAPS: 100.0000 mg | ORAL_CAPSULE | Freq: Two times a day (BID) | ORAL | 3 refills | Status: AC

## 2024-06-20 ENCOUNTER — Ambulatory Visit: Admitting: Internal Medicine

## 2024-06-20 ENCOUNTER — Other Ambulatory Visit: Payer: Self-pay

## 2024-06-20 ENCOUNTER — Encounter: Payer: Self-pay | Admitting: Internal Medicine

## 2024-06-20 VITALS — BP 110/84 | HR 66 | Temp 97.7°F | Ht 67.0 in | Wt 195.8 lb

## 2024-06-20 DIAGNOSIS — Z23 Encounter for immunization: Secondary | ICD-10-CM

## 2024-06-20 DIAGNOSIS — L84 Corns and callosities: Secondary | ICD-10-CM

## 2024-06-20 DIAGNOSIS — G35C0 Secondary progressive multiple sclerosis, unspecified: Secondary | ICD-10-CM

## 2024-06-20 MED ORDER — COVID-19 MRNA VAC-TRIS(PFIZER) 30 MCG/0.3ML IM SUSY: 0.3000 mL | PREFILLED_SYRINGE | Freq: Once | INTRAMUSCULAR | 0 refills | Status: AC

## 2024-06-20 MED ORDER — GENTAMICIN SULFATE 0.1 % EX CREA: 1.0000 | TOPICAL_CREAM | Freq: Three times a day (TID) | CUTANEOUS | 5 refills | Status: AC

## 2024-06-20 NOTE — Patient Instructions (Signed)

## 2024-06-20 NOTE — Patient Outreach (Signed)
 Social Drivers of Health  Community Resource and Care Coordination Visit Note   06/20/2024  Name: NORMAJEAN NASH MRN: [id number] DOB:02-Sep-1980  Situation: Referral received for [facility] needs assessment and assistance related to DME Equipment. I obtained verbal consent from Caregiver.  Visit completed with caregiver on the phone.   Background:   BSW has submitted request to PCP for DME equipment and PCP has fulfilled request. BSW has recommended patient to follow up with DME agency to see the status of equipment.  Assessment:   Goals Addressed             This Visit's Progress    COMPLETED: BSW goals       Current SDOH Barriers:  Transportation Lack of DME Equipment   Interventions: Patient interviewed and appropriate screenings performed Referred patient to community resources  Referral for DME equipment was sent to PCP          Recommendation:   attend all scheduled provider appointments call for transportation assistance at least one week before appointments ask for help if you don't understand your health insurance benefits  Follow Up Plan:   Patient has achieved all patient stated goals. [employer] will be closed. Patient has been provided contact information should new needs arise.   Orlean Fey, BSW   [facility] Social Worker, [facility] [phone]

## 2024-06-20 NOTE — Patient Instructions (Signed)
Multiple Sclerosis Multiple sclerosis (MS) is a disease of the brain, spinal cord, and optic nerves (central nervous system). It causes the body's disease-fighting system (immunesystem) to destroy the protective covering around nerves in the brain (myelin sheath). When this happens, signals (nerve impulses) going to and from the brain and spinal cord do not get sent properly or may not get sent at all. There are several types of MS: Relapsing-remitting MS. This is the most common type. This causes sudden attacks of symptoms. After an attack, you may recover completely until the next attack, or some symptoms may remain permanently. Secondary progressive MS. This usually develops after the onset of relapsing-remitting MS. Similar to relapsing-remitting MS, this type also causes sudden attacks of symptoms. Attacks may be less frequent, but symptoms slowly get worse over time. Primary progressive MS. This causes symptoms that steadily progress over time. This type of MS does not cause sudden attacks of symptoms. The age of onset of MS varies, but it often develops between 20 and 40 years of age. MS is a lifelong (chronic) condition. There is no cure, but treatment can help slow down the progression of the disease. What are the causes? The cause of this condition is not known. What increases the risk? You are more likely to develop this condition if: You are a woman. You have a relative with MS. However, the condition is not passed from parent to child (inherited). You have a lack (deficiency) of vitamin D. You smoke. MS is more common in the [place] than in the [place]. What are the signs or symptoms? Relapsing-remitting and secondary progressive MS cause symptoms to occur in episodes or attacks that may last weeks to months. There may be long periods between attacks in which there are almost no symptoms. Primary progressive MS causes symptoms to steadily progress after  they develop. Symptoms of MS vary because of the many different ways it affects the central nervous system. The main symptoms include: Vision problems and eye pain. Numbness and weakness. Inability to move your arms, hands, feet, or legs (paralysis). Balance problems. Shaking that you cannot control (tremors). Sudden muscle tightening (spasms). Problems with thinking (cognitive changes). MS can also cause symptoms that are associated with the disease but are not always the direct result of an MS attack. They may include: Inability to control when you urinate or have bowel movements (incontinence). Headaches. Fatigue. Inability to tolerate heat. Emotional changes. Depression. Pain. How is this diagnosed? This condition is diagnosed based on: Your symptoms. A neurological exam. This involves checking your central nervous system function, such as nerve function, reflexes, and coordination. MRIs of the brain and spinal cord. Lab tests, including a lumbar puncture that tests the fluid that surrounds the brain and spinal cord (cerebrospinal fluid). Tests to measure the electrical activity of the brain in response to stimulation (evoked potentials). How is this treated? There is no cure for MS, but medicines can help decrease the number and frequency of attacks and help relieve nuisance symptoms. Treatment options may include: Medicines that: Reduce the frequency of attacks. These medicines may be given by injection, by mouth (orally), or through an IV. Reduce inflammation (steroids). These may provide short-term relief of symptoms. Help control pain, depression, fatigue, or incontinence. Nutritional counseling. Eating a healthy, balanced diet can help with symptoms. Taking vitamin D supplements, if you have a deficiency. Using devices to help you move around (assistive devices), such as braces, a cane, or a walker. Therapy, such   as: Physical therapy to strengthen and stretch your  muscles. Occupational therapy to help you with everyday tasks. Alternative or complementary treatments such as massage or acupuncture. Low-impact, mild exercises, such as swimming, walking, and yoga. Regular exercise can help alleviate symptoms and increase strength and balance. Follow these instructions at home: Medicines Take over-the-counter and prescription medicines only as told by your health care provider. Ask your health care provider if the medicine prescribed to you requires you to avoid driving or using machinery. Activity Use assistive devices as recommended by your physical therapist or your health care provider. Exercise as directed by your health care provider. Return to your normal activities as told by your health care provider. Ask your health care provider what activities are safe for you. General instructions Eating healthy can help manage MS symptoms. Reach out for support. Share your feelings with friends, family, or a support group. Keep all follow-up visits. This is important. Where to find more information National Multiple Sclerosis Society: www.nationalmssociety.org [employer] of Neurological Disorders and Stroke: [employer] National Center for Complementary and Integrative Health: [employer].br Contact a health care provider if: You feel depressed. You develop new pain or numbness. You have tremors. You have problems with sexual function. Get help right away if: You develop paralysis. You develop numbness. You have problems with your bladder or bowel function. You develop double vision. You lose vision in one or both eyes. You develop suicidal thoughts. You develop severe confusion. Get help right away if you feel like you may hurt yourself or others, or have thoughts about taking your own life. Go to your nearest emergency room or: Call 911. Call the National Suicide Prevention Lifeline at 250-776-1568 or 988. This is open 24 hours  a day. Text the Crisis Text Line at 7186354411. Summary Multiple sclerosis (MS) is a disease of the central nervous system that causes the body's immune system to destroy the protective covering around nerves in the brain (myelin sheath). There are 3 types of MS: relapsing-remitting, secondary progressive, and primary progressive. There is no cure for MS, but medicines can help decrease the number and frequency of attacks and help relieve nuisance symptoms. Treatment may also include physical or occupational therapy. If you develop numbness, paralysis, vision problems, or other neurological symptoms, get help right away. This information is not intended to replace advice given to you by your health care provider. Make sure you discuss any questions you have with your health care provider. Document Revised: 03/05/2021 Document Reviewed: 03/05/2021 Elsevier Patient Education  2024 [employer].

## 2024-06-20 NOTE — Progress Notes (Unsigned)
 Subjective:  Patient ID: Mary Jackson, female    DOB: Aug 22, 1980  Age: 43 y.o. MRN: [id number]  CC: Medical Management of Chronic Issues (3 month follow up )   HPI Mary Jackson presents for f/up ----  Discussed the use of AI scribe software for clinical note transcription with the patient, who gave verbal consent to proceed.  History of Present Illness Mary Jackson is a 43 year old female with multiple sclerosis who presents with mobility issues.  She experiences daily fluctuations in mobility and weakness, with occasional dizziness or lightheadedness. Symptoms vary day-to-day and are dependent on her body's condition. Her multiple sclerosis is currently being managed with treatment, and she has a follow-up appointment scheduled in March.  Recently, she visited the emergency room due to an inability to move and get out of bed. She recalls being told it may have been related to her spine or a pinched sciatic nerve. She reports improvement in this condition, stating that it is not as severe as before.  She uses gentamicin  cream on her toes, which she refers to as 'shampoo for my toes.'  No current abdominal pain and regular bowel movements, with the last one occurring last night or this morning. She does not experience constipation regularly.     Outpatient Medications Prior to Visit  Medication Sig Dispense Refill   amantadine  (SYMMETREL ) 100 MG capsule Take 1 capsule (100 mg total) by mouth 2 (two) times daily. 180 capsule 3   ammonium lactate  (AMLACTIN) 12 % cream Apply 1 Application topically as needed for dry skin. 385 g 0   baclofen  (LIORESAL ) 10 MG tablet Take 1 tablet (10 mg total) by mouth 3 (three) times daily. 270 tablet 3   Cholecalciferol (VITAMIN D3 PO) Take 5,000 Units by mouth daily.     clobetasol  (OLUX ) 0.05 % topical foam Apply topically 2 (two) times daily. 100 g 3   dalfampridine  10 MG TB12 TAKE 1 TABLET BY MOUTH TWICE  DAILY 180 tablet 1   ferrous  sulfate 324 MG TBEC Take 324 mg by mouth.     gabapentin  (NEURONTIN ) 300 MG capsule Take 1 capsule (300 mg total) by mouth every morning AND 1 capsule (300 mg total) daily with lunch AND 2 capsules (600 mg total) at bedtime. po qAM, one po qPM and two po qHS. 360 capsule 0   ibuprofen  (ADVIL ) 600 MG tablet TAKE 1 TABLET BY MOUTH EVERY 8  HOURS AS NEEDED FOR MODERATE  PAIN 300 tablet 0   ketoconazole  (NIZORAL ) 2 % shampoo Apply topically 2 (two) times a week. 120 mL 5   lidocaine  (LIDODERM ) 5 % Place 1 patch onto the skin daily. Remove & Discard patch within 12 hours or as directed by MD 30 patch 0   ocrelizumab  (OCREVUS ) 300 MG/10ML injection Inject 20 mLs (600 mg total) into the vein every 6 (six) months. 20 mL 0   oxyCODONE -acetaminophen  (PERCOCET/ROXICET) 5-325 MG tablet Take 1 tablet by mouth every 6 (six) hours as needed for severe pain (pain score 7-10). 6 tablet 0   sertraline  (ZOLOFT ) 100 MG tablet Take 100 mg by mouth daily.     tolterodine  (DETROL  LA) 4 MG 24 hr capsule Take 1 capsule (4 mg total) by mouth daily. 90 capsule 1   No facility-administered medications prior to visit.    ROS Review of Systems  Objective:  BP 110/84 (BP Location: Left Arm, Patient Position: Sitting, Cuff Size: Normal)   Pulse 66   Temp  97.7 F (36.5 C) (Oral)   Ht 5' 7 (1.702 m)   Wt 195 lb 12.8 oz (88.8 kg)   LMP 06/12/2024 (Exact Date)   SpO2 97%   BMI 30.67 kg/m   BP Readings from Last 3 Encounters:  06/20/24 110/84  05/21/24 117/73  03/06/24 106/68    Wt Readings from Last 3 Encounters:  06/20/24 195 lb 12.8 oz (88.8 kg)  05/21/24 200 lb (90.7 kg)  03/06/24 200 lb 3.2 oz (90.8 kg)    Physical Exam Cardiovascular:     Pulses:          Dorsalis pedis pulses are 1+ on the right side and 1+ on the left side.       Posterior tibial pulses are 1+ on the right side and 1+ on the left side.  Feet:     Right foot:     Skin integrity: Callus present.     Toenail Condition: Right  toenails are normal.     Left foot:     Skin integrity: Callus present.     Toenail Condition: Left toenails are normal.     Lab Results  Component Value Date   WBC 7.5 05/21/2024   HGB 11.2 (L) 05/21/2024   HCT 36.9 05/21/2024   PLT 247 05/21/2024   GLUCOSE 71 05/21/2024   CHOL 155 03/06/2024   TRIG 53.0 03/06/2024   HDL 61.80 03/06/2024   LDLCALC 83 03/06/2024   ALT <5 05/21/2024   AST 15 05/21/2024   NA 140 05/21/2024   K 4.3 05/21/2024   CL 108 05/21/2024   CREATININE 0.67 05/21/2024   BUN 10 05/21/2024   CO2 24 05/21/2024   TSH 1.02 03/06/2024    MR Brain W and Wo Contrast Result Date: 05/21/2024 EXAM: MRI BRAIN WITHOUT AND WITH CONTRAST MRI CERVICAL SPINE WITHOUT AND WITH CONTRAST 05/21/2024 07:12:09 PM TECHNIQUE: Multiplanar multisequence MRI of the brain was performed without and with the administration of 8 mL of gadobutrol  (GADAVIST ) 1 MMOL/ML intravenous contrast. Multiplanar multisequence MRI of the cervical spine was performed without and with the administration of 8 mL of gadobutrol  (GADAVIST ) 1 MMOL/ML intravenous contrast. COMPARISON: MRI head and cervical spine 08/03/2022. CLINICAL HISTORY: Progressive L sided lower extremity weakness. FINDINGS: MRI BRAIN: BRAIN AND VENTRICLES: Similar advanced white matter T2 hyperintensity, compatible with known sclerosis. No new or enhancing lesions. Age advanced cerebral atrophy. No acute infarct. No acute intracranial hemorrhage. No mass. No midline shift. No hydrocephalus. Normal flow voids. ORBITS: No acute abnormality. SINUSES AND MASTOIDS: No acute abnormality. BONES AND SOFT TISSUES: Normal bone marrow signal. No acute soft tissue abnormality. MRI CERVICAL SPINE: BONES AND ALIGNMENT: Normal alignment. Normal vertebral body heights. Marrow signal is unremarkable. No abnormal enhancement. SPINAL CORD: Extensive T2 hyperintensity within the cord compatible with chronic demyelination. No new cord lesions. No abnormal enhancement.  SOFT TISSUES: Unremarkable. DISC LEVELS: Similar mild degenerative change with small disc bulges. No significant stenosis. THORACIC SPINE: Cord: Multiple short segment T2 hyperintense lesions within the cord compatible with chronic demyelination. No convincing cord enhancement on motion limited postcontrast imaging. Vertebral bodies: No evidence of acute fracture. No abnormal bone marrow edema. Vertebral body heights are maintained. Disc levels: No significant canal or foraminal stenosis. LUMBAR SPINE: Vertebral bodies: No evidence of acute fracture. No abnormal bone marrow edema. Vertebral body heights are maintained. Cord/conus:  No abnormal signal in the conus. No abnormal enhancement of the conus or cauda equina nerve roots. Paraspinal: No acute findings. Disc levels: At L4-L5  there is disc bulge and bilateral facet arthropathy with mild bilateral foraminal stenosis. Mild bilateral subarticular recess stenosis at this level. At L5-S1, disc bulge and bilateral facet arthropathy with mild bilateral foraminal stenosis. Patent canal. Paraspinal: Degenerative perifacet edema at L4-L5 and L5-S1. IMPRESSION: 1. Similar extensive T2 hyperintensity in the white matter intracranially, compatible with known chronic demyelination. No enhancement or evidence of acute abnormality. 2. T2 hyperintensity within the cervical and thoracic cord, compatible with chronic demyelination. No new cord lesions in the cervical spine and no abnormal enhancement. 3. No significant cervical or thoracic spine stenosis. At L4-L5 and L5-S1, degenerative change with mild disc bulging, facet arthropathy, and perifacet edema. Mild bilateral foraminal stenosis at these levels. Electronically signed by: Gilmore Molt MD 05/21/2024 07:50 PM EST RP Workstation: [id number]   MR Cervical Spine W and Wo Contrast Result Date: 05/21/2024 EXAM: MRI BRAIN WITHOUT AND WITH CONTRAST MRI CERVICAL SPINE WITHOUT AND WITH CONTRAST 05/21/2024 07:12:09 PM  TECHNIQUE: Multiplanar multisequence MRI of the brain was performed without and with the administration of 8 mL of gadobutrol  (GADAVIST ) 1 MMOL/ML intravenous contrast. Multiplanar multisequence MRI of the cervical spine was performed without and with the administration of 8 mL of gadobutrol  (GADAVIST ) 1 MMOL/ML intravenous contrast. COMPARISON: MRI head and cervical spine 08/03/2022. CLINICAL HISTORY: Progressive L sided lower extremity weakness. FINDINGS: MRI BRAIN: BRAIN AND VENTRICLES: Similar advanced white matter T2 hyperintensity, compatible with known sclerosis. No new or enhancing lesions. Age advanced cerebral atrophy. No acute infarct. No acute intracranial hemorrhage. No mass. No midline shift. No hydrocephalus. Normal flow voids. ORBITS: No acute abnormality. SINUSES AND MASTOIDS: No acute abnormality. BONES AND SOFT TISSUES: Normal bone marrow signal. No acute soft tissue abnormality. MRI CERVICAL SPINE: BONES AND ALIGNMENT: Normal alignment. Normal vertebral body heights. Marrow signal is unremarkable. No abnormal enhancement. SPINAL CORD: Extensive T2 hyperintensity within the cord compatible with chronic demyelination. No new cord lesions. No abnormal enhancement. SOFT TISSUES: Unremarkable. DISC LEVELS: Similar mild degenerative change with small disc bulges. No significant stenosis. THORACIC SPINE: Cord: Multiple short segment T2 hyperintense lesions within the cord compatible with chronic demyelination. No convincing cord enhancement on motion limited postcontrast imaging. Vertebral bodies: No evidence of acute fracture. No abnormal bone marrow edema. Vertebral body heights are maintained. Disc levels: No significant canal or foraminal stenosis. LUMBAR SPINE: Vertebral bodies: No evidence of acute fracture. No abnormal bone marrow edema. Vertebral body heights are maintained. Cord/conus:  No abnormal signal in the conus. No abnormal enhancement of the conus or cauda equina nerve roots.  Paraspinal: No acute findings. Disc levels: At L4-L5 there is disc bulge and bilateral facet arthropathy with mild bilateral foraminal stenosis. Mild bilateral subarticular recess stenosis at this level. At L5-S1, disc bulge and bilateral facet arthropathy with mild bilateral foraminal stenosis. Patent canal. Paraspinal: Degenerative perifacet edema at L4-L5 and L5-S1. IMPRESSION: 1. Similar extensive T2 hyperintensity in the white matter intracranially, compatible with known chronic demyelination. No enhancement or evidence of acute abnormality. 2. T2 hyperintensity within the cervical and thoracic cord, compatible with chronic demyelination. No new cord lesions in the cervical spine and no abnormal enhancement. 3. No significant cervical or thoracic spine stenosis. At L4-L5 and L5-S1, degenerative change with mild disc bulging, facet arthropathy, and perifacet edema. Mild bilateral foraminal stenosis at these levels. Electronically signed by: Gilmore Molt MD 05/21/2024 07:50 PM EST RP Workstation: [id number]   MR THORACIC SPINE W WO CONTRAST Result Date: 05/21/2024 EXAM: MRI BRAIN WITHOUT AND WITH CONTRAST MRI CERVICAL SPINE  WITHOUT AND WITH CONTRAST 05/21/2024 07:12:09 PM TECHNIQUE: Multiplanar multisequence MRI of the brain was performed without and with the administration of 8 mL of gadobutrol  (GADAVIST ) 1 MMOL/ML intravenous contrast. Multiplanar multisequence MRI of the cervical spine was performed without and with the administration of 8 mL of gadobutrol  (GADAVIST ) 1 MMOL/ML intravenous contrast. COMPARISON: MRI head and cervical spine 08/03/2022. CLINICAL HISTORY: Progressive L sided lower extremity weakness. FINDINGS: MRI BRAIN: BRAIN AND VENTRICLES: Similar advanced white matter T2 hyperintensity, compatible with known sclerosis. No new or enhancing lesions. Age advanced cerebral atrophy. No acute infarct. No acute intracranial hemorrhage. No mass. No midline shift. No hydrocephalus. Normal flow  voids. ORBITS: No acute abnormality. SINUSES AND MASTOIDS: No acute abnormality. BONES AND SOFT TISSUES: Normal bone marrow signal. No acute soft tissue abnormality. MRI CERVICAL SPINE: BONES AND ALIGNMENT: Normal alignment. Normal vertebral body heights. Marrow signal is unremarkable. No abnormal enhancement. SPINAL CORD: Extensive T2 hyperintensity within the cord compatible with chronic demyelination. No new cord lesions. No abnormal enhancement. SOFT TISSUES: Unremarkable. DISC LEVELS: Similar mild degenerative change with small disc bulges. No significant stenosis. THORACIC SPINE: Cord: Multiple short segment T2 hyperintense lesions within the cord compatible with chronic demyelination. No convincing cord enhancement on motion limited postcontrast imaging. Vertebral bodies: No evidence of acute fracture. No abnormal bone marrow edema. Vertebral body heights are maintained. Disc levels: No significant canal or foraminal stenosis. LUMBAR SPINE: Vertebral bodies: No evidence of acute fracture. No abnormal bone marrow edema. Vertebral body heights are maintained. Cord/conus:  No abnormal signal in the conus. No abnormal enhancement of the conus or cauda equina nerve roots. Paraspinal: No acute findings. Disc levels: At L4-L5 there is disc bulge and bilateral facet arthropathy with mild bilateral foraminal stenosis. Mild bilateral subarticular recess stenosis at this level. At L5-S1, disc bulge and bilateral facet arthropathy with mild bilateral foraminal stenosis. Patent canal. Paraspinal: Degenerative perifacet edema at L4-L5 and L5-S1. IMPRESSION: 1. Similar extensive T2 hyperintensity in the white matter intracranially, compatible with known chronic demyelination. No enhancement or evidence of acute abnormality. 2. T2 hyperintensity within the cervical and thoracic cord, compatible with chronic demyelination. No new cord lesions in the cervical spine and no abnormal enhancement. 3. No significant cervical or  thoracic spine stenosis. At L4-L5 and L5-S1, degenerative change with mild disc bulging, facet arthropathy, and perifacet edema. Mild bilateral foraminal stenosis at these levels. Electronically signed by: Gilmore Molt MD 05/21/2024 07:50 PM EST RP Workstation: [id number]   MR Lumbar Spine W Wo Contrast Result Date: 05/21/2024 EXAM: MRI BRAIN WITHOUT AND WITH CONTRAST MRI CERVICAL SPINE WITHOUT AND WITH CONTRAST 05/21/2024 07:12:09 PM TECHNIQUE: Multiplanar multisequence MRI of the brain was performed without and with the administration of 8 mL of gadobutrol  (GADAVIST ) 1 MMOL/ML intravenous contrast. Multiplanar multisequence MRI of the cervical spine was performed without and with the administration of 8 mL of gadobutrol  (GADAVIST ) 1 MMOL/ML intravenous contrast. COMPARISON: MRI head and cervical spine 08/03/2022. CLINICAL HISTORY: Progressive L sided lower extremity weakness. FINDINGS: MRI BRAIN: BRAIN AND VENTRICLES: Similar advanced white matter T2 hyperintensity, compatible with known sclerosis. No new or enhancing lesions. Age advanced cerebral atrophy. No acute infarct. No acute intracranial hemorrhage. No mass. No midline shift. No hydrocephalus. Normal flow voids. ORBITS: No acute abnormality. SINUSES AND MASTOIDS: No acute abnormality. BONES AND SOFT TISSUES: Normal bone marrow signal. No acute soft tissue abnormality. MRI CERVICAL SPINE: BONES AND ALIGNMENT: Normal alignment. Normal vertebral body heights. Marrow signal is unremarkable. No abnormal enhancement. SPINAL CORD: Extensive  T2 hyperintensity within the cord compatible with chronic demyelination. No new cord lesions. No abnormal enhancement. SOFT TISSUES: Unremarkable. DISC LEVELS: Similar mild degenerative change with small disc bulges. No significant stenosis. THORACIC SPINE: Cord: Multiple short segment T2 hyperintense lesions within the cord compatible with chronic demyelination. No convincing cord enhancement on motion limited  postcontrast imaging. Vertebral bodies: No evidence of acute fracture. No abnormal bone marrow edema. Vertebral body heights are maintained. Disc levels: No significant canal or foraminal stenosis. LUMBAR SPINE: Vertebral bodies: No evidence of acute fracture. No abnormal bone marrow edema. Vertebral body heights are maintained. Cord/conus:  No abnormal signal in the conus. No abnormal enhancement of the conus or cauda equina nerve roots. Paraspinal: No acute findings. Disc levels: At L4-L5 there is disc bulge and bilateral facet arthropathy with mild bilateral foraminal stenosis. Mild bilateral subarticular recess stenosis at this level. At L5-S1, disc bulge and bilateral facet arthropathy with mild bilateral foraminal stenosis. Patent canal. Paraspinal: Degenerative perifacet edema at L4-L5 and L5-S1. IMPRESSION: 1. Similar extensive T2 hyperintensity in the white matter intracranially, compatible with known chronic demyelination. No enhancement or evidence of acute abnormality. 2. T2 hyperintensity within the cervical and thoracic cord, compatible with chronic demyelination. No new cord lesions in the cervical spine and no abnormal enhancement. 3. No significant cervical or thoracic spine stenosis. At L4-L5 and L5-S1, degenerative change with mild disc bulging, facet arthropathy, and perifacet edema. Mild bilateral foraminal stenosis at these levels. Electronically signed by: Gilmore Molt MD 05/21/2024 07:50 PM EST RP Workstation: [id number]    Assessment & Plan:  Need for immunization against influenza -     Flu vaccine trivalent PF, 6mos and older(Flulaval,Afluria,Fluarix,Fluzone)  Secondary progressive multiple sclerosis -     COVID-19 mRNA Vac-TriS(Pfizer); Inject 0.3 mLs into the muscle once for 1 dose.  Dispense: 0.3 mL; Refill: 0 -     Ambulatory referral to Home Health  Callus of foot -     Gentamicin  Sulfate; Apply 1 Application topically 3 (three) times daily.  Dispense: 30 g; Refill:  5     Follow-up: No follow-ups on file.  Debby Molt, MD

## 2024-06-26 ENCOUNTER — Other Ambulatory Visit: Payer: Self-pay

## 2024-06-26 NOTE — Patient Instructions (Signed)
 Visit Information  Thank you for taking time to visit with me today. Please don't hesitate to contact me if I can be of assistance to you before our next scheduled appointment.  Your next care management appointment is by telephone on 07/26/24 at 1:30 pm  Please call the care guide team at [phone] if you need to cancel, schedule, or reschedule an appointment.   Please call the Suicide and Crisis Lifeline: 988 call the [place]  National Suicide Prevention Lifeline: 3304710259 or TTY: (418)611-1634 TTY [phone]) to talk to a trained counselor if you are experiencing a Mental Health or Behavioral Health Crisis or need someone to talk to.  Heddy Shutter, RN, MSN, BSN, CCM [place]  [facility], Population Health Case Manager Phone: [phone]

## 2024-06-26 NOTE — Patient Outreach (Signed)
 Complex Care Management   Visit Note  06/26/2024  Name:  Mary Jackson MRN: [id number] DOB: 1980-07-24  Situation: Referral received for Complex Care Management related to MS I obtained verbal consent from Patient.  Visit completed with Patient  on the phone  Background:   Past Medical History:  Diagnosis Date   Depression with anxiety    Migraines    MS (multiple sclerosis)     Assessment: Patient Reported Symptoms:  Cognitive Cognitive Status: No symptoms reported      Neurological Neurological Review of Symptoms: No symptoms reported Oher Neurological Symptoms/Conditions [RPT]: history of multiple sclerosis    HEENT HEENT Symptoms Reported: No symptoms reported      Cardiovascular Cardiovascular Symptoms Reported: No symptoms reported Does patient have uncontrolled Hypertension?: No    Respiratory Respiratory Symptoms Reported: No symptoms reported    Endocrine Endocrine Symptoms Reported: No symptoms reported Is patient diabetic?: No    Gastrointestinal Gastrointestinal Symptoms Reported: No symptoms reported      Genitourinary Genitourinary Symptoms Reported: No symptoms reported Genitourinary Management Strategies: Incontinence garment/pad  Integumentary Integumentary Symptoms Reported: Other Other Integumentary Symptoms: dry flaky skin along hairline-patient reports hairline along the left to right side dead skin, has been going on for years. using shampoo from the doctor and getting hair done    Musculoskeletal Musculoskelatal Symptoms Reviewed: Limited mobility Additional Musculoskeletal Details: uses rollator walker        Psychosocial Psychosocial Symptoms Reported: No symptoms reported         There were no vitals filed for this visit. Pain Scale: 0-10 Pain Score: 0-No pain  Medications Reviewed Today     Reviewed by Laporshia Hogen M, RN (Registered Nurse) on 06/26/24 at 1227  Med List Status: <None>   Medication Order Taking? Sig  Documenting Provider Last Dose Status Informant  amantadine  (SYMMETREL ) 100 MG capsule [id number] Yes Take 1 capsule (100 mg total) by mouth 2 (two) times daily. Sater, Charlie LABOR, MD  Active   ammonium lactate  (AMLACTIN) 12 % cream [id number] Yes Apply 1 Application topically as needed for dry skin. Sikora, Rebecca, DPM  Active   baclofen  (LIORESAL ) 10 MG tablet [id number] Yes Take 1 tablet (10 mg total) by mouth 3 (three) times daily. Reichow, Amy, NP  Active   Cholecalciferol (VITAMIN D3 PO) [id number] Yes Take 5,000 Units by mouth daily. [provider]  Active   clobetasol  (OLUX ) 0.05 % topical foam [id number] Yes Apply topically 2 (two) times daily. Joshua Debby CROME, MD  Active   dalfampridine  10 MG TB12 [id number] Yes TAKE 1 TABLET BY MOUTH TWICE  DAILY Sater, Charlie LABOR, MD  Active   ferrous sulfate 324 MG TBEC [id number] Yes Take 324 mg by mouth. [provider]  Active   gabapentin  (NEURONTIN ) 300 MG capsule [id number] Yes Take 1 capsule (300 mg total) by mouth every morning AND 1 capsule (300 mg total) daily with lunch AND 2 capsules (600 mg total) at bedtime. po qAM, one po qPM and two po qHS. Sater, Charlie LABOR, MD  Active   gentamicin  cream (GARAMYCIN ) 0.1 % [id number] Yes Apply 1 Application topically 3 (three) times daily. Joshua Debby CROME, MD  Active   ibuprofen  (ADVIL ) 600 MG tablet [id number] Yes TAKE 1 TABLET BY MOUTH EVERY 8  HOURS AS NEEDED FOR MODERATE  PAIN Sater, Charlie LABOR, MD  Active   ketoconazole  (NIZORAL ) 2 % shampoo [id number] Yes Apply topically 2 (two) times a week. Joshua Debby CROME, MD  Active  lidocaine  (LIDODERM ) 5 % [id number] Yes Place 1 patch onto the skin daily. Remove & Discard patch within 12 hours or as directed by MD Beola Terrall RAMAN, PA-C  Active   ocrelizumab  (OCREVUS ) 300 MG/10ML injection [id number] Yes Inject 20 mLs (600 mg total) into the vein every 6 (six) months. Sater, Charlie LABOR, MD  Active   oxyCODONE -acetaminophen  (PERCOCET/ROXICET) 5-325 MG tablet  [id number] Yes Take 1 tablet by mouth every 6 (six) hours as needed for severe pain (pain score 7-10). Beola Terrall RAMAN, PA-C  Active   sertraline  (ZOLOFT ) 100 MG tablet [id number] Yes Take 100 mg by mouth daily. [provider]  Active   tolterodine  (DETROL  LA) 4 MG 24 hr capsule [id number] Yes Take 1 capsule (4 mg total) by mouth daily. Vear Charlie LABOR, MD  Active           Recommendation:   Specialty provider follow-up Neurology  Follow Up Plan:   Telephone follow up appointment date/time:  07/26/24 at 1:30 pm  Heddy Shutter, RN, MSN, BSN, CCM   [facility], Population Health Case Manager Phone: [phone]

## 2024-06-27 ENCOUNTER — Telehealth: Payer: Self-pay | Admitting: Internal Medicine

## 2024-06-27 ENCOUNTER — Other Ambulatory Visit: Payer: Self-pay | Admitting: Internal Medicine

## 2024-06-27 ENCOUNTER — Encounter: Payer: Self-pay | Admitting: Internal Medicine

## 2024-06-27 DIAGNOSIS — M150 Primary generalized (osteo)arthritis: Secondary | ICD-10-CM

## 2024-06-27 DIAGNOSIS — G35C0 Secondary progressive multiple sclerosis, unspecified: Secondary | ICD-10-CM

## 2024-06-27 NOTE — Telephone Encounter (Signed)
 Order written

## 2024-06-27 NOTE — Telephone Encounter (Unsigned)
 Copied from CRM #[id number]. Topic: Clinical - Order For Equipment >> Jun 27, 2024  9:47 AM Zy'onna H wrote: Reason for CRM: Ted called in a verbal order on behalf of the patient, during this call she also requested the following equipment be ordered for the patient:  - A 4 wheel walker with a seat   Reason?: Patients current walker does not move/push forward/can't steer.  Ted stated if this can be covered by the patients insurance, that'd be great! Callback #: [phone]

## 2024-06-27 NOTE — Telephone Encounter (Signed)
 Copied from CRM #[id number]. Topic: Clinical - Home Health Verbal Orders >> Jun 27, 2024  9:40 AM Zy'onna H wrote: Caller/Agency: Ted Rushing Number: [phone] Service Requested: Physical Therapy Frequency: 1x week for 9 weeks Any new concerns about the patient? Yes, if her insurance can provide a new 4 wheel walker with a seat - send the fax to [employer]/patients current medical supplier

## 2024-06-28 NOTE — Telephone Encounter (Signed)
This has been faxed to [employer]

## 2024-06-30 ENCOUNTER — Telehealth: Payer: Self-pay

## 2024-06-30 NOTE — Telephone Encounter (Signed)
 Copied from CRM #[id number]. Topic: Clinical - Order For Equipment >> Jun 27, 2024  9:47 AM Zy'onna H wrote: Reason for CRM: Ted called in a verbal order on behalf of the patient, during this call she also requested the following equipment be ordered for the patient:  - A 4 wheel walker with a seat   Reason?: Patients current walker does not move/push forward/can't steer.  Ted stated if this can be covered by the patients insurance, that'd be great! Callback #: [phone] >> Jun 30, 2024  2:11 PM Mercedes MATSU wrote: Jones, Caitlyn [facility]) called in on behalf of the patient. She states that she wanted to know the status of the patients rollator and where or when it would be delivered. She can be reached at [phone].

## 2024-06-30 NOTE — Telephone Encounter (Signed)
 Mary Jackson has been made aware that we sent the order to [employer] this week. She gave a verbal understanding and will reach out to them and the family.

## 2024-07-10 ENCOUNTER — Other Ambulatory Visit: Payer: Self-pay | Admitting: Neurology

## 2024-07-10 NOTE — Telephone Encounter (Signed)
 Last OV 01/03/24 Next OV 07/25/24  Last refill 05/03/24 Qty #360/0

## 2024-07-17 DIAGNOSIS — M150 Primary generalized (osteo)arthritis: Secondary | ICD-10-CM

## 2024-07-17 NOTE — Telephone Encounter (Signed)
 Last seen on 01/03/24 Follow up scheduled on 07/25/24   Did you want patient to continue Rx?

## 2024-07-20 ENCOUNTER — Other Ambulatory Visit: Payer: Self-pay | Admitting: Neurology

## 2024-07-25 ENCOUNTER — Encounter: Payer: Self-pay | Admitting: Family Medicine

## 2024-07-25 ENCOUNTER — Ambulatory Visit: Admitting: Family Medicine

## 2024-07-25 VITALS — BP 115/72 | HR 100 | Ht 67.0 in | Wt 195.6 lb

## 2024-07-25 DIAGNOSIS — G35C0 Secondary progressive multiple sclerosis, unspecified: Secondary | ICD-10-CM

## 2024-07-25 DIAGNOSIS — R269 Unspecified abnormalities of gait and mobility: Secondary | ICD-10-CM

## 2024-07-25 DIAGNOSIS — Z79899 Other long term (current) drug therapy: Secondary | ICD-10-CM

## 2024-07-25 DIAGNOSIS — M21371 Foot drop, right foot: Secondary | ICD-10-CM | POA: Diagnosis not present

## 2024-07-25 DIAGNOSIS — R260 Ataxic gait: Secondary | ICD-10-CM | POA: Diagnosis not present

## 2024-07-25 DIAGNOSIS — R3915 Urgency of urination: Secondary | ICD-10-CM

## 2024-07-25 DIAGNOSIS — E559 Vitamin D deficiency, unspecified: Secondary | ICD-10-CM

## 2024-07-25 DIAGNOSIS — F32A Depression, unspecified: Secondary | ICD-10-CM | POA: Diagnosis not present

## 2024-07-25 NOTE — Patient Instructions (Signed)
Below is our plan:  We will continue current treatment plan.   Please make sure you are staying well hydrated. I recommend 50-60 ounces daily. Well balanced diet and regular exercise encouraged. Consistent sleep schedule with 6-8 hours recommended.   Please continue follow up with care team as directed.   Follow up with Dr Sater in 6 months   You may receive a survey regarding today's visit. I encourage you to leave honest feed back as I do use this information to improve patient care. Thank you for seeing me today!    

## 2024-07-25 NOTE — Progress Notes (Signed)
 "   PATIENT: Mary Jackson DOB: 14-Mar-1981  REASON FOR VISIT: follow up HISTORY FROM: patient  Chief Complaint  Patient presents with   Follow-up    Pt in room 1. Sister in room. Here for MS follow up.     HISTORY OF PRESENT ILLNESS:  07/25/2024 ALL:  She returns for follow up for relapsing/active secondary progressive MS. She continues Ocrevus  infusions q6M. MRI brain, cervical and thoracic spine were stable 05/2024. Lumbar showed some degenerative changes but no acute concerns.   She feels that MS is stable. She was seen in the ER 05/2024 for progressive weakness and low back pain. Imaging reassuring. She was treated with prednisone  and pain meds. Symptoms resolved spontaneously.  She is working with PT.   Gait is stable with Rolator. She reports once fall since last visit. No injuries. She has bilateral lower weakness. Left foot drop, better with brace. No worsening pain. Dysesthesias was and wane. She continues amantadine  100mg  BID, gabapentin  300mg /300/600 (increased at last visit 11/2022) and baclofen  TID.   She is sleeping well. She usually gets to bed  Mood is good. She continues sertraline  per PCP and tolerating well. Sister reports more delusional thoughts at times. No significant behavioral issues.   She is taking vitamin D  daily. No changes in urinary or bowel functioning. She continues Detrol  4mg  daily. She feels it does help some. Constipated has improved. She uses a stool softener. She is drinking more water.   No double vision. She did not have cataract surgery. She was given eye drops but could not afford.   History (copied from Dr Duncan previous note):  Mary Jackson is a 44 y.o. woman with relapsing/active form of secondary progressive multiple sclerosis.   Update 01/03/2024 She is on Ocrevus  (last infusion 09/2023 and next one is September 2025).  She has tolerated it well.   She sometimes feels more tired at the end of each cycle.  Due to converting to JCV Ab  positive, she switched from Tysabri  to Ocrevus .    JCV antibody 03/25/2020 was 1.43 and 1.37 a second tine 03/2021 (middle positive)       She uses a walker due to right > left  leg weakness.  She has a right foot drop.  She denies spasticity.   She sometimes stumbles due to the foot drop and she ha fall every couple months.    Ampyra  bid helps her some.   She feels her balance is poor..   Baclofen  has helped her leg spasms and gabapentin  tid has helped her dysesthesias.      She has urinary urgency but no recent incontinence.  She had nocturia x 3 times most nights. But on tolterodine  his is less of a problem.   She reports reduced vision OS .    She rarely has diplopia.    She has a dysconjugate gaze   She is often fatigued.  She has nocturia x 3 and sometimes has trouble falling back asleep.  She also has trouble falling asleep and seldom goes to sleep before 1 am .  She does not snore.       She has had some depression, helped by sertraline .         She gets occasional spasm of left back pain.      MS History:   She was diagnosed with MS in 2008 after presenting with left leg weakness and clumsiness and a fall.  Imaging studies were performed and she was diagnosed.  She was initially placed on Copaxone but she had multiple relapses.   She was placed on Tysabri  and is now on every 6 weeks.    Imaging:  MRI's of the brain dated 4/2 2019 and 04/09/2016.  The MRI showed multiple T2/flair hyperintense foci with single and confluent lesions in the periventricular, juxtacortical and deep white matter and also is a few foci in the infratentorial white matter and apparently at the cervical medullary junction.     We do not have an MRI of the cervical spine.  MRI report from 2015 did mention that there were new lesions compared to the previous MRI.   MRI of the brain 04/29/2020 showed Multiple single and confluent T2/FLAIR hyperintense foci in the hemispheres and a few foci in the cerebellum in a pattern  configuration consistent with advanced chronic demyelinating plaque related to multiple sclerosis.  None of the foci appear to be acute and there were no changes compared to the 2019 MRI.     Generalized cortical and corpus callosum atrophy.   MRI of the cervical spine 04/29/2020 showed patchy T2 hyperintense signal throughout the cervical spinal cord.  Mild multilevel degenerative changes but no spinal stenosis or nerve root compression   MRI brain 02/01/2023 showed Extensive T2/FLAIR hyperintense signal and confluent foci in the cerebral hemispheres and discrete foci in the cerebellar hemispheres and left anteromedial thalamus in a pattern consistent with chronic demyelinating plaque associated with multiple sclerosis. None of the foci enhanced or appear to be acute. Compared to the MRI from 04/29/2020, there appear to be 1 new focus in the left frontal lobe.[name]    MRI cervical spine  02/01/2023 showed Mild spinal cord atrophy and patchy T2 hyperintense signal throughout the cervical spinal cord.  None of the changes appears to be acute and they did not appear to be progression compared to the 02/01/2023 MRI.  These are consistent with chronic demyelinating plaque associated with her multiple sclerosis.   Minimal multilevel degenerative changes not leading to spinal stenosis or nerve root compression.  This is stable compared to the 2021 MRI.   She has no FH of MS.   Parents have HTN.    REVIEW OF SYSTEMS: Out of a complete 14 system review of symptoms, the patient complains only of the following symptoms, chronic pain, numbness and tingling, imbalance and all other reviewed systems are negative.   ALLERGIES: No Known Allergies  HOME MEDICATIONS: Outpatient Medications Prior to Visit  Medication Sig Dispense Refill   amantadine  (SYMMETREL ) 100 MG capsule Take 1 capsule (100 mg total) by mouth 2 (two) times daily. 180 capsule 3   ammonium lactate  (AMLACTIN) 12 % cream Apply 1 Application topically  as needed for dry skin. 385 g 0   baclofen  (LIORESAL ) 10 MG tablet Take 1 tablet (10 mg total) by mouth 3 (three) times daily. 270 tablet 3   Cholecalciferol (VITAMIN D3 PO) Take 5,000 Units by mouth daily.     clobetasol  (OLUX ) 0.05 % topical foam Apply topically 2 (two) times daily. 100 g 3   dalfampridine  10 MG TB12 TAKE 1 TABLET BY MOUTH TWICE  DAILY 180 tablet 1   ferrous sulfate 324 MG TBEC Take 324 mg by mouth.     gabapentin  (NEURONTIN ) 300 MG capsule TAKE 1 CAPSULE BY MOUTH EVERY  MORNING AND 1 CAPSULE WITH LUNCH AND 2 CAPSULES AT BEDTIME 360 capsule 0   gentamicin  cream (GARAMYCIN ) 0.1 % Apply 1 Application topically 3 (three) times daily. 30 g 5  ibuprofen  (ADVIL ) 600 MG tablet TAKE 1 TABLET BY MOUTH EVERY 8  HOURS AS NEEDED FOR MODERATE  PAIN 300 tablet 2   ketoconazole  (NIZORAL ) 2 % shampoo Apply topically 2 (two) times a week. 120 mL 5   lidocaine  (LIDODERM ) 5 % Place 1 patch onto the skin daily. Remove & Discard patch within 12 hours or as directed by MD 30 patch 0   ocrelizumab  (OCREVUS ) 300 MG/10ML injection Inject 20 mLs (600 mg total) into the vein every 6 (six) months. 20 mL 0   oxyCODONE -acetaminophen  (PERCOCET/ROXICET) 5-325 MG tablet Take 1 tablet by mouth every 6 (six) hours as needed for severe pain (pain score 7-10). 6 tablet 0   sertraline  (ZOLOFT ) 100 MG tablet Take 100 mg by mouth daily.     tolterodine  (DETROL  LA) 4 MG 24 hr capsule Take 1 capsule (4 mg total) by mouth daily. 90 capsule 1   No facility-administered medications prior to visit.    PAST MEDICAL HISTORY: Past Medical History:  Diagnosis Date   Depression with anxiety    Migraines    MS (multiple sclerosis)     PAST SURGICAL HISTORY: Past Surgical History:  Procedure Laterality Date   BREAST BIOPSY Right 03/2021   BREAST EXCISIONAL BIOPSY Right 06/17/2021   BREAST LUMPECTOMY WITH RADIOACTIVE SEED LOCALIZATION Right 06/17/2021   Procedure: RIGHT BREAST LUMPECTOMY WITH RADIOACTIVE SEED  LOCALIZATION;  Surgeon: Curvin Deward MOULD, MD;  Location: [facility];  Service: General;  Laterality: Right;    FAMILY HISTORY: Family History  Problem Relation Age of Onset   Hypertension Sister    Diabetes Sister    Multiple sclerosis Neg Hx    Breast cancer Neg Hx     SOCIAL HISTORY: Social History   Socioeconomic History   Marital status: Single    Spouse name: Not on file   Number of children: Not on file   Years of education: Not on file   Highest education level: Not on file  Occupational History   Not on file  Tobacco Use   Smoking status: Every Day    Current packs/day: 1.00    Average packs/day: 1 pack/day for 30.0 years (30.0 ttl pk-yrs)    Types: Cigarettes    Start date: 07/13/1994   Smokeless tobacco: Never   Tobacco comments:    3-4 cigs per day  Substance and Sexual Activity   Alcohol use: Not Currently   Drug use: Yes    Types: Marijuana    Comment: occ   Sexual activity: Not Currently    Partners: Male    Birth control/protection: Abstinence  Other Topics Concern   Not on file  Social History Narrative   Right handed   Caffeine use: coffee, tea, soda   Lives with sister, Angeline   Social Drivers of Health   Tobacco Use: High Risk (07/25/2024)   Patient History    Smoking Tobacco Use: Every Day    Smokeless Tobacco Use: Never    Passive Exposure: Not on file  Financial Resource Strain: Low Risk (05/08/2024)   Overall Financial Resource Strain (CARDIA)    Difficulty of Paying Living Expenses: Not very hard  Food Insecurity: No Food Insecurity (06/02/2024)   Epic    Worried About Radiation Protection Practitioner of Food in the Last Year: Never true    [employer] of Food in the Last Year: Never true  Recent Concern: Food Insecurity - Food Insecurity Present (05/08/2024)   Epic    Worried About Radiation Protection Practitioner  of Food in the Last Year: Sometimes true    Ran Out of Food in the Last Year: Sometimes true  Transportation Needs: No Transportation Needs  (05/08/2024)   Epic    Lack of Transportation (Medical): No    Lack of Transportation (Non-Medical): No  Physical Activity: Insufficiently Active (01/13/2024)   Exercise Vital Sign    Days of Exercise per Week: 1 day    Minutes of Exercise per Session: 10 min  Stress: No Stress Concern Present (01/13/2024)   [employer] of Occupational Health - Occupational Stress Questionnaire    Feeling of Stress: Not at all  Social Connections: Socially Isolated (01/13/2024)   Social Connection and Isolation Panel    Frequency of Communication with Friends and Family: Three times a week    Frequency of Social Gatherings with Friends and Family: Once a week    Attends Religious Services: Never    Database Administrator or Organizations: No    Attends Banker Meetings: Never    Marital Status: Never married  Intimate Partner Violence: Not At Risk (04/18/2024)   Epic    Fear of Current or Ex-Partner: No    Emotionally Abused: No    Physically Abused: No    Sexually Abused: No  Depression (PHQ2-9): Low Risk (01/13/2024)   Depression (PHQ2-9)    PHQ-2 Score: 3  Alcohol Screen: Low Risk (01/13/2024)   Alcohol Screen    Last Alcohol Screening Score (AUDIT): 0  Housing: Low Risk (05/08/2024)   Epic    Unable to Pay for Housing in the Last Year: No    Number of Times Moved in the Last Year: 0    Homeless in the Last Year: No  Utilities: Not At Risk (05/08/2024)   Epic    Threatened with loss of utilities: No  Health Literacy: Adequate Health Literacy (01/13/2024)   B1300 Health Literacy    Frequency of need for help with medical instructions: Never      PHYSICAL EXAM  Vitals:   07/25/24 1031  BP: 115/72  Pulse: 100  SpO2: 96%  Weight: 195 lb 9.6 oz (88.7 kg)  Height: 5' 7 (1.702 m)      Body mass index is 30.64 kg/m.  Generalized: Well developed, in no acute distress  Cardiology: normal rate and rhythm, no murmur noted Neurological examination  Mentation: Alert  oriented to time, place, history taking. Follows all commands speech and language fluent Cranial nerve II-XII: Pupils were equal round reactive to light. Extraocular movements were full, visual field were full on confrontational test. Facial sensation and strength were normal. Head turning and shoulder shrug  were normal and symmetric. Motor: The motor testing reveals 5 over 5 strength of upper extremities, 4-/5 bilateral lowers. Good symmetric motor tone is noted throughout.  Sensory: Sensory testing is intact to soft touch on all 4 extremities. No evidence of extinction is noted.  Coordination: Cerebellar testing reveals mildly ataxic finger-nose-finger (L>R) and reduced heel-to-shin bilaterally.  Gait and station: Gait is ataxic, stable with Rolator, left leg drags, Unable to tandem Reflexes: Deep tendon reflexes are symmetric, brisk    DIAGNOSTIC DATA (LABS, IMAGING, TESTING) - I reviewed patient records, labs, notes, testing and imaging myself where available.      No data to display           Lab Results  Component Value Date   WBC 7.5 05/21/2024   HGB 11.2 (L) 05/21/2024   HCT 36.9 05/21/2024   MCV 86.8  05/21/2024   PLT 247 05/21/2024      Component Value Date/Time   NA 140 05/21/2024 1423   NA 145 (H) 11/10/2021 1402   K 4.3 05/21/2024 1423   CL 108 05/21/2024 1423   CO2 24 05/21/2024 1423   GLUCOSE 71 05/21/2024 1423   BUN 10 05/21/2024 1423   BUN 16 11/10/2021 1402   CREATININE 0.67 05/21/2024 1423   CALCIUM 9.1 05/21/2024 1423   PROT 6.5 05/21/2024 1423   PROT 7.8 11/10/2021 1402   ALBUMIN 3.7 05/21/2024 1423   ALBUMIN 4.3 11/10/2021 1402   AST 15 05/21/2024 1423   ALT <5 05/21/2024 1423   ALKPHOS 54 05/21/2024 1423   BILITOT 0.3 05/21/2024 1423   BILITOT 0.3 11/10/2021 1402   GFRNONAA >60 05/21/2024 1423   GFRAA 119 10/02/2019 1008   Lab Results  Component Value Date   CHOL 155 03/06/2024   HDL 61.80 03/06/2024   LDLCALC 83 03/06/2024   TRIG 53.0  03/06/2024   CHOLHDL 3 03/06/2024   No results found for: HGBA1C Lab Results  Component Value Date   VITAMINB12 589 12/03/2020   Lab Results  Component Value Date   TSH 1.02 03/06/2024       ASSESSMENT AND PLAN 44 y.o. year old female  has a past medical history of Depression with anxiety, Migraines, and MS (multiple sclerosis). here with     ICD-10-CM   1. Secondary progressive multiple sclerosis  G35.C0 CBC with Differential/Platelets    IgG, IgA, IgM    2. Gait disturbance  R26.9     3. High risk medication use  Z79.899     4. Right foot drop  M21.371     5. Ataxic gait  R26.0     6. Vitamin D  deficiency  E55.9 Vitamin D , 25-hydroxy    7. Urinary urgency  R39.15     8. Depression, unspecified depression type  F32.A       She is doing well today. MS is stable. She will continue Ocrevus  infusions every 6 months. Continue amantadine  for gait, gabapentin  for dysesthesias and baclofen  for muscle spasms. She will continue Detrol  for urinary frequency. PCP now filling sertraline . We will update labs today. Safety and fall precautions reviewed. She will continue to work with PT. She was encouraged to stay physically active. She will follow up in 6 months, sooner if needed.    Orders Placed This Encounter  Procedures   CBC with Differential/Platelets   IgG, IgA, IgM   Vitamin D , 25-hydroxy     No orders of the defined types were placed in this encounter.     Meg Niemeier, FNP-C 07/25/2024, 12:46 PM [facility] [address], [address] [place], [place] [address] [phone] "

## 2024-07-26 ENCOUNTER — Other Ambulatory Visit: Payer: Self-pay | Admitting: Neurology

## 2024-07-26 ENCOUNTER — Other Ambulatory Visit: Payer: Self-pay

## 2024-07-26 LAB — IGG, IGA, IGM
IgG (Immunoglobin G), Serum: 1398 mg/dL (ref 586–1602)
IgM (Immunoglobulin M), Srm: 52 mg/dL (ref 26–217)
Immunoglobulin A, (IgA) QN, Serum: 329 mg/dL (ref 87–352)

## 2024-07-26 LAB — CBC WITH DIFFERENTIAL/PLATELET
Basophils Absolute: 0 x10E3/uL (ref 0.0–0.2)
Basos: 1 %
EOS (ABSOLUTE): 0.1 x10E3/uL (ref 0.0–0.4)
Eos: 1 %
Hematocrit: 40 % (ref 34.0–46.6)
Hemoglobin: 13.1 g/dL (ref 11.1–15.9)
Immature Grans (Abs): 0 x10E3/uL (ref 0.0–0.1)
Immature Granulocytes: 0 %
Lymphocytes Absolute: 1.8 x10E3/uL (ref 0.7–3.1)
Lymphs: 21 %
MCH: 27.8 pg (ref 26.6–33.0)
MCHC: 32.8 g/dL (ref 31.5–35.7)
MCV: 85 fL (ref 79–97)
Monocytes Absolute: 0.5 x10E3/uL (ref 0.1–0.9)
Monocytes: 6 %
Neutrophils Absolute: 5.9 x10E3/uL (ref 1.4–7.0)
Neutrophils: 71 %
Platelets: 201 x10E3/uL (ref 150–450)
RBC: 4.71 x10E6/uL (ref 3.77–5.28)
RDW: 13 % (ref 11.7–15.4)
WBC: 8.3 x10E3/uL (ref 3.4–10.8)

## 2024-07-26 LAB — VITAMIN D 25 HYDROXY (VIT D DEFICIENCY, FRACTURES): Vit D, 25-Hydroxy: 53.1 ng/mL (ref 30.0–100.0)

## 2024-07-26 NOTE — Patient Instructions (Addendum)
 Visit Information  Thank you for taking time to visit with me today. Please don't hesitate to contact me if I can be of assistance to you before our next scheduled appointment.  Your next care management appointment is by telephone on 08/21/24 at 1:00 pm   Please call the care guide team at [phone] if you need to cancel, schedule, or reschedule an appointment.   Please call the Suicide and Crisis Lifeline: 988 call the [place]  National Suicide Prevention Lifeline: 920-576-6138 or TTY: (270)236-2598 TTY [phone]) to talk to a trained counselor if you are experiencing a Mental Health or Behavioral Health Crisis or need someone to talk to.  Heddy Shutter, RN, MSN, BSN, CCM [place]  [facility], Population Health Case Manager Phone: [phone]

## 2024-07-26 NOTE — Patient Outreach (Signed)
 Complex Care Management   Visit Note  07/26/2024  Name:  Mary Jackson MRN: [id number] DOB: July 09, 1981  Situation: Referral received for Complex Care Management related to MS I obtained verbal consent from Patient.  Visit completed with Patient  on the phone  Background:   Past Medical History:  Diagnosis Date   Depression with anxiety    Migraines    MS (multiple sclerosis)     Assessment: Patient Reported Symptoms:  Cognitive Cognitive Status: No symptoms reported      Neurological Neurological Review of Symptoms: No symptoms reported    HEENT HEENT Symptoms Reported: No symptoms reported      Cardiovascular Cardiovascular Symptoms Reported: No symptoms reported    Respiratory Respiratory Symptoms Reported: No symptoms reported    Endocrine Endocrine Symptoms Reported: No symptoms reported    Gastrointestinal Gastrointestinal Symptoms Reported: No symptoms reported      Genitourinary Genitourinary Symptoms Reported: No symptoms reported    Integumentary Integumentary Symptoms Reported: No symptoms reported    Musculoskeletal Musculoskelatal Symptoms Reviewed: Limited mobility Additional Musculoskeletal Details: rollator walker and active with Physical therapy        Psychosocial Psychosocial Symptoms Reported: No symptoms reported          There were no vitals filed for this visit.    Medications Reviewed Today     Reviewed by Shyquan Stallbaumer M, RN (Registered Nurse) on 07/26/24 at 1352  Med List Status: <None>   Medication Order Taking? Sig Documenting Provider Last Dose Status Informant  amantadine  (SYMMETREL ) 100 MG capsule [id number] Yes Take 1 capsule (100 mg total) by mouth 2 (two) times daily. Sater, Charlie LABOR, MD  Active   ammonium lactate  (AMLACTIN) 12 % cream [id number] Yes Apply 1 Application topically as needed for dry skin. Sikora, Rebecca, DPM  Active   baclofen  (LIORESAL ) 10 MG tablet [id number] Yes Take 1 tablet (10 mg total) by mouth 3  (three) times daily. Wile, Amy, NP  Active   Cholecalciferol (VITAMIN D3 PO) [id number] Yes Take 5,000 Units by mouth daily. [provider]  Active   clobetasol  (OLUX ) 0.05 % topical foam [id number] Yes Apply topically 2 (two) times daily. Joshua Debby CROME, MD  Active   dalfampridine  10 MG TB12 [id number] Yes TAKE 1 TABLET BY MOUTH TWICE  DAILY Sater, Charlie LABOR, MD  Active   ferrous sulfate 324 MG TBEC [id number] Yes Take 324 mg by mouth. [provider]  Active   gabapentin  (NEURONTIN ) 300 MG capsule [id number] Yes TAKE 1 CAPSULE BY MOUTH EVERY  MORNING AND 1 CAPSULE WITH LUNCH AND 2 CAPSULES AT BEDTIME Sater, Charlie LABOR, MD  Active   gentamicin  cream (GARAMYCIN ) 0.1 % [id number] Yes Apply 1 Application topically 3 (three) times daily. Joshua Debby CROME, MD  Active   ibuprofen  (ADVIL ) 600 MG tablet [id number] Yes TAKE 1 TABLET BY MOUTH EVERY 8  HOURS AS NEEDED FOR MODERATE  PAIN Sater, Charlie LABOR, MD  Active   ketoconazole  (NIZORAL ) 2 % shampoo [id number] Yes Apply topically 2 (two) times a week. Joshua Debby CROME, MD  Active   lidocaine  (LIDODERM ) 5 % [id number] Yes Place 1 patch onto the skin daily. Remove & Discard patch within 12 hours or as directed by MD Beola Terrall RAMAN, PA-C  Active   ocrelizumab  (OCREVUS ) 300 MG/10ML injection [id number] Yes Inject 20 mLs (600 mg total) into the vein every 6 (six) months. Sater, Charlie LABOR, MD  Active   oxyCODONE -acetaminophen  (PERCOCET/ROXICET) 5-325 MG tablet [id number] Yes Take 1  tablet by mouth every 6 (six) hours as needed for severe pain (pain score 7-10). Beola Terrall RAMAN, PA-C  Active   sertraline  (ZOLOFT ) 100 MG tablet [id number] Yes Take 100 mg by mouth daily. [provider]  Active   tolterodine  (DETROL  LA) 4 MG 24 hr capsule [id number] Yes Take 1 capsule (4 mg total) by mouth daily. Sater, Charlie LABOR, MD  Active           Recommendation:   Referral to: Collaboration/Coordination with BSW regarding equipment/DME needs Continue Current Plan  of Care  Follow Up Plan:   Telephone follow up appointment date/time:  08/21/24 at 1:00 pm  Heddy Shutter, RN, MSN, BSN, CCM [place]  [facility], Population Health Case Manager Phone: [phone]

## 2024-07-28 ENCOUNTER — Other Ambulatory Visit: Payer: Self-pay | Admitting: Neurology

## 2024-07-28 ENCOUNTER — Ambulatory Visit: Payer: Self-pay | Admitting: Family Medicine

## 2024-08-01 ENCOUNTER — Other Ambulatory Visit: Payer: Self-pay

## 2024-08-01 NOTE — Telephone Encounter (Signed)
 Last seen on 07/25/24 Follow up scheduled on 03/27/25  Rx just filled for 90 day supply.   Dispensed Days Supply Quantity Provider Pharmacy  GABAPENTIN   300 MG CAPS 07/14/2024 90 360 capsule Sater, Charlie LABOR, MD [facility], [place]

## 2024-08-01 NOTE — Patient Instructions (Signed)
 Visit Information  Thank you for taking time to visit with me today. Please don't hesitate to contact me if I can be of assistance to you before our next scheduled appointment.  Your next care management appointment is by telephone on 2/4 at 11am  Telephone follow-up 2/4 at 11am  Please call the care guide team at [phone] if you need to cancel, schedule, or reschedule an appointment.   Please call the Suicide and Crisis Lifeline: 988 call the [place]  National Suicide Prevention Lifeline: 917-709-4684 or TTY: (762) 610-4864 TTY (669) 730-6146) to talk to a trained counselor call 1-800-273-TALK (toll free, 24 hour hotline) go to [facility] [address] [place] [phone]) call 911 if you are experiencing a Mental Health or Behavioral Health Crisis or need someone to talk to.  Orlean Fey, BSW [place]  [facility] Social Worker, [employer] [facility] [phone]

## 2024-08-01 NOTE — Patient Outreach (Signed)
 [employer] D[employer] of Health  Community Resource and Care Coordination Visit Note   08/01/2024  Name: Mary Jackson MRN: [id number] DOB:08-02-1980  Situation: Referral received for [facility] needs assessment and assistance related to DME Equipment. I obtained verbal consent from Patient.  Visit completed with Patient on the phone.   Background:      Assessment:   BSW outreached patient to assist with DME equipment request. During the call, patient stated that she has received a walker; however, she is still in need of a shower chair with a back. Patient also expressed interest in obtaining a motorized wheelchair but stated that she does not have adequate space in her home or ramps to accommodate one at this time. BSW informed patient that a request would be submitted to Dr. Joshua office for them to place a request through the patients insurance for the shower chair with a back. Patient verbalized understanding and agreement with the plan. BSW will follow up on 2/4 at 11am   Goals Addressed             This Visit's Progress    BSW goals       Current SDOH Barriers:  Transportation Lack of DME Equipment   Interventions: Patient interviewed and appropriate screenings performed Referred patient to community resources  Referral for DME equipment was sent to PCP          Recommendation:   attend all scheduled provider appointments call for transportation assistance at least one week before appointments ask for help if you don't understand your health insurance benefits  Follow Up Plan:   Telephone follow-up on 2/4 at 11am  Orlean Fey, [facility]  [facility] Social Worker, [employer] [facility] [phone]

## 2024-08-02 ENCOUNTER — Other Ambulatory Visit: Payer: Self-pay | Admitting: Internal Medicine

## 2024-08-02 DIAGNOSIS — G35C0 Secondary progressive multiple sclerosis, unspecified: Secondary | ICD-10-CM

## 2024-08-02 DIAGNOSIS — M150 Primary generalized (osteo)arthritis: Secondary | ICD-10-CM

## 2024-08-03 ENCOUNTER — Other Ambulatory Visit: Payer: Self-pay

## 2024-08-03 MED ORDER — BACLOFEN 10 MG PO TABS: 10.0000 mg | ORAL_TABLET | Freq: Three times a day (TID) | ORAL | 3 refills | Status: AC

## 2024-08-16 ENCOUNTER — Other Ambulatory Visit: Payer: Self-pay

## 2024-08-16 NOTE — Patient Instructions (Signed)
 Visit Information  Thank you for taking time to visit with me today. Please don't hesitate to contact me if I can be of assistance to you before our next scheduled appointment.  Your next care management appointment is by telephone on 2/10 at 11am  Telephone follow-up in 1 week  Please call the care guide team at [phone] if you need to cancel, schedule, or reschedule an appointment.   Please call the Suicide and Crisis Lifeline: 988 call the [place]  National Suicide Prevention Lifeline: 985-569-0571 or TTY: 571-281-4885 TTY 386-364-2334) to talk to a trained counselor call 1-800-273-TALK (toll free, 24 hour hotline) go to [facility] [address], [place] [phone]) call 911 if you are experiencing a Mental Health or Behavioral Health Crisis or need someone to talk to.  Orlean Fey, BSW [place]  [facility] Social Worker, [employer] [facility] [phone]

## 2024-08-16 NOTE — Patient Outreach (Signed)
 Social Drivers of Health  Community Resource and Care Coordination Visit Note   08/16/2024  Name: Mary Jackson MRN: [id number] DOB:[birth year]  Situation: Referral received for [facility] needs assessment and assistance related to DME equipment. I obtained verbal consent from Patient.  Visit completed with Patient on the phone.   Background:      Assessment:   BSW outreached patient today to confirm receipt of previously requested DME equipment. Patient confirmed that she has received her shower chair and walker. During the call, patient stated that she is currently receiving physical therapy one day per week but would be interested in receiving additional PT sessions if possible. Patient also expressed interest in obtaining an electric wheelchair to assist with mobility. BSW informed patient that a request would be sent to her PCP regarding the potential increase in PT services and evaluation for an electric wheelchair. BSW will follow up with patient on February 10th to check on the status of these requests and provide updates as needed.    Goals Addressed             This Visit's Progress    BSW goals       Current SDOH Barriers:  Transportation Lack of DME Equipment   Interventions: Patient interviewed and appropriate screenings performed Referred patient to community resources  Referral for DME equipment was sent to PCP for electric wheelchair          Recommendation:   attend all scheduled provider appointments call for transportation assistance at least one week before appointments ask for help if you don't understand your health insurance benefits  Follow Up Plan:   Telephone follow-up in 1 week  Orlean Fey, BSW [facility]  [facility] Social Worker, [employer] [phone]

## 2024-08-21 ENCOUNTER — Telehealth

## 2024-08-22 ENCOUNTER — Telehealth

## 2024-08-29 ENCOUNTER — Telehealth

## 2025-01-17 ENCOUNTER — Ambulatory Visit

## 2025-03-27 ENCOUNTER — Ambulatory Visit: Admitting: Neurology
